# Patient Record
Sex: Female | Born: 1940 | Race: White | Hispanic: No | Marital: Married | Smoking: Former smoker
Health system: Southern US, Community
[De-identification: ages and names within clinical notes are randomized; demographics above are authoritative.]

## PROBLEM LIST (undated history)

## (undated) DIAGNOSIS — J449 Chronic obstructive pulmonary disease, unspecified: Secondary | ICD-10-CM

## (undated) DIAGNOSIS — J189 Pneumonia, unspecified organism: Secondary | ICD-10-CM

## (undated) DIAGNOSIS — E860 Dehydration: Secondary | ICD-10-CM

## (undated) DIAGNOSIS — Z8619 Personal history of other infectious and parasitic diseases: Secondary | ICD-10-CM

## (undated) DIAGNOSIS — I00 Rheumatic fever without heart involvement: Secondary | ICD-10-CM

## (undated) DIAGNOSIS — A4902 Methicillin resistant Staphylococcus aureus infection, unspecified site: Secondary | ICD-10-CM

## (undated) DIAGNOSIS — I82409 Acute embolism and thrombosis of unspecified deep veins of unspecified lower extremity: Secondary | ICD-10-CM

## (undated) DIAGNOSIS — E079 Disorder of thyroid, unspecified: Secondary | ICD-10-CM

## (undated) DIAGNOSIS — Z22322 Carrier or suspected carrier of Methicillin resistant Staphylococcus aureus: Secondary | ICD-10-CM

## (undated) DIAGNOSIS — C189 Malignant neoplasm of colon, unspecified: Secondary | ICD-10-CM

## (undated) DIAGNOSIS — M1990 Unspecified osteoarthritis, unspecified site: Secondary | ICD-10-CM

## (undated) DIAGNOSIS — I2699 Other pulmonary embolism without acute cor pulmonale: Secondary | ICD-10-CM

## (undated) DIAGNOSIS — I10 Essential (primary) hypertension: Secondary | ICD-10-CM

## (undated) HISTORY — DX: Carrier or suspected carrier of methicillin resistant Staphylococcus aureus: Z22.322

## (undated) HISTORY — PX: CHOLECYSTECTOMY: SHX55

## (undated) HISTORY — DX: Rheumatic fever without heart involvement: I00

## (undated) HISTORY — PX: PARTIAL HYSTERECTOMY: SHX80

## (undated) HISTORY — DX: Methicillin resistant Staphylococcus aureus infection, unspecified site: A49.02

## (undated) HISTORY — DX: Acute embolism and thrombosis of unspecified deep veins of unspecified lower extremity: I82.409

## (undated) HISTORY — PX: BACK SURGERY: SHX140

## (undated) HISTORY — PX: OTHER SURGICAL HISTORY: SHX169

## (undated) HISTORY — DX: Personal history of other infectious and parasitic diseases: Z86.19

## (undated) HISTORY — PX: PORT-A-CATH REMOVAL: SHX5289

## (undated) HISTORY — DX: Malignant neoplasm of colon, unspecified: C18.9

## (undated) HISTORY — DX: Other pulmonary embolism without acute cor pulmonale: I26.99

## (undated) HISTORY — PX: BREAST CYST EXCISION: SHX579

## (undated) HISTORY — PX: ARTHROSCOPIC REPAIR ACL: SUR80

## (undated) HISTORY — DX: Chronic obstructive pulmonary disease, unspecified: J44.9

## (undated) HISTORY — DX: Disorder of thyroid, unspecified: E07.9

## (undated) HISTORY — PX: ABCESS DRAINAGE: SHX399

## (undated) HISTORY — DX: Pneumonia, unspecified organism: J18.9

## (undated) HISTORY — PX: GALLBLADDER SURGERY: SHX652

## (undated) HISTORY — DX: Dehydration: E86.0

## (undated) HISTORY — DX: Essential (primary) hypertension: I10

## (undated) HISTORY — DX: Unspecified osteoarthritis, unspecified site: M19.90

## (undated) HISTORY — PX: BILE DUCT EXPLORATION: SHX1225

## (undated) HISTORY — PX: HERNIA REPAIR: SHX51

## (undated) HISTORY — PX: COLOSTOMY TAKEDOWN: SHX5258

---

## 2000-08-26 ENCOUNTER — Encounter: Payer: Self-pay | Admitting: Orthopaedic Surgery

## 2000-08-26 ENCOUNTER — Ambulatory Visit (HOSPITAL_COMMUNITY): Admission: RE | Admit: 2000-08-26 | Discharge: 2000-08-26 | Payer: Self-pay | Admitting: Orthopaedic Surgery

## 2001-03-15 ENCOUNTER — Ambulatory Visit (HOSPITAL_COMMUNITY): Admission: RE | Admit: 2001-03-15 | Discharge: 2001-03-15 | Payer: Self-pay | Admitting: Internal Medicine

## 2001-03-15 ENCOUNTER — Encounter (INDEPENDENT_AMBULATORY_CARE_PROVIDER_SITE_OTHER): Payer: Self-pay | Admitting: Internal Medicine

## 2001-03-25 ENCOUNTER — Ambulatory Visit (HOSPITAL_COMMUNITY): Admission: RE | Admit: 2001-03-25 | Discharge: 2001-03-25 | Payer: Self-pay | Admitting: Internal Medicine

## 2001-03-25 ENCOUNTER — Encounter (INDEPENDENT_AMBULATORY_CARE_PROVIDER_SITE_OTHER): Payer: Self-pay | Admitting: Internal Medicine

## 2004-10-02 ENCOUNTER — Ambulatory Visit: Payer: Self-pay | Admitting: Internal Medicine

## 2004-10-06 ENCOUNTER — Ambulatory Visit (HOSPITAL_COMMUNITY): Admission: RE | Admit: 2004-10-06 | Discharge: 2004-10-06 | Payer: Self-pay | Admitting: Internal Medicine

## 2004-11-05 ENCOUNTER — Ambulatory Visit: Payer: Self-pay | Admitting: Internal Medicine

## 2005-05-05 ENCOUNTER — Ambulatory Visit: Payer: Self-pay | Admitting: Internal Medicine

## 2005-08-12 ENCOUNTER — Ambulatory Visit: Payer: Self-pay | Admitting: Internal Medicine

## 2005-10-19 ENCOUNTER — Encounter (INDEPENDENT_AMBULATORY_CARE_PROVIDER_SITE_OTHER): Payer: Self-pay | Admitting: Specialist

## 2005-10-19 ENCOUNTER — Ambulatory Visit: Payer: Self-pay | Admitting: Cardiology

## 2005-10-19 ENCOUNTER — Ambulatory Visit: Payer: Self-pay | Admitting: Internal Medicine

## 2005-10-19 ENCOUNTER — Inpatient Hospital Stay (HOSPITAL_COMMUNITY): Admission: EM | Admit: 2005-10-19 | Discharge: 2005-10-29 | Payer: Self-pay | Admitting: Emergency Medicine

## 2005-10-26 ENCOUNTER — Ambulatory Visit: Payer: Self-pay | Admitting: Oncology

## 2005-11-20 ENCOUNTER — Ambulatory Visit (HOSPITAL_COMMUNITY): Payer: Self-pay | Admitting: Oncology

## 2005-11-20 ENCOUNTER — Encounter: Admission: RE | Admit: 2005-11-20 | Discharge: 2005-11-20 | Payer: Self-pay | Admitting: Oncology

## 2005-12-03 ENCOUNTER — Ambulatory Visit: Payer: Self-pay | Admitting: Internal Medicine

## 2005-12-17 ENCOUNTER — Inpatient Hospital Stay (HOSPITAL_COMMUNITY): Admission: EM | Admit: 2005-12-17 | Discharge: 2005-12-21 | Payer: Self-pay | Admitting: Emergency Medicine

## 2005-12-17 ENCOUNTER — Ambulatory Visit: Payer: Self-pay | Admitting: Gastroenterology

## 2005-12-30 ENCOUNTER — Ambulatory Visit: Payer: Self-pay | Admitting: Internal Medicine

## 2006-01-01 ENCOUNTER — Encounter (HOSPITAL_COMMUNITY): Admission: RE | Admit: 2006-01-01 | Discharge: 2006-01-18 | Payer: Self-pay | Admitting: Oncology

## 2006-01-20 ENCOUNTER — Ambulatory Visit (HOSPITAL_COMMUNITY): Admission: RE | Admit: 2006-01-20 | Discharge: 2006-01-20 | Payer: Self-pay | Admitting: General Surgery

## 2006-01-28 ENCOUNTER — Ambulatory Visit: Payer: Self-pay | Admitting: Internal Medicine

## 2006-01-29 ENCOUNTER — Ambulatory Visit (HOSPITAL_COMMUNITY): Payer: Self-pay | Admitting: Oncology

## 2006-01-29 ENCOUNTER — Encounter (HOSPITAL_COMMUNITY): Admission: RE | Admit: 2006-01-29 | Discharge: 2006-02-28 | Payer: Self-pay | Admitting: Oncology

## 2006-03-08 ENCOUNTER — Encounter (HOSPITAL_COMMUNITY): Admission: RE | Admit: 2006-03-08 | Discharge: 2006-04-07 | Payer: Self-pay | Admitting: Oncology

## 2006-03-11 ENCOUNTER — Ambulatory Visit: Payer: Self-pay | Admitting: Internal Medicine

## 2006-03-11 ENCOUNTER — Inpatient Hospital Stay (HOSPITAL_COMMUNITY): Admission: EM | Admit: 2006-03-11 | Discharge: 2006-03-18 | Payer: Self-pay | Admitting: Emergency Medicine

## 2006-03-15 ENCOUNTER — Ambulatory Visit: Payer: Self-pay | Admitting: Internal Medicine

## 2006-03-15 ENCOUNTER — Encounter (INDEPENDENT_AMBULATORY_CARE_PROVIDER_SITE_OTHER): Payer: Self-pay | Admitting: *Deleted

## 2006-03-16 ENCOUNTER — Ambulatory Visit: Payer: Self-pay | Admitting: Oncology

## 2006-03-24 ENCOUNTER — Ambulatory Visit: Payer: Self-pay | Admitting: Internal Medicine

## 2006-04-07 ENCOUNTER — Encounter (HOSPITAL_COMMUNITY): Admission: RE | Admit: 2006-04-07 | Discharge: 2006-05-07 | Payer: Self-pay | Admitting: Oncology

## 2006-04-08 ENCOUNTER — Ambulatory Visit (HOSPITAL_COMMUNITY): Payer: Self-pay | Admitting: Oncology

## 2006-05-20 ENCOUNTER — Encounter (HOSPITAL_COMMUNITY): Admission: RE | Admit: 2006-05-20 | Discharge: 2006-06-19 | Payer: Self-pay | Admitting: Oncology

## 2006-05-26 ENCOUNTER — Ambulatory Visit (HOSPITAL_COMMUNITY): Payer: Self-pay | Admitting: Oncology

## 2006-06-03 ENCOUNTER — Ambulatory Visit: Payer: Self-pay | Admitting: Internal Medicine

## 2006-06-24 ENCOUNTER — Encounter (HOSPITAL_COMMUNITY): Admission: RE | Admit: 2006-06-24 | Discharge: 2006-07-24 | Payer: Self-pay | Admitting: Oncology

## 2006-07-16 ENCOUNTER — Ambulatory Visit (HOSPITAL_COMMUNITY): Payer: Self-pay | Admitting: Oncology

## 2006-09-10 ENCOUNTER — Ambulatory Visit (HOSPITAL_COMMUNITY): Payer: Self-pay | Admitting: Oncology

## 2006-10-28 ENCOUNTER — Ambulatory Visit: Payer: Self-pay | Admitting: Cardiovascular Disease

## 2006-11-01 ENCOUNTER — Ambulatory Visit: Payer: Self-pay | Admitting: Internal Medicine

## 2006-11-01 ENCOUNTER — Encounter (HOSPITAL_COMMUNITY): Admission: RE | Admit: 2006-11-01 | Discharge: 2006-12-01 | Payer: Self-pay | Admitting: Obstetrics and Gynecology

## 2006-12-02 ENCOUNTER — Ambulatory Visit (HOSPITAL_COMMUNITY): Payer: Self-pay | Admitting: Oncology

## 2007-01-05 ENCOUNTER — Encounter (HOSPITAL_COMMUNITY): Admission: RE | Admit: 2007-01-05 | Discharge: 2007-01-19 | Payer: Self-pay | Admitting: Oncology

## 2007-01-24 ENCOUNTER — Ambulatory Visit (HOSPITAL_COMMUNITY): Admission: RE | Admit: 2007-01-24 | Discharge: 2007-01-24 | Payer: Self-pay | Admitting: Oncology

## 2007-02-16 ENCOUNTER — Ambulatory Visit (HOSPITAL_COMMUNITY): Payer: Self-pay | Admitting: Oncology

## 2007-03-30 ENCOUNTER — Encounter (HOSPITAL_COMMUNITY): Admission: RE | Admit: 2007-03-30 | Discharge: 2007-04-29 | Payer: Self-pay | Admitting: Oncology

## 2007-05-11 ENCOUNTER — Ambulatory Visit (HOSPITAL_COMMUNITY): Payer: Self-pay | Admitting: Oncology

## 2007-06-24 ENCOUNTER — Encounter (HOSPITAL_COMMUNITY): Admission: RE | Admit: 2007-06-24 | Discharge: 2007-07-24 | Payer: Self-pay | Admitting: Oncology

## 2007-07-20 ENCOUNTER — Ambulatory Visit (HOSPITAL_COMMUNITY): Payer: Self-pay | Admitting: Oncology

## 2007-08-09 ENCOUNTER — Ambulatory Visit (HOSPITAL_COMMUNITY): Admission: RE | Admit: 2007-08-09 | Discharge: 2007-08-09 | Payer: Self-pay | Admitting: General Surgery

## 2007-08-11 ENCOUNTER — Inpatient Hospital Stay (HOSPITAL_COMMUNITY): Admission: RE | Admit: 2007-08-11 | Discharge: 2007-08-30 | Payer: Self-pay | Admitting: General Surgery

## 2007-08-11 ENCOUNTER — Encounter (INDEPENDENT_AMBULATORY_CARE_PROVIDER_SITE_OTHER): Payer: Self-pay | Admitting: General Surgery

## 2007-09-07 ENCOUNTER — Inpatient Hospital Stay (HOSPITAL_COMMUNITY): Admission: EM | Admit: 2007-09-07 | Discharge: 2007-09-10 | Payer: Self-pay | Admitting: Emergency Medicine

## 2007-09-07 ENCOUNTER — Encounter: Payer: Self-pay | Admitting: Emergency Medicine

## 2007-09-18 ENCOUNTER — Ambulatory Visit: Payer: Self-pay | Admitting: Pulmonary Disease

## 2007-09-18 ENCOUNTER — Inpatient Hospital Stay (HOSPITAL_COMMUNITY): Admission: EM | Admit: 2007-09-18 | Discharge: 2007-10-17 | Payer: Self-pay | Admitting: General Surgery

## 2007-09-18 ENCOUNTER — Encounter: Payer: Self-pay | Admitting: Emergency Medicine

## 2007-10-24 ENCOUNTER — Ambulatory Visit: Payer: Self-pay | Admitting: Internal Medicine

## 2007-10-24 ENCOUNTER — Inpatient Hospital Stay (HOSPITAL_COMMUNITY): Admission: EM | Admit: 2007-10-24 | Discharge: 2007-11-11 | Payer: Self-pay | Admitting: Emergency Medicine

## 2007-10-25 ENCOUNTER — Encounter (INDEPENDENT_AMBULATORY_CARE_PROVIDER_SITE_OTHER): Payer: Self-pay | Admitting: General Surgery

## 2007-10-28 ENCOUNTER — Encounter (INDEPENDENT_AMBULATORY_CARE_PROVIDER_SITE_OTHER): Payer: Self-pay | Admitting: Internal Medicine

## 2008-01-23 ENCOUNTER — Ambulatory Visit (HOSPITAL_COMMUNITY): Payer: Self-pay | Admitting: Oncology

## 2008-01-23 ENCOUNTER — Encounter (HOSPITAL_COMMUNITY): Admission: RE | Admit: 2008-01-23 | Discharge: 2008-02-22 | Payer: Self-pay | Admitting: Oncology

## 2008-04-16 ENCOUNTER — Encounter (HOSPITAL_COMMUNITY): Admission: RE | Admit: 2008-04-16 | Discharge: 2008-05-17 | Payer: Self-pay | Admitting: Oncology

## 2008-04-16 ENCOUNTER — Ambulatory Visit (HOSPITAL_COMMUNITY): Payer: Self-pay | Admitting: Oncology

## 2008-06-29 ENCOUNTER — Ambulatory Visit (HOSPITAL_COMMUNITY): Admission: RE | Admit: 2008-06-29 | Discharge: 2008-06-29 | Payer: Self-pay | Admitting: Family Medicine

## 2008-07-09 ENCOUNTER — Ambulatory Visit (HOSPITAL_COMMUNITY): Payer: Self-pay | Admitting: Oncology

## 2008-07-09 ENCOUNTER — Encounter (HOSPITAL_COMMUNITY): Admission: RE | Admit: 2008-07-09 | Discharge: 2008-08-08 | Payer: Self-pay | Admitting: Oncology

## 2008-10-01 ENCOUNTER — Ambulatory Visit (HOSPITAL_COMMUNITY): Payer: Self-pay | Admitting: Oncology

## 2008-10-01 ENCOUNTER — Encounter (HOSPITAL_COMMUNITY): Admission: RE | Admit: 2008-10-01 | Discharge: 2008-10-17 | Payer: Self-pay | Admitting: Oncology

## 2008-12-24 ENCOUNTER — Ambulatory Visit (HOSPITAL_COMMUNITY): Admission: RE | Admit: 2008-12-24 | Discharge: 2008-12-24 | Payer: Self-pay | Admitting: General Surgery

## 2008-12-27 ENCOUNTER — Ambulatory Visit (HOSPITAL_COMMUNITY): Payer: Self-pay | Admitting: Oncology

## 2008-12-27 ENCOUNTER — Encounter (HOSPITAL_COMMUNITY): Admission: RE | Admit: 2008-12-27 | Discharge: 2009-01-15 | Payer: Self-pay | Admitting: Oncology

## 2009-03-21 ENCOUNTER — Ambulatory Visit (HOSPITAL_COMMUNITY): Payer: Self-pay | Admitting: Oncology

## 2009-03-21 ENCOUNTER — Encounter (HOSPITAL_COMMUNITY): Admission: RE | Admit: 2009-03-21 | Discharge: 2009-04-20 | Payer: Self-pay | Admitting: Oncology

## 2009-04-24 ENCOUNTER — Ambulatory Visit (HOSPITAL_COMMUNITY): Admission: RE | Admit: 2009-04-24 | Discharge: 2009-04-24 | Payer: Self-pay | Admitting: Internal Medicine

## 2009-06-13 ENCOUNTER — Encounter (HOSPITAL_COMMUNITY): Admission: RE | Admit: 2009-06-13 | Discharge: 2009-07-13 | Payer: Self-pay | Admitting: Oncology

## 2009-06-13 ENCOUNTER — Ambulatory Visit (HOSPITAL_COMMUNITY): Payer: Self-pay | Admitting: Oncology

## 2009-08-05 ENCOUNTER — Ambulatory Visit (HOSPITAL_COMMUNITY): Payer: Self-pay | Admitting: Oncology

## 2009-08-05 ENCOUNTER — Encounter (HOSPITAL_COMMUNITY)
Admission: RE | Admit: 2009-08-05 | Discharge: 2009-09-04 | Payer: Self-pay | Source: Home / Self Care | Admitting: Oncology

## 2009-08-23 ENCOUNTER — Ambulatory Visit (HOSPITAL_COMMUNITY): Admission: RE | Admit: 2009-08-23 | Discharge: 2009-08-23 | Payer: Self-pay | Admitting: Oncology

## 2009-09-06 ENCOUNTER — Encounter (HOSPITAL_COMMUNITY): Admission: RE | Admit: 2009-09-06 | Discharge: 2009-10-06 | Payer: Self-pay | Admitting: Oncology

## 2009-09-16 ENCOUNTER — Inpatient Hospital Stay (HOSPITAL_COMMUNITY): Admission: AD | Admit: 2009-09-16 | Discharge: 2009-09-19 | Payer: Self-pay

## 2009-10-17 IMAGING — CT CT ABDOMEN W/O CM
1 of 2 series · 15 of 32 positions shown, 19 images · non-contrast
Comparison: 09/18/2007

CT ABDOMEN

CLINICAL DATA: Postoperative abdominal pain.  Nausea and vomiting.
Evaluate for abscess or cutaneous fistula

CT ABDOMEN AND PELVIS WITHOUT CONTRAST
TECHNIQUE: Multidetector CT imaging of the abdomen and pelvis was
performed following the standard
protocol without intravenous contrast.

[Series 2: abd_pel 5.0 b40f st · axial · 0.68mm/px · z∈[-395,-5]mm · 15 of 86 slices shown, 19 images]
[im 4/86  soft-tissue]
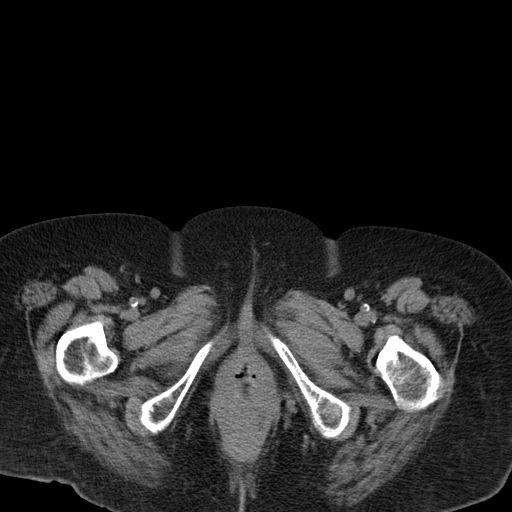
[im 4/86  bone]
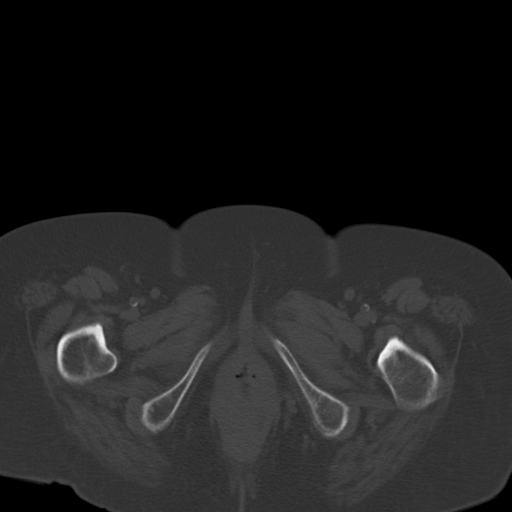
[im 11/86  soft-tissue]
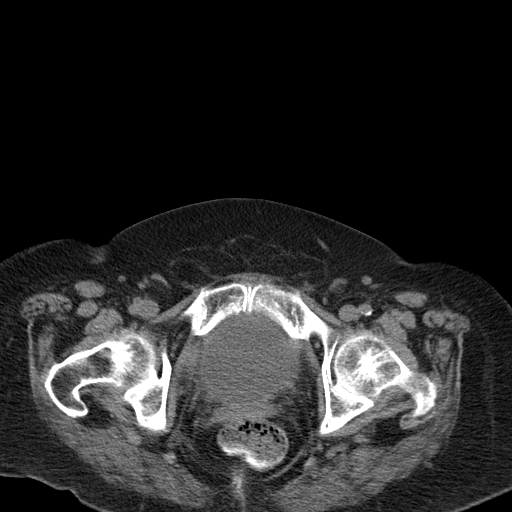
[im 18/86  soft-tissue]
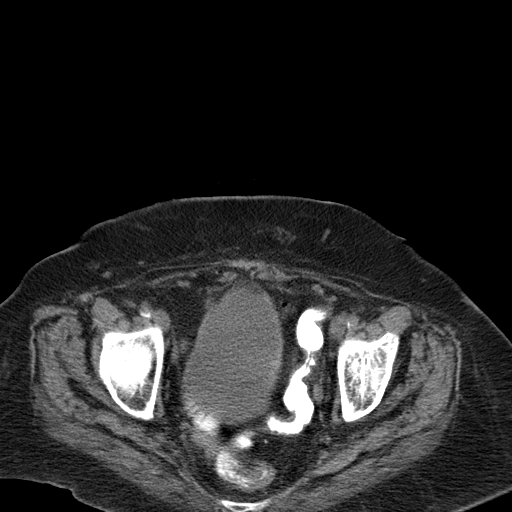
[im 24/86  soft-tissue]
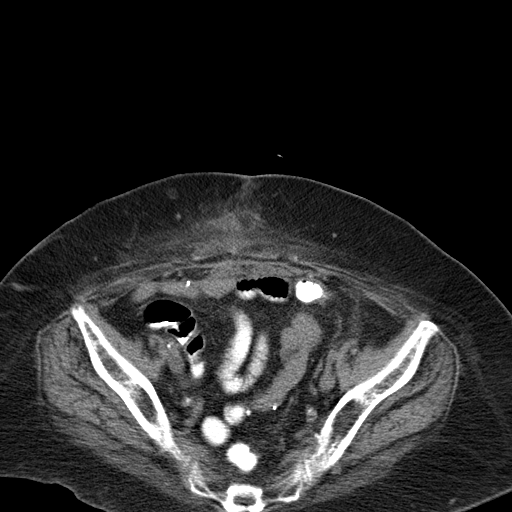
[im 31/86  soft-tissue]
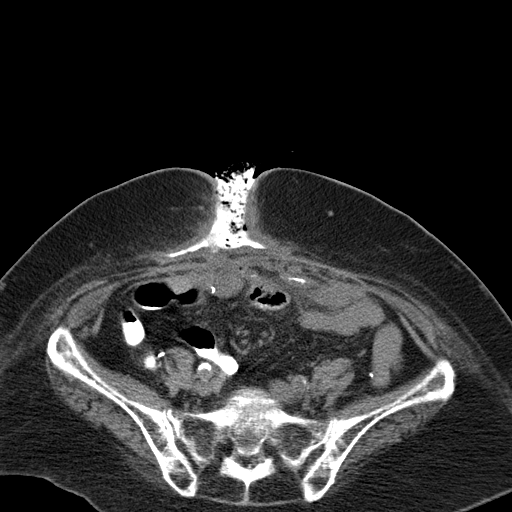
[im 38/86  soft-tissue]
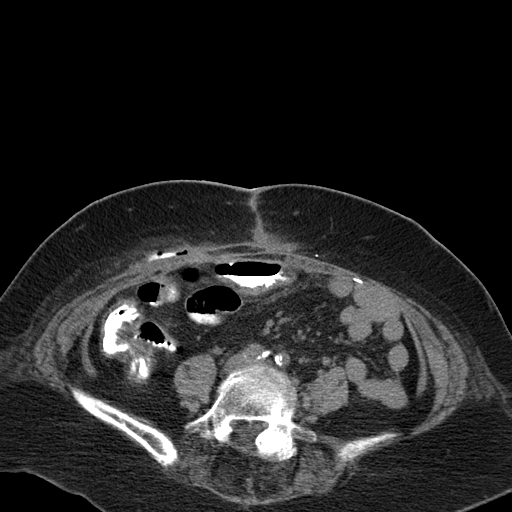
[im 45/86  soft-tissue]
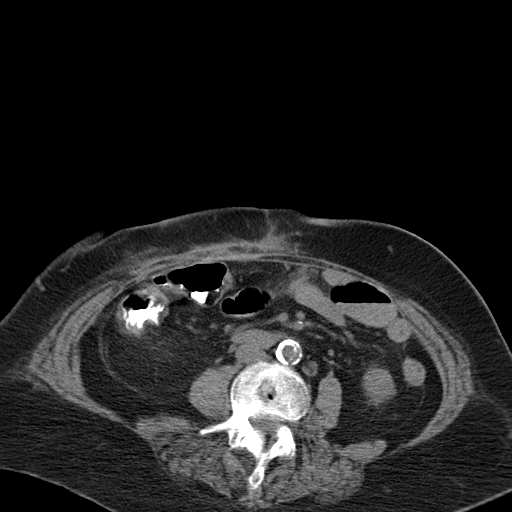
[im 48/86  soft-tissue]
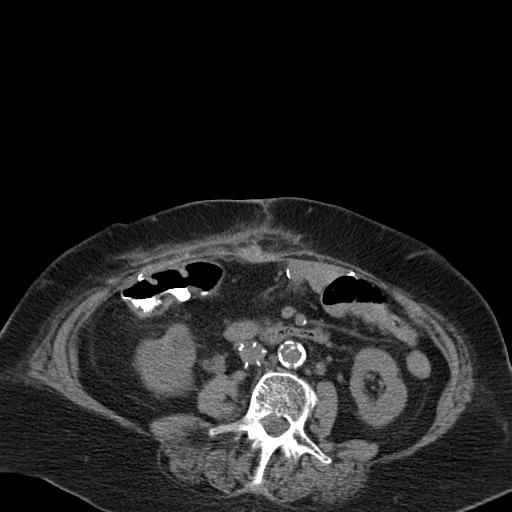
[im 55/86  soft-tissue]
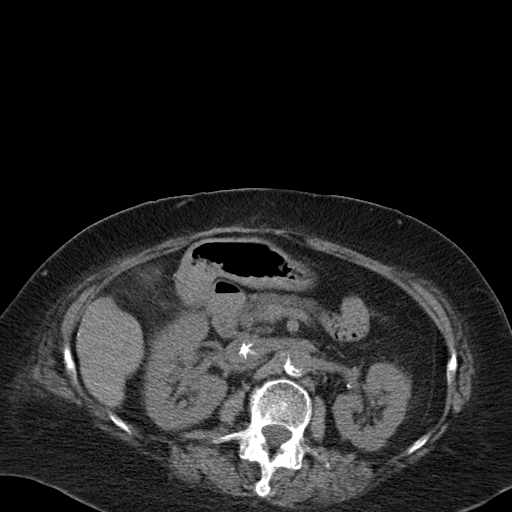
[im 55/86  bone]
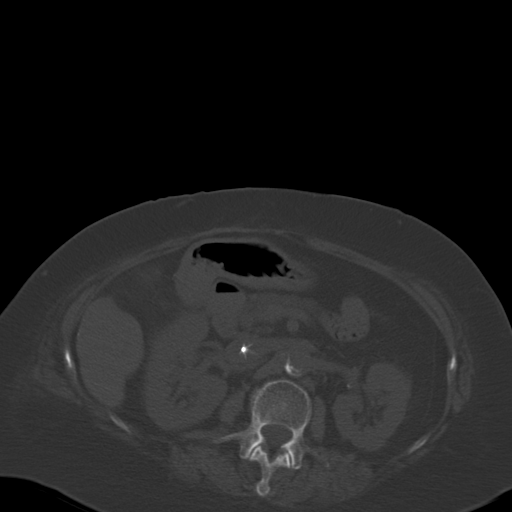
[im 62/86  soft-tissue]
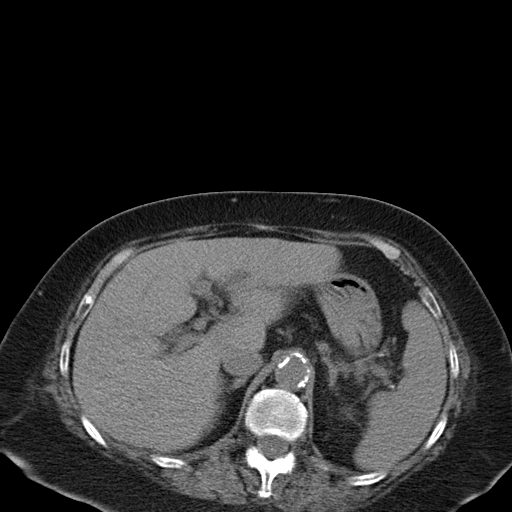
[im 69/86  soft-tissue]
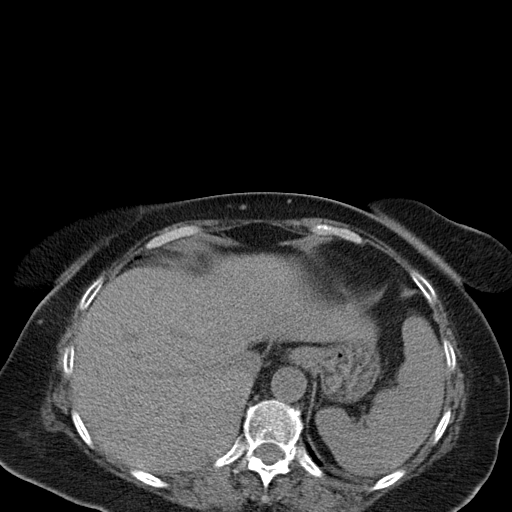
[im 72/86  lung]
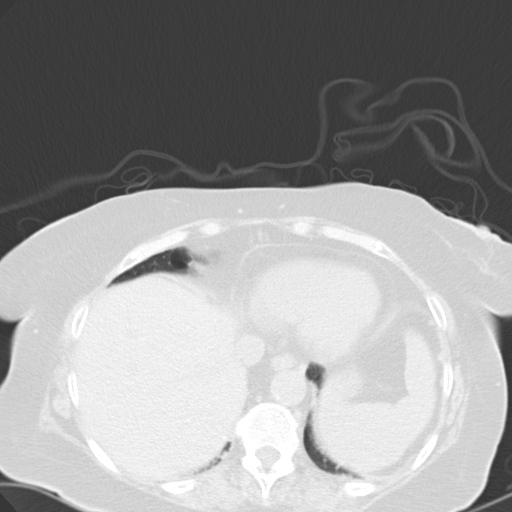
[im 75/86  soft-tissue]
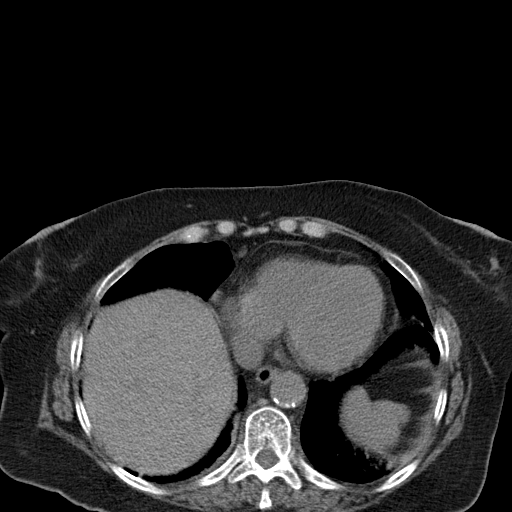
[im 75/86  lung]
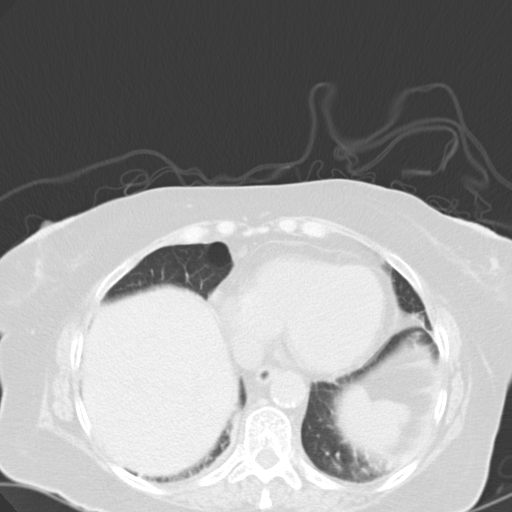
[im 79/86  lung]
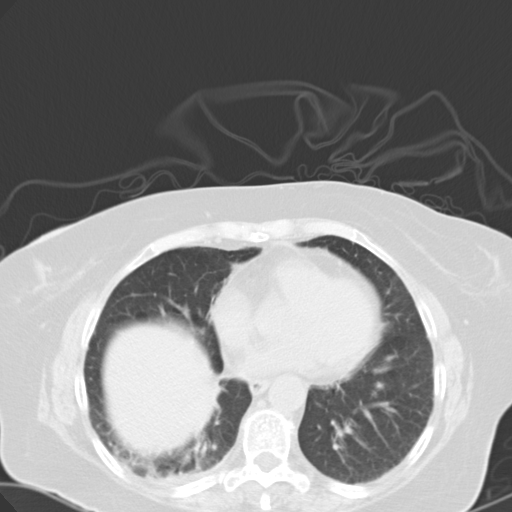
[im 82/86  soft-tissue]
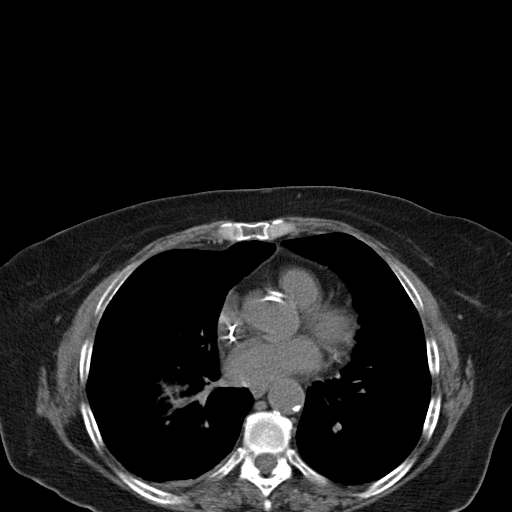
[im 82/86  lung]
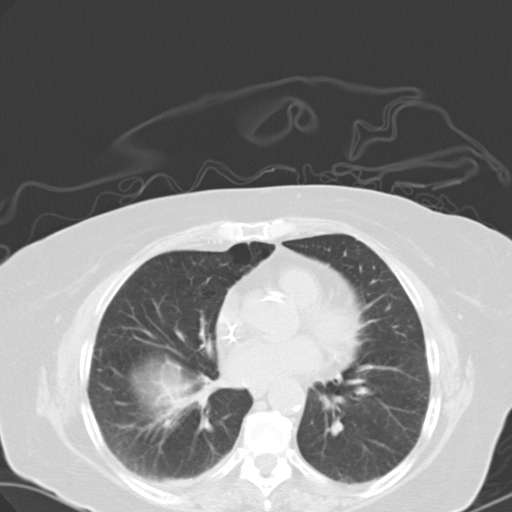

[15 of 32 positions shown; findings below may reference images not displayed]

FINDINGS: The lung bases are clear except for dependent
atelectasis.  The unenhanced appearance of the liver and spleen are
unremarkable and stable.  Stable CT appearance of the pancreas with
atrophy and calcifications.  The adrenal glands and kidneys are
stable with mild hydronephrosis on the right side due to a mild UPJ
obstruction.  An IVC filter is again demonstrated.

The stomach, duodenum, small bowel and colon grossly normal.
IMPRESSION: 1.  Postoperative changes in the abdomen but no acute intra
abdominal findings.

CT PELVIS
FINDINGS: There is a persistent colocutaneous fistula.  This
appears to be coming from the region of the upper sigmoid colon.
It appears to be and chest of the left of midline courses across
the midline and be kept with a large contrast collection at the
level of the umbilicus.  No intra pelvic abscess.  The rectum and
lower sigmoid colon are normal.  Bladder appears normal.
IMPRESSION: 1.  Persistent colocutaneous fistula.
2.  No intrapelvic abscess.

## 2009-10-29 ENCOUNTER — Encounter (HOSPITAL_COMMUNITY)
Admission: RE | Admit: 2009-10-29 | Discharge: 2009-11-28 | Payer: Self-pay | Source: Home / Self Care | Admitting: Oncology

## 2009-10-29 ENCOUNTER — Ambulatory Visit (HOSPITAL_COMMUNITY): Payer: Self-pay | Admitting: Oncology

## 2009-11-05 IMAGING — CR DG CHEST 2V
2 series · 2 of 2 positions shown · non-contrast
Comparison: 09/22/2007

CLINICAL DATA: Nausea, vomiting and fever.  Shortness of breath.

CHEST - 2 VIEW

[w chest lat]
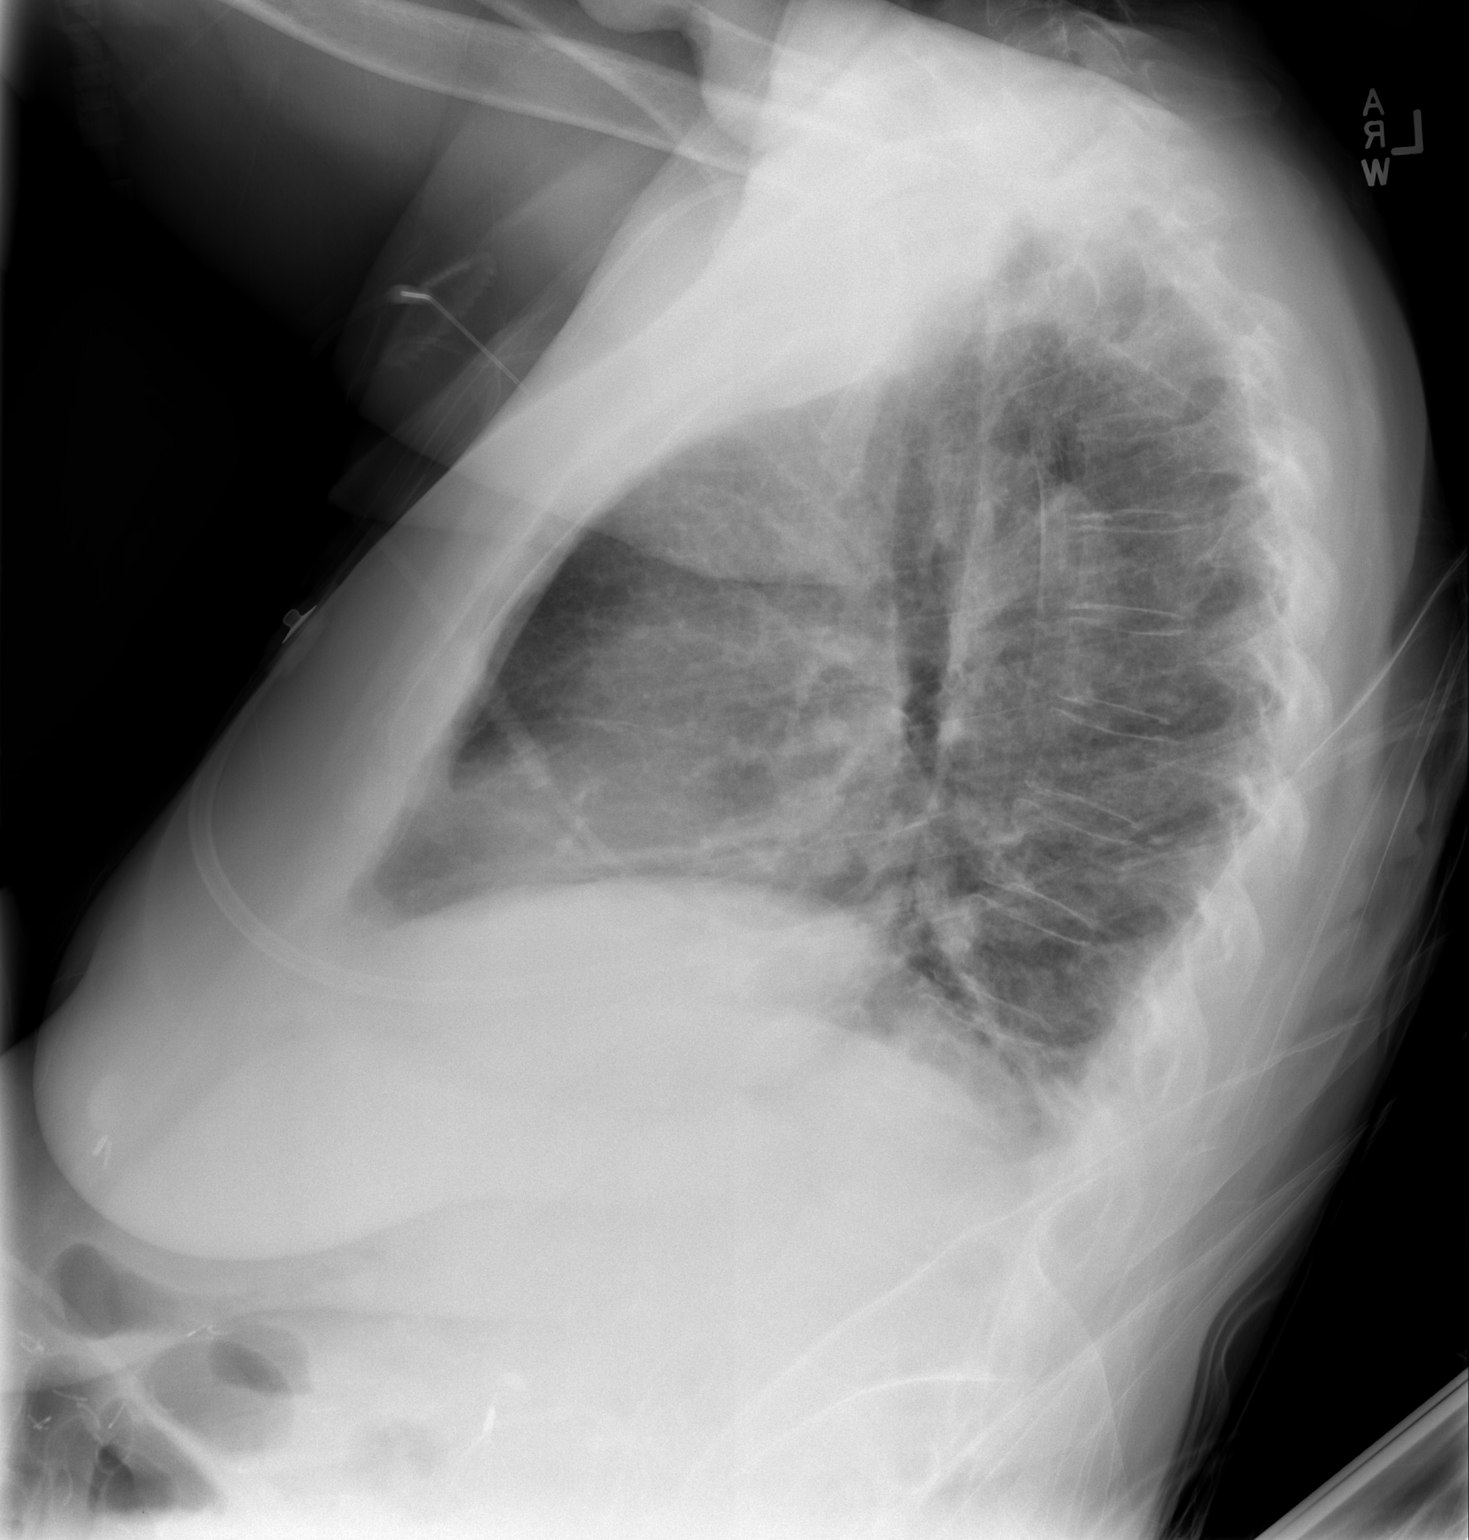

[view not recorded]
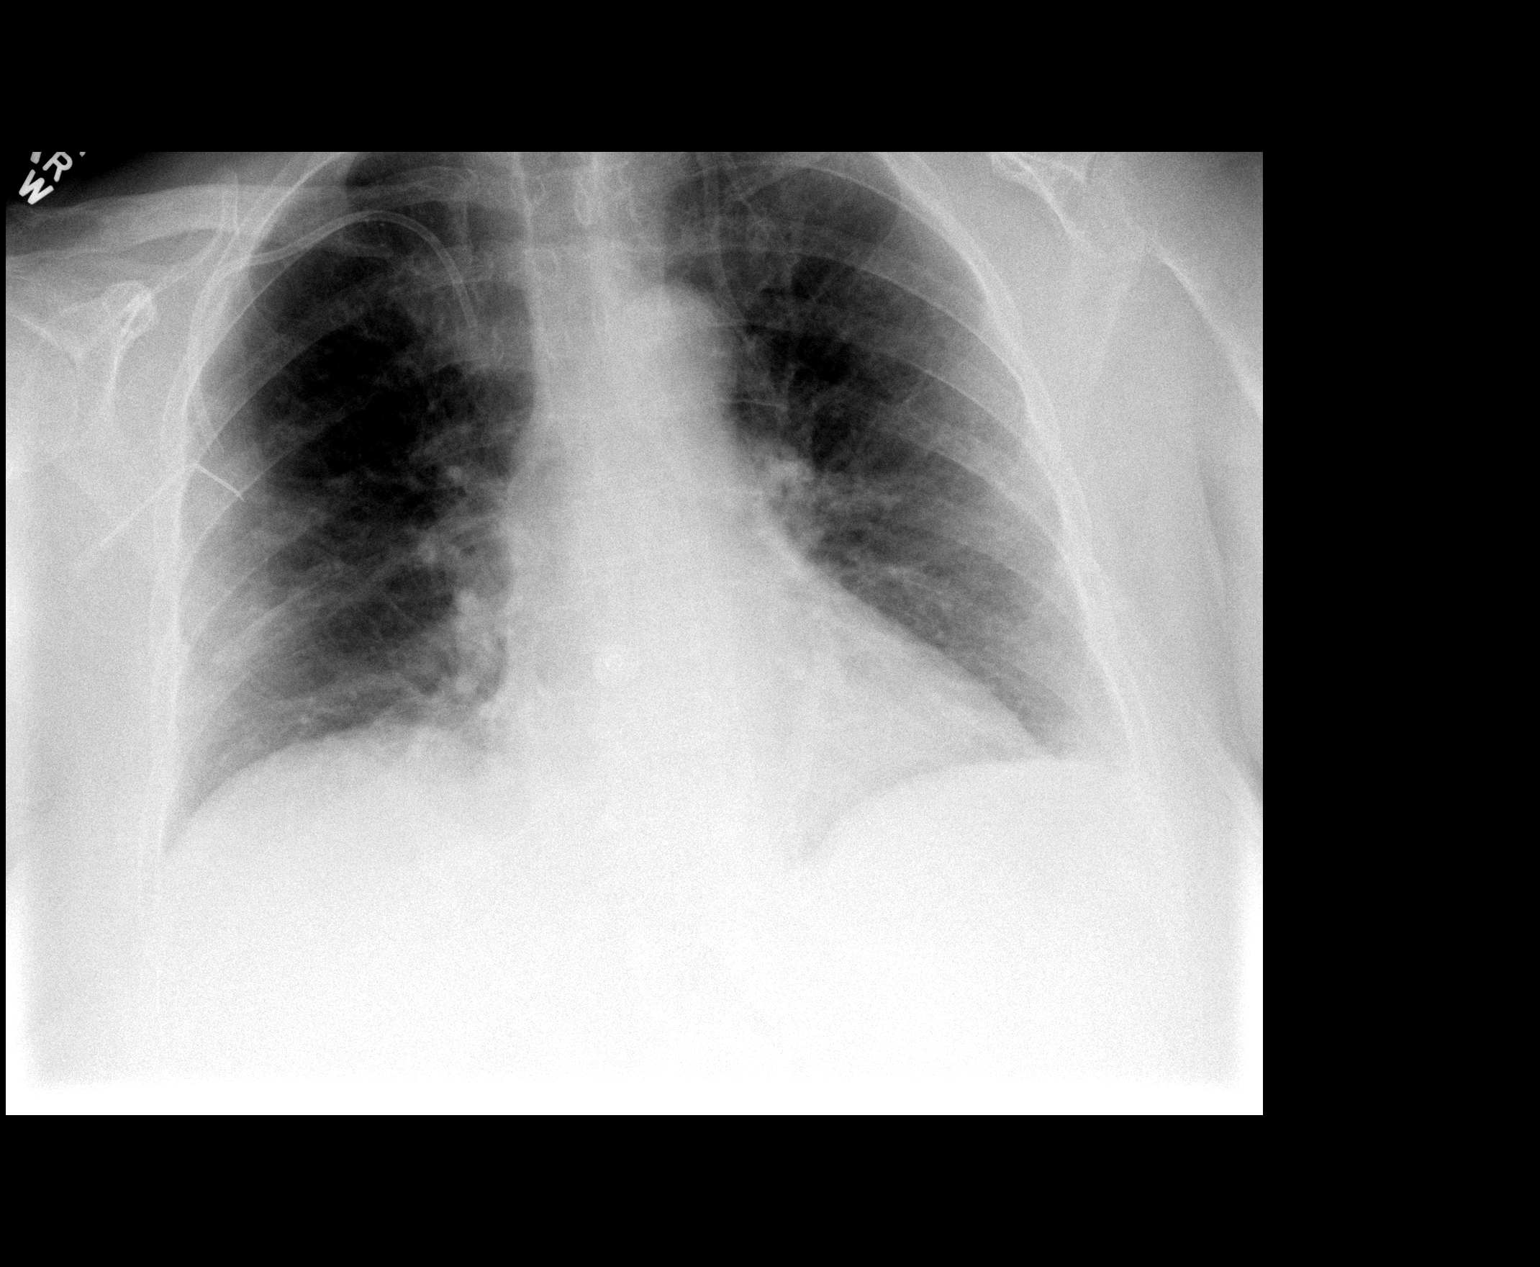

[2 of 2 positions shown; findings below may reference images not displayed]

FINDINGS: Trachea is midline.  Heart size stable.  Lungs are
somewhat low in volume with mild right basilar atelectasis.  No
definite pleural fluid.
IMPRESSION: Mild right bibasilar atelectasis.

## 2009-12-09 ENCOUNTER — Ambulatory Visit: Payer: Self-pay | Admitting: Internal Medicine

## 2010-01-21 ENCOUNTER — Ambulatory Visit: Admit: 2010-01-21 | Payer: Self-pay | Admitting: Internal Medicine

## 2010-02-08 ENCOUNTER — Other Ambulatory Visit (HOSPITAL_COMMUNITY): Payer: Self-pay | Admitting: Oncology

## 2010-02-08 DIAGNOSIS — M8580 Other specified disorders of bone density and structure, unspecified site: Secondary | ICD-10-CM

## 2010-02-08 DIAGNOSIS — C189 Malignant neoplasm of colon, unspecified: Secondary | ICD-10-CM

## 2010-02-09 ENCOUNTER — Encounter: Payer: Self-pay | Admitting: General Surgery

## 2010-02-09 ENCOUNTER — Encounter (HOSPITAL_COMMUNITY): Payer: Self-pay | Admitting: Oncology

## 2010-02-11 ENCOUNTER — Encounter (HOSPITAL_COMMUNITY)
Admission: RE | Admit: 2010-02-11 | Discharge: 2010-02-18 | Payer: Self-pay | Source: Home / Self Care | Attending: Oncology | Admitting: Oncology

## 2010-02-11 ENCOUNTER — Ambulatory Visit (HOSPITAL_COMMUNITY)
Admission: RE | Admit: 2010-02-11 | Discharge: 2010-02-18 | Payer: Self-pay | Source: Home / Self Care | Attending: Oncology | Admitting: Oncology

## 2010-02-12 LAB — DIFFERENTIAL
Eosinophils Relative: 3 % (ref 0–5)
Lymphocytes Relative: 43 % (ref 12–46)
Monocytes Absolute: 0.5 10*3/uL (ref 0.1–1.0)
Monocytes Relative: 5 % (ref 3–12)
Neutro Abs: 4.7 10*3/uL (ref 1.7–7.7)

## 2010-02-12 LAB — CBC
HCT: 38.8 % (ref 36.0–46.0)
Hemoglobin: 12.5 g/dL (ref 12.0–15.0)
MCH: 27.9 pg (ref 26.0–34.0)
MCHC: 32.2 g/dL (ref 30.0–36.0)
MCV: 86.6 fL (ref 78.0–100.0)
RDW: 16.4 % — ABNORMAL HIGH (ref 11.5–15.5)

## 2010-02-12 LAB — CEA: CEA: 3.5 ng/mL (ref 0.0–5.0)

## 2010-02-12 LAB — COMPREHENSIVE METABOLIC PANEL
ALT: 15 U/L (ref 0–35)
BUN: 21 mg/dL (ref 6–23)
CO2: 27 mEq/L (ref 19–32)
Calcium: 9.3 mg/dL (ref 8.4–10.5)
Creatinine, Ser: 1.28 mg/dL — ABNORMAL HIGH (ref 0.4–1.2)
GFR calc non Af Amer: 41 mL/min — ABNORMAL LOW (ref >60)
Glucose, Bld: 214 mg/dL — ABNORMAL HIGH (ref 70–99)
Total Protein: 7.9 g/dL (ref 6.0–8.3)

## 2010-02-19 DIAGNOSIS — Z8619 Personal history of other infectious and parasitic diseases: Secondary | ICD-10-CM

## 2010-02-19 HISTORY — DX: Personal history of other infectious and parasitic diseases: Z86.19

## 2010-04-03 LAB — GLUCOSE, CAPILLARY
Glucose-Capillary: 197 mg/dL — ABNORMAL HIGH (ref 70–99)
Glucose-Capillary: 222 mg/dL — ABNORMAL HIGH (ref 70–99)

## 2010-04-03 LAB — CEA: CEA: 3.4 ng/mL (ref 0.0–5.0)

## 2010-04-04 LAB — MRSA PCR SCREENING: MRSA by PCR: POSITIVE — AB

## 2010-04-04 LAB — CULTURE, ROUTINE-ABSCESS: Gram Stain: NONE SEEN

## 2010-04-04 LAB — ANAEROBIC CULTURE

## 2010-04-04 LAB — BASIC METABOLIC PANEL
BUN: 15 mg/dL (ref 6–23)
Creatinine, Ser: 1.43 mg/dL — ABNORMAL HIGH (ref 0.4–1.2)
GFR calc non Af Amer: 36 mL/min — ABNORMAL LOW (ref >60)
Glucose, Bld: 335 mg/dL — ABNORMAL HIGH (ref 70–99)

## 2010-04-04 LAB — TYPE AND SCREEN
ABO/RH(D): O POS
Antibody Screen: NEGATIVE

## 2010-04-04 LAB — GLUCOSE, CAPILLARY
Glucose-Capillary: 194 mg/dL — ABNORMAL HIGH (ref 70–99)
Glucose-Capillary: 200 mg/dL — ABNORMAL HIGH (ref 70–99)
Glucose-Capillary: 215 mg/dL — ABNORMAL HIGH (ref 70–99)
Glucose-Capillary: 224 mg/dL — ABNORMAL HIGH (ref 70–99)
Glucose-Capillary: 232 mg/dL — ABNORMAL HIGH (ref 70–99)
Glucose-Capillary: 86 mg/dL (ref 70–99)

## 2010-04-04 LAB — CBC
HCT: 36.4 % (ref 36.0–46.0)
MCHC: 32.4 g/dL (ref 30.0–36.0)
MCV: 86.7 fL (ref 78.0–100.0)
Platelets: 323 10*3/uL (ref 150–400)
RDW: 16.5 % — ABNORMAL HIGH (ref 11.5–15.5)
WBC: 8.5 10*3/uL (ref 4.0–10.5)

## 2010-04-04 LAB — PROTIME-INR: INR: 1.7 — ABNORMAL HIGH (ref 0.00–1.49)

## 2010-04-05 LAB — CEA: CEA: 3.4 ng/mL (ref 0.0–5.0)

## 2010-04-05 LAB — DIFFERENTIAL
Eosinophils Relative: 3 % (ref 0–5)
Lymphocytes Relative: 37 % (ref 12–46)
Lymphs Abs: 3.6 10*3/uL (ref 0.7–4.0)
Monocytes Relative: 6 % (ref 3–12)

## 2010-04-05 LAB — COMPREHENSIVE METABOLIC PANEL
AST: 21 U/L (ref 0–37)
CO2: 27 mEq/L (ref 19–32)
Calcium: 9.4 mg/dL (ref 8.4–10.5)
Creatinine, Ser: 1.29 mg/dL — ABNORMAL HIGH (ref 0.4–1.2)
GFR calc Af Amer: 49 mL/min — ABNORMAL LOW (ref >60)
GFR calc non Af Amer: 41 mL/min — ABNORMAL LOW (ref >60)
Sodium: 139 mEq/L (ref 135–145)
Total Protein: 7.8 g/dL (ref 6.0–8.3)

## 2010-04-05 LAB — CBC
HCT: 39 % (ref 36.0–46.0)
MCHC: 32.8 g/dL (ref 30.0–36.0)
Platelets: 299 10*3/uL (ref 150–400)
RDW: 17.2 % — ABNORMAL HIGH (ref 11.5–15.5)
WBC: 9.7 10*3/uL (ref 4.0–10.5)

## 2010-04-07 LAB — CEA: CEA: 3 ng/mL (ref 0.0–5.0)

## 2010-04-13 LAB — COMPREHENSIVE METABOLIC PANEL
ALT: 15 U/L (ref 0–35)
Alkaline Phosphatase: 100 U/L (ref 39–117)
CO2: 28 mEq/L (ref 19–32)
GFR calc non Af Amer: 43 mL/min — ABNORMAL LOW (ref >60)
Glucose, Bld: 169 mg/dL — ABNORMAL HIGH (ref 70–99)
Potassium: 4.6 mEq/L (ref 3.5–5.1)
Sodium: 135 mEq/L (ref 135–145)

## 2010-04-13 LAB — DIFFERENTIAL
Basophils Absolute: 0.1 10*3/uL (ref 0.0–0.1)
Basophils Relative: 1 % (ref 0–1)
Eosinophils Absolute: 0.2 10*3/uL (ref 0.0–0.7)
Neutrophils Relative %: 56 % (ref 43–77)

## 2010-04-13 LAB — CBC
HCT: 38.7 % (ref 36.0–46.0)
Hemoglobin: 12.8 g/dL (ref 12.0–15.0)
MCV: 86.6 fL (ref 78.0–100.0)
RDW: 18.3 % — ABNORMAL HIGH (ref 11.5–15.5)
WBC: 10.5 10*3/uL (ref 4.0–10.5)

## 2010-04-25 LAB — CEA: CEA: 2.5 ng/mL (ref 0.0–5.0)

## 2010-04-28 ENCOUNTER — Ambulatory Visit (INDEPENDENT_AMBULATORY_CARE_PROVIDER_SITE_OTHER): Payer: BC Managed Care – PPO | Admitting: Internal Medicine

## 2010-04-28 DIAGNOSIS — Z85038 Personal history of other malignant neoplasm of large intestine: Secondary | ICD-10-CM

## 2010-04-28 DIAGNOSIS — Z7901 Long term (current) use of anticoagulants: Secondary | ICD-10-CM

## 2010-04-28 DIAGNOSIS — E119 Type 2 diabetes mellitus without complications: Secondary | ICD-10-CM

## 2010-04-28 LAB — CBC
HCT: 39.3 % (ref 36.0–46.0)
Hemoglobin: 13.2 g/dL (ref 12.0–15.0)
RBC: 4.77 MIL/uL (ref 3.87–5.11)
RDW: 17.2 % — ABNORMAL HIGH (ref 11.5–15.5)

## 2010-04-28 LAB — COMPREHENSIVE METABOLIC PANEL
ALT: 21 U/L (ref 0–35)
Alkaline Phosphatase: 109 U/L (ref 39–117)
BUN: 20 mg/dL (ref 6–23)
CO2: 26 mEq/L (ref 19–32)
GFR calc non Af Amer: 41 mL/min — ABNORMAL LOW (ref >60)
Glucose, Bld: 115 mg/dL — ABNORMAL HIGH (ref 70–99)
Potassium: 4.6 mEq/L (ref 3.5–5.1)
Sodium: 137 mEq/L (ref 135–145)
Total Bilirubin: 0.5 mg/dL (ref 0.3–1.2)

## 2010-04-28 LAB — DIFFERENTIAL
Basophils Absolute: 0.1 10*3/uL (ref 0.0–0.1)
Basophils Relative: 1 % (ref 0–1)
Eosinophils Absolute: 0.2 10*3/uL (ref 0.0–0.7)
Neutro Abs: 8.6 10*3/uL — ABNORMAL HIGH (ref 1.7–7.7)
Neutrophils Relative %: 58 % (ref 43–77)

## 2010-04-28 LAB — CEA: CEA: 2.3 ng/mL (ref 0.0–5.0)

## 2010-05-01 LAB — CEA: CEA: 2.2 ng/mL (ref 0.0–5.0)

## 2010-05-05 LAB — CBC
MCV: 78.8 fL (ref 78.0–100.0)
Platelets: 332 10*3/uL (ref 150–400)
RBC: 4.71 MIL/uL (ref 3.87–5.11)
WBC: 9.6 10*3/uL (ref 4.0–10.5)

## 2010-05-05 LAB — COMPREHENSIVE METABOLIC PANEL
AST: 32 U/L (ref 0–37)
Albumin: 3.3 g/dL — ABNORMAL LOW (ref 3.5–5.2)
Alkaline Phosphatase: 104 U/L (ref 39–117)
BUN: 24 mg/dL — ABNORMAL HIGH (ref 6–23)
GFR calc Af Amer: 42 mL/min — ABNORMAL LOW (ref >60)
Potassium: 5.5 mEq/L — ABNORMAL HIGH (ref 3.5–5.1)
Sodium: 135 mEq/L (ref 135–145)
Total Protein: 8.4 g/dL — ABNORMAL HIGH (ref 6.0–8.3)

## 2010-05-05 LAB — DIFFERENTIAL
Basophils Relative: 2 % — ABNORMAL HIGH (ref 0–1)
Eosinophils Relative: 3 % (ref 0–5)
Monocytes Absolute: 0.4 10*3/uL (ref 0.1–1.0)
Monocytes Relative: 4 % (ref 3–12)
Neutro Abs: 5 10*3/uL (ref 1.7–7.7)

## 2010-05-05 LAB — CEA: CEA: 2.2 ng/mL (ref 0.0–5.0)

## 2010-05-06 ENCOUNTER — Encounter (HOSPITAL_COMMUNITY): Payer: BC Managed Care – PPO | Attending: Oncology

## 2010-05-06 DIAGNOSIS — Z85038 Personal history of other malignant neoplasm of large intestine: Secondary | ICD-10-CM | POA: Insufficient documentation

## 2010-05-06 DIAGNOSIS — C189 Malignant neoplasm of colon, unspecified: Secondary | ICD-10-CM

## 2010-05-16 NOTE — Consult Note (Signed)
Heather [name], Heather [name]               ACCOUNT NO.:  [id number]  MEDICAL RECORD NO.:  [id number]          PATIENT TYPE: AMB.  LOCATION: [place].                   [facility]  PHYSICIAN:  Lionel December, M.D.    DATE OF BIRTH:  06/26/1940  DATE : 04/28/2010                                OFFICE VISIT.   PRESENTING COMPLAINT:  Followup for diabetes mellitus as well as hypertension and anticoagulation issues.  SUBJECTIVE:  Heather [name] is a 70 year old Caucasian female who was last seen in November 2011 when she was having lower extremity edema.  She was given Lasix, which she has been using on p.r.n. basis.  She says lately, she has not had any problems.  In February, she was seen by Dr. Renard Matter and treated for herpes zoster.  She is feeling better but she still has some intermittent pain in that region.  It involved the right gluteal region.  She says she saw Dr. Renard Matter and had some surgery on her throat.  She did have Doppler study of her lower extremities, she believes it did not show anything significant.  Heather [name] states that her fasting glucose runs around 100, given and take, and postprandial can run between 150 and 175.  She is not having any symptoms of hypoglycemia.  Her weight has been stable.  She has 1 to 2 bowel movements per day.  She states she still has scant amount of drainage from her mid abdominal wound.  She states she was discharged by Dr. Nickola Major in November.  He suggested if she has any problem, she will come back.  She is using cane to ambulate because of knee arthritis.  She has not experienced any paresthesia or numbness in her lower extremities. She also has history of extensive aortic atherosclerotic changes but she is not interested in going back to see vascular surgeon again.  CURRENT MEDICATIONS:  Vitamin D3 2000 units daily, alendronate 70 mg q. weekly, metoprolol 50 mg p.o. b.i.d., clonidine 0.1 mg p.o. b.i.d., calcium 600 mg daily, Zetia 10 mg  p.o. daily, MVI daily, levothyroxine 112 mcg p.o. daily, enalapril 10 mg p.o. daily, Dulcolax p.r.n., Coumadin 3 mg 5 days each week and 1.5 mg 2 days each week, Lantus 30 units at bedtime, KCl 20 mEq daily on days she takes Lasix, Lasix 40 mg daily p.r.n., Allegra 180 mg daily.  OBJECTIVE:  VITAL SIGNS:  Weight 152 pounds, she is 60 inches tall, pulse 72 per minute, blood pressure 120/74, temperature is 97.8. HEENT:  Conjunctivae are pink.  Sclerae are nonicteric.  Oropharyngeal mucosa is normal. NECK:  No neck masses or thyromegaly noted. LUNGS:  Clear to auscultation. HEART:  Regular rhythm.  Normal S1 and S2.  No murmur, rub, or gallop noted. ABDOMEN:  She has funnel-shaped area in the mid abdomen with tiny opening and mucopurulent discharge, which is scant.  There is no odor. Her abdomen is soft with fullness in left upper quadrant but no definite mass palpated. EXTREMITIES:  No peripheral edema or clubbing noted.  ASSESSMENT: 1. Diabetes mellitus.  She is due for hemoglobin A1c.  It appears     glucose levels are remaining within desirable  range. 2. History of deep venous thrombosis and pulmonary embolism.  She is     long overdue for INR.  Luckily, she does not having any problems. 3. History of colon carcinoma status post left and transverse     colectomy in September 2007 with transverse colostomy.  She is     status post chemo and has remained in remission.  Her last     colonoscopy was in September 2008.  She had takedown of her     ileostomy by Dr. Nickola Major in 2009 and this was complicated by staph     sepsis as well as wound infection and her wound has been slowly     healing but not completely healed yet. 4. History of peripheral vascular disease.  She does not want to go     back to see her vascular surgeon. 5. Hypertension, blood pressure appears to be well controlled. 6. Hyperlipidemia.  PLAN:  She will have CBC, TSH, and Chem-20 today.  We will also check her  hemoglobin A1c and request the report of recent vascular study.  If labs are normal, we will plan to see her back in 6 months, but she should also consider having surveillance colonoscopy this year.     Lionel December, M.D.     NR/MEDQ  D:  04/28/2010  T:  04/29/2010  Job:  [id number]  cc:   Angus G. Renard Matter, MD Fax: [phone]  Electronically Signed by Lionel December M.D. on 05/16/2010 11:33:49 AM

## 2010-06-03 NOTE — H&P (Signed)
[name], [name]               ACCOUNT NO.:  [id number]   MEDICAL RECORD NO.:  [id number]          PATIENT TYPE:  OIB   LOCATION:  1538                         FACILITY:  [facility]   PHYSICIAN:  Sharlet Salina T. Hoxworth, M.D.DATE OF BIRTH:  1940/05/03   DATE OF ADMISSION:  08/11/2007  DATE OF DISCHARGE:                              HISTORY & PHYSICAL   CHIEF COMPLAINT:  Colostomy, ventral hernia.   HISTORY OF PRESENT ILLNESS:  Heather [name] is a very pleasant 70-year-  old female patient of Dr. Dionicia Abler in [place]. She has multiple medical  problems but on the day of October 19, 2005 presented with acute colonic  obstruction secondary to local advanced adenocarcinoma and this was  resected by Dr. Malvin Johns.  The tumor was described in the area of the  splenic flexure just distal this and she underwent resection with end  transverse colostomy and Long Hartmann pouch.  Her postoperative course  was complicated by a pulmonary embolus.  She did however recover  reasonably well following this.  She has been seen Dr. Mariel Sleet and has  undergone adjuvant chemotherapy. Tolerated this well and has no evidence  of disease on follow-up.  She did have no positive disease.  She has had  a flexible sigmoidoscopy and colonoscopy via her transverse colostomy,  which were negative.  Due to her multiple medical problems and difficult  situation, she was referred down to [facility] for colostomy and  takedown and repair of her ventral hernia.  Following a long discussion  in the office regarding essential difficulties with surgery and risks,  she is admitted for this procedure.   PAST MEDICAL HISTORY:  1. Insulin-dependent diabetes mellitus.  2. Hypertension.  3. Peripheral vascular disease with claudication which has been      evaluated by Dr. Gretta Began.  She has seen [facility] and      has had one study showing a questionable remote MI but a perfusion      study that was negative.  She is on  chronic anticoagulation      following her pulmonary embolism 2 years ago.  4. Also followed for hypothyroidism.   PAST SURGICAL HISTORY:  1. Other than above, include remote cholecystectomy through an upper      midline incision.  2. Back surgery.  3. Breast biopsy.  4. Repair of ventral hernias following cholecystectomy.  5. Knee surgery.  6. Tubal ligation.  7. Bladder suspension.   MEDICATIONS:  1. Enalapril 10 mg daily.  2. Multivitamin daily.  3. Metoprolol 50 mg twice daily.  4. Lasix 20 mg every other day.  5. Clonidine 0.1 mg twice a day.  6. Potassium every other day.  7. Zetia 10 mg daily.  8. Levothyroxine 100 mcg daily.  9. Lantus insulin 30 units at night.  10.Coumadin 2.5 mg daily.   ALLERGIES:  SULFA, MOTRIN and QUININE.   SOCIAL HISTORY:  She is married.  She recently quit cigarettes in  preparation for the surgery.  Does not drink alcohol.   FAMILY HISTORY:  History of emphysema, congestive heart failure.   REVIEW OF  SYSTEMS:  GENERAL:  Positive for some fatigue.  CARDIOVASCULAR: Denies chest pain or palpitations.  PULMONARY: Denies  recent shortness of breath, cough, wheezing, although she has some  limited physical activity due to joint disease.  GI: No nausea,  vomiting, history of liver disease or trouble with her colostomy.  GU:  No urinary burning, frequency.  MUSCULOSKELETAL: Shows some chronic back  pain and knee pain for arthritis, difficulty getting around.   PHYSICAL EXAM:  She is 5 feet 9 inches, 159 pounds. Afebrile.  Vital  signs within normal limits.  GENERAL:  Elderly white female in no acute distress.  SKIN:  Warm, dry.  No rash or infection.  HEENT: No masses or thyromegaly.  Sclerae nonicteric.  Oropharynx clear.  LYMPH NODES:  No cervical, subclavicular inguinal nodes palpable.  LUNGS:  Clear without wheezing or increased work of breathing.  CARDIAC:  Regular rate and rhythm.  No murmurs.  No JVD, 1+ ankle edema.  Feet are warm,  but no palpable pulses.  ABDOMEN:  There is a long  midline incision.  There is a large ventral hernia to the right of the  incision extending over and involving her stoma site in the right upper  quadrant.  No palpable masses.  Generally soft and nontender.  No  organomegaly.  EXTREMITIES: 1+ edema.  No effusions or deformity.  NEUROLOGIC:  Alert, oriented.  Motor and sensory exams grossly normal.   ASSESSMENT/PLAN:  70. A 70 year old female status post partial left colectomy and      transverse colostomy for stage III cancer with no evidence of      recurrence.  She desires colostomy takedown.  2. History of pulmonary embolus, on chronic anticoagulation.  3. Questionable history of coronary artery disease.  She has been      evaluated by [facility].  4. Diabetes mellitus.  5. Hypertension.  6. Ventral incisional hernia.  7. History of tobacco abuse.   The patient is admitted for takedown of her colostomy and repair of her  ventral hernia.      [name], M.D.  Electronically Signed     BTH/MEDQ  D:  08/11/2007  T:  08/11/2007  Job:  [id number]   cc:   Ladona Horns. Mariel Sleet, MD  Fax: [phone]   Trilby Drummer, M.D.

## 2010-06-03 NOTE — Discharge Summary (Signed)
Heather [name], Heather [name]               ACCOUNT NO.:  [id number]   MEDICAL RECORD NO.:  [id number]          PATIENT TYPE:  INP   LOCATION:  1538                         FACILITY:  [facility]   PHYSICIAN:  Sharlet Salina T. Hoxworth, M.D.DATE OF BIRTH:  [birth year]   DATE OF ADMISSION:  08/11/2007  DATE OF DISCHARGE:  08/30/2007                               DISCHARGE SUMMARY   DISCHARGE DIAGNOSIS:  Status post colectomy and colostomy for colon  cancer.   SURGICAL PROCEDURES:  Is takedown of colostomy August 11, 2007, Dr.  Johna Sheriff.   HISTORY OF PRESENT ILLNESS:  Zyla Dascenzo is a 70 year old female  followed by Dr. Karilyn Cota in [place].  She has history of multiple  medical problems and in October 2007 presented with an acute colonic  obstruction secondary to a locally advanced adenocarcinoma.  This was  operated on by Dr. Ferman Hamming and she underwent resection of tumor  and splenic flexure with end transverse colostomy and a long Hartmann  pouch.  Her postoperative course was complicated by pulmonary embolus.  She did, however, eventually recover.  Underwent adjuvant chemotherapy  by Dr. Mariel Sleet.  She has had no evidence of cancer on followup.  She  did have node-positive disease.  She had a recent flex sig and  colonoscopy be her transverse colostomy by Dr. Karilyn Cota which were  unremarkable.  Due to multiple medical problems, she was referred to  [facility] for colostomy takedown and repair of a large ventral hernia.  We had a long discussion in the office regarding difficulties and risks  of surgery and she is now admitted for this procedure.   PAST MEDICAL HISTORY:  Significant for:  1. Insulin-dependent diabetes mellitus.  2. Hypertension.  3. Peripheral vascular disease with claudication.  4. Questionable remote MI and hypothyroidism.   Other surgical history includes:  1. Cholecystectomy.  2. Back surgery.  3. Breast biopsy.  4. Repair of ventral hernias following cholecystectomy.  5. Knee surgery.  6. Tubal ligation.  7. Bladder suspension.   MEDICATIONS ON ADMISSION:  1. Enalapril 10 daily.  2. Multivitamin daily.  3. Metoprolol 50 twice daily.  4. Lasix 20 every other day.  5. Clonidine 0.1 twice a day.  6. Potassium every other day.  7. Zetia 10 daily.  8. Levothyroxine 100 mcg daily.  9. Lantus insulin 30 at night.  10.Coumadin 2-1/2 mg daily stopped 5 days prior to admission.   ALLERGIES:  1. SULFA.  2. MOTRIN.  3. QUININE.   SOCIAL HISTORY FAMILY HISTORY REVIEW OF SYSTEMS:  See detailed H and P.   PERTINENT PHYSICAL EXAM:  She is 5 feet 9 inches, 159 pounds, afebrile.  VITAL SIGNS:  Normal.  Pertinent findings were limited to abdomen.  There was a long midline  incision with a large upper abdominal ventral hernia extending to the  right and involving her stoma site of the right upper quadrant.  No  palpable masses, 1+ lower extremity edema.   HOSPITAL COURSE:  The patient was admitted on the morning of her  procedure following a mechanical antibiotic bowel prep at home and  underwent takedown  of her colostomy and ventral hernia repair using a  MTF biologic tissue matrix.  Postoperatively she was stable and  comfortable.  JPs were left in her subcutaneously following the hernia  repair and she had serosanguineous drainage.  She began having flatus on  the 5th postoperative day and clear liquids were started.  Her diet was  gradually advanced.  She had difficulty getting up and moving around and  physical therapy was consulted for mobility.  She was progressing  unremarkably until the 8th postoperative day when she was found to have  some increased left lower quadrant tenderness.  White count elevated to  16,000.  She felt weak but had no serious abdominal pain.  At this point  she was having quite a bit of diarrhea.  C.  Diff was checked and was  negative.  Also noted at this time was elevated BUN and creatinine of 28  and 2.1.  The patient was  treated with some additional fluid bolus.  Urine output improved.  On 08/01 white count was decreased to 13,000 but  she had some emesis and an NG tube was replaced.  CT scan of the abdomen  was obtained at this point which revealed possibly contained anastomotic  leak with a few tiny air bubbles and some inflammatory change around the  anastomosis but no contrast extravasation.  At this point the patient  had been restarted on IV antibiotics.  White count was down to 10,000.  She felt better and abdomen was minimally tender.  I elected to treat  this nonoperatively at this point.  T and A was started due to her  inability take p.o. intake.  She remained on IV Cipro and Flagyl.  Over  the next several days, she definitely improved with decreasing  tenderness, no pain and overall feeling better.  On the 4th her white  count was 12,000, BUN and creatinine were normal.  ABDOMEN:  Was minimally tender.  She began to have less diarrhea and her  nausea resolved and NG tube was discontinued on 08/05.  Followup white  count on 08/06 was 12,000.  JP drains were removed.  Her diet was  gradually advanced and all her oral medications were resumed as well as  insulin.  Physical therapy worked with her and her endurance strength  improved.  On 08/11 her white count was 8000.  Albumin is increased to  1.9.  She was nontender.  Her wound was clean.  Staples out and Steri-  Stripped.  She is discharged home on 08/11.   DISCHARGE MEDICATIONS:  Are the same as admission.  Only medication  additionally is Tylenol for pain.   FOLLOWUP:  Is to be in my office in 2 weeks.      [name], M.D.  Electronically Signed    BTH/MEDQ  D:  10/17/2007  T:  10/17/2007  Job:  [id number]

## 2010-06-03 NOTE — Assessment & Plan Note (Signed)
NAMEMarland Kitchen  Heather, [name]                CHART#:  [id number]   DATE:  06/03/2006                       DOB:  1940-05-18   PRESENTING COMPLAINT:  Follow up regarding diarrhea and PE.   SUBJECTIVE:  Heather [name] is a 70 year old Caucasian female who is here for a  scheduled visit.  She was last seen in March of 2008.  Just prior to  that visit, she was hospitalized for acute colitis.  She had multiple  colonic ulcers felt to be secondary to chemotherapy.  Dose was adjusted  by Dr.  Mariel [name].  She has received chemotherapy since her last visit  and has had no GI side effects.  She has one more cycle to go.  She has  lost six pounds since her last visit.  She feels that it is related to  chemo when she looses her appetite.  She reports no problems with her  colostomy.  She states stool is generally liquid or pasty.  She has not  seen any blood.  Her appetite is normal.  She has gone back to smoking  back up to a pack a day.  She states her fasting glucose runs between 66  and 110.  She has not experienced any symptoms of hypoglycemia. She  denies claudication.   She is on metoprolol 25 mg b.i.d., Synthroid 112 mcg every day, Zetia 10  mg every day.  Lasix 20 mg every day p.r.n. KCL 20 mEq every day p.r.n.  Lantus 20 to 21 units subcu nightly depending on glucose levels,  Coumadin 2 mg every day. __________ elixir p.r.n., enalapril 10 mg every  day, and clonidine 0.1 mg every day.   OBJECTIVE:  VITAL SIGNS:  Weight 143 pounds, she is 5 feet tall.  Pulse  64, blood pressure 140/70, temp is 97.8.  HEENT:  Conjunctivae is pink.  Sclerae is nonicteric.  Oropharyngeal  mucosa is normal.  No neck masses are noted.  LUNGS:  Clear.  ABDOMEN:  Symmetrical with well healed midline scar.  Colostomy is in  the right upper quadrant.  On palpation soft, nontender abdomen.  She does not have peripheral edema.   Her labs from 04/29/2006 done by Dr. Mariel [name].  Her glucose was 194,  creatinine 1.09, her  albumin was 2.7 and alkaline phosphatase was 135.   ASSESSMENT:  1. Diabetes mellitus.  Now her control would be appear to be two      types. I believe weight reduction has helped improve her glycemic      control.  She will have hemoglobin A1c following the next visit.  2. History of pulmonary embolus postop when she had her colostomy with      left hemicolectomy.  She may be able to come off the Coumadin later      this year after she has had her colostomy take down.  3. Hypertension.  Her blood pressure control appears to be better than      before. Would like to see her systolic blood pressure less than      130.  4. Stage III adenocarcinoma of the colon.  She had 1/10 lymph nodes      positive. She has completed 5 cycles of chemotherapy and has 1 more      cycle to go.  After this has been accomplished, she will  follow up      with Dr. Malvin [name] and plan to have her colostomy taken down.  When      she had her colonoscopy back in February this year by Dr. Jena [name],      she had not had her distal segment looked at and this will need to      be examined prior to take down of her colostomy and we may be able      to plan that she can have it preoperatively or intraoperatively.      We will arrange this with Dr. Malvin [name].   PLAN:  The patient will continue present medicines.  We gave her refills  for her enalapril, warfarin, metoprolol, Synthroid and clonidine last  week.   She will have INR by [facility] lab next week.   We will plan to see her back in six months.       Heather [name], M.D.  Electronically Signed     NR/MEDQ  D:  06/03/2006  T:  06/04/2006  Job:  [id number]   cc:   Heather [name]. Heather Sleet, MD  Heather [name], M.D.

## 2010-06-03 NOTE — Op Note (Signed)
NAMEHARTLEY, WYKE               ACCOUNT NO.:  [id number]   MEDICAL RECORD NO.:  [id number]          PATIENT TYPE:  INP   LOCATION:  0098                         FACILITY:  [facility]   PHYSICIAN:  Sharlet Salina T. Hoxworth, M.D.DATE OF BIRTH:  10/15/1940   DATE OF PROCEDURE:  09/18/2007  DATE OF DISCHARGE:                               OPERATIVE REPORT   PREOPERATIVE DIAGNOSIS:  Colonic fistula into abdominal wound.   POSTOPERATIVE DIAGNOSIS:  Colonic fistula into abdominal wound.   SURGICAL PROCEDURES:  Incision, drainage and packing of abdominal wound.   SURGEON:  [name], M.D.   ASSISTANT:  Sandria Bales. Ezzard Standing, M.D.   ANESTHESIA:  General.   BRIEF HISTORY:  Heather [name] is a 70 year old female with diabetes and  other medical problems who is now 5 weeks status post takedown of a  colostomy with anastomosis of the transverse distal left colon.  She had  had a history of obstructing colon cancer previously.  About 10 days 2  weeks postoperatively the patient had what appeared to be a minor  contained leak of the anastomosis by CT scan with a few adjacent air  bubbles that responded fairly promptly to antibiotics and bowel rest.  She subsequently was discharged home doing reasonably well.  She however  was admitted about 2 weeks ago with some malaise and vomiting.  CT scan  again showed a few tiny air bubbles around the anastomosis without any  evidence of contrast leak.  There was a small amount of fluid in her  abdominal wall hernia sac where a ventral hernia been repaired at the  same time using biologic mesh.  This fluid was aspirated was  serosanguineous and cultures were negative.  She again improved on  antibiotics, was discharged home and was seen in the office earlier this  week doing reasonably well.  She now, however presents with a day or two  of increased abdominal pain, malaise.  She is found to have elevated  white count.  There is distention of her abdominal  wound at the area  where previous hernia repair and CT scan has shown a large gas  collection within the previous hernia sac space with the anastomosis  beneath the fascia at this level and a few air bubbles just beneath the  fascia and a tiny amount of contrast in the abdominal wound as well.  I  have recommended return to the operating room for incision and drainage  of her wound with apparent fistulous communication into the old hernia  space.  There is no evidence of free leak contrast extravasation.  The  remainder of her abdomen is soft and nontender.  I have discussed with  the patient and family and the fact that we hopefully can get this  fistula to heal with wound management, but she could require a  laparotomy or diversion.  Risks of bleeding, infection, anesthetic  complications discussed and she is brought to operating room for this  procedure.   DESCRIPTION OF PROCEDURE:  The patient brought to operating room and  placed supine position on the operating table and  general endotracheal  anesthesia was induced.  She was already on broad-spectrum antibiotics.  PAS were placed.  The abdomen sterilely prepped and draped.  The lower  portion of her midline incision was incised and dissection carried down  through subcutaneous tissue.  A large pocket of gas was entered.  There  was a small amount of liquid stool at the base of the wound at the level  of the fascia.  This was suctioned and the wound thoroughly irrigated.  She had had a MTF biologic tissue patch placed over her midline wound is  an onlay and this appeared to be granulating well and well incorporated  into the abdominal wound.  However, at the midline there was a clear  tiny opening with a small amount of feculent drainage apparent with  pressure.  The wound was thoroughly irrigated with saline.  Again the  entire base of the wound was fairly clean granulation tissue and felt  quite sturdy and intact.  The wound was  then packed open with moist  Kerlix gauze with plan to manage this expectantly as controlled fistula.  The sponge, needle, and instrument counts correct.  Dry, sterile  dressing was applied.  The patient taken to recovery in stable  condition.      [name], M.D.  Electronically Signed     BTH/MEDQ  D:  09/19/2007  T:  09/19/2007  Job:  [id number]

## 2010-06-03 NOTE — Consult Note (Signed)
Heather [name], Heather [name]               ACCOUNT NO.:  [id number]   MEDICAL RECORD NO.:  [id number]          PATIENT TYPE:  INP   LOCATION:  1531                         FACILITY:  [facility]   PHYSICIAN:  Ronald L. Earlene Plater, M.D.  DATE OF BIRTH:  10/22/1940   DATE OF CONSULTATION:  10/28/2007  DATE OF DISCHARGE:                                 CONSULTATION   HISTORY OF PRESENT ILLNESS:  Miss Heather [name] is a lovely, 70 year old white  female with a complicated history.  She originally was found to have  locally-advanced adenocarcinoma of the colon and underwent resection by  Dr. Malvin Johns approximately 3 years ago.  She underwent take-down of her  colostomy on August 11, 2007 by Dr. Johna Sheriff.  She was admitted to the  hospital with nausea, vomiting, abdominal pain, and found to have a  colonic fistula of abdominal wall, requiring I&D.  The wound came to  heal, but she was readmitted with more nausea, and it was felt that she  probably had a urinary tract infection.  She underwent, on October 23, 2007 in the Emergency room, a CT scan of the abdomen and pelvis without  contrast, that showed no acute process.  She is subsequently admitted,  is undergoing therapy with TPN.  She was found to have blood cultures  with Staph aureus, which is being treated, and she, in addition, was  found to have elevated liver enzymes and a urine culture most recently  that revealed E. coli.  She is being currently maintained on intravenous  vancomycin and Rocephin, and the E. coli was  sensitive to the Rocephin.  She has been very tired, with nausea, but  she has been afebrile and she is for urologic evaluation.  She notes  that she has really not had problems with urinary tract infections  previously to this episode.   PAST MEDICAL HISTORY:  She has a history of:  1. Colon cancer as noted.  2. Insulin-dependent diabetes mellitus.  3. Hypertension.  4. Peripheral vascular disease.  5. Questionable remote history of an MI  and hypothyroidism.   PAST SURGICAL HISTORY:  1. Resection of the colon cancer, as above.  The take-down on July 22, 2007.  2. I and D of the colonic cutaneous fistula on September 18, 2007.  3. Cholecystectomy in the past.  4. Back surgery.  5. Breast surgery.  6. Ventral hernia repairs.  7. Knee surgery.  8. Tubal ligation.  9. Bladder suspension.   MEDICATIONS:  She was on at admission, multivitamins, Enalapril,  Metoprolol, Clonidine, Zetia, Coumadin, Levothyroxine, Lasix, and Klor-  Con.   ALLERGIES:  1. Sulfa.  2. Motrin.  3. Quinine.   SOCIAL HISTORY:  Recently quit smoking.  Negative alcohol.   FAMILY HISTORY:  Significant for __________  and congestive heart  failure.   REVIEW OF SYSTEMS:  She has lethargy, nausea, and abdominal pain.  All  other review of systems were essentially negative.   PHYSICAL EXAMINATION:  VITAL SIGNS:  She is afebrile, vital signs  stable, though she had fever of 102 on  admission.  Blood pressure is  130/70.  Temperature 97.7.  Pulse 81.  Respirations 20.  GENERAL:  She is in no acute distress.  She is oriented x3.  HEAD, EYES, NOSE, AND THROAT:  Normal.  NECK:  Without masses or thyromegaly.  CHEST:  Clear anteriorly and posteriorly, without rales or rhonchi.  HEART:  Normal sinus rhythm.  No murmurs or gallops.  ABDOMEN:  Soft, nondistended.  Really nontender.  The wound appears to  be healing in the midline by secondary intention.  EXTREMITIES:  Normal.  NEUROLOGIC:  Intact.  She has no costovertebral angle, suprapubic  tenderness, or masses.  SKIN:  Appears to be normal.   PERTINENT LABORATORY EVALUATION:  BUN and creatinine is 23/0.79.   REASON FOR ADMISSION:  CT scan is noted.  CT of abdomen and pelvis  without contrast on October 23, 2007 was negative for urologic disease.   IMPRESSION:  Miss Heather [name] has multiple problems, liver enzymes  abnormality, nausea, abdominal pain from take-down of fistula, and  colonic cutaneous  fistula with healing.  She is diabetic and has Staph  sepsis, but has what appears to be an E. coli urinary tract infection.  She is being treated with Rocephin intravenous, and this will certainly  take care of this.  I would recommend at least 7 days.  She can then be  switched to Macrobid 100 mg p.o. b.i.d at discharge.  It is also  sensitive to Septra, but she is allergic to Septra.  We will follow up  as an outpatient and determine if she needs long-term prophylaxis.  I do  not feel that a cystoscopic evaluation is currently indicated, but might  consider it in the future, if it persists.  There is currently no  evidence of a urinary fistula, and I feel the Foley catheter can be  removed when medically appropriate.  Please call as needed for further  recommendations.      Ronald L. Earlene Plater, M.D.  Electronically Signed     RLD/MEDQ  D:  10/28/2007  T:  10/28/2007  Job:  [id number]   cc:   [name], M.D.  [address], [address]  [place]  [place] [address]   [employer]

## 2010-06-03 NOTE — Procedures (Signed)
NAMETESIA, LYBRAND               ACCOUNT NO.:  [id number]   MEDICAL RECORD NO.:  [id number]          PATIENT TYPE:  REC   LOCATION:  RAD                           FACILITY:  [facility]   PHYSICIAN:  Pricilla Riffle, MD, FACCDATE OF BIRTH:  [birth year]   DATE OF PROCEDURE:  11/01/2006  DATE OF DISCHARGE:                                ECHOCARDIOGRAM   TEST INDICATIONS:  A 70 year old with history of hypertension, history  of MI, test to evaluate LV function.   A 2-D echo with echo Doppler.  The left ventricle is normal in size with  an end-diastolic dimension of 39 mm, interventricular septum and  posterior wall are mildly thickened at 14 and 12 mm each.  Left atrium  is normal at 29 mm.  Right atrium, right ventricle are normal.  Aortic  root is normal at 30 mm.   The aortic valve is normal with no insufficiency.  Mitral valve is  mildly thickened.  No insufficiency.  Pulmonic valve is normal with no  insufficiency.  Tricuspid valve is normal with mild insufficiency (1/4).   Overall LV systolic function is normal with an LVEF of 65-70%.  Note  there is mild diastolic dysfunction.   RVF is normal.   No pericardial effusion is seen.      Pricilla Riffle, MD, [facility]  Electronically Signed     PVR/MEDQ  D:  11/01/2006  T:  11/02/2006  Job:  [id number]

## 2010-06-03 NOTE — H&P (Signed)
NAMEJARELIS, Heather [name]               ACCOUNT NO.:  [id number]   MEDICAL RECORD NO.:  [id number]          PATIENT TYPE:  INP   LOCATION:  6729                         FACILITY:  [facility]   PHYSICIAN:  Ollen Gross. Vernell Morgans, M.D. DATE OF BIRTH:  1940/11/08   DATE OF ADMISSION:  09/07/2007  DATE OF DISCHARGE:                              HISTORY & PHYSICAL   HISTORY OF PRESENT ILLNESS:  Heather [name] is a 70 year old white female  who presents to the emergency department with nausea and vomiting, and  apparently failing to thrive after a colostomy reversal about a month  ago by Dr. Johna Sheriff.  She feels as though she has just not been able to  eat since the surgery.  She has had some nausea and vomiting associated  with it.  She does pass gas and move her bowels.  She has not really  noticed any abdominal distention.  She does have some mild abdominal  tenderness.   PAST MEDICAL HISTORY:  Significant for diabetes, hypertension,  peripheral vascular disease, pulmonary embolism, hypothyroidism, and  chronic anticoagulation.   PAST SURGICAL HISTORY:  Significant for cholecystectomy, back surgery,  breast biopsy, repair of ventral hernias after cholecystectomy, knee  surgery, tubal ligation, bladder suspension, partial colectomy, and  colostomy reversal with further repair of ventral hernias.   MEDICATIONS:  Enalapril, multivitamin, Toprol, Lasix, clonidine,  potassium, Zetia, levothyroxine, insulin, and Coumadin.   ALLERGIES:  SULFA, MOTRIN, and QUININE.   SOCIAL HISTORY:  She quit smoking prior to her last surgery and denies  any alcohol use.   FAMILY HISTORY:  It is significant for emphysema and congestive heart  failure in the parents.   PHYSICAL EXAMINATION:  VITAL SIGNS:  Temperature is 97.9, blood pressure  167/86, and pulse of 97.  GENERAL:  She is an elderly white female in no acute distress.  SKIN:  Warm and dry.  No jaundice.  HEENT:  Eyes are anicteric.  Extraocular muscles are  intact.  Pupils are  equal, round, and reactive to light.  Sclerae nonicteric.  LUNGS:  Clear bilaterally with no use of accessory respiratory muscles.  HEART:  Regular rate and rhythm with impulse in the left chest.  ABDOMEN:  Soft with some mild sort of diffuse tenderness, but no  guarding or peritonitis.  Her incisions have healed up well.  No signs  of infection externally.  EXTREMITIES:  No cyanosis, clubbing, or edema.  Good strength in arms  and legs.  PSYCHOLOGIC:  She is alert and oriented x3 with no evidence of anxiety  or depression.   LABORATORY DATA:  On review of her lab work, she does have what appears  to be urinary tract infection.  White counts around 12,000.  She had a  CT scan done.  CT was reviewed with the radiologist and was significant  for what looks like possible abdominal wall abscess along her midline  incision.  No real intra-abdominal abscess, although she does have a a  dot or two of extraluminal air.  This is significantly decreased from  the amount of extraluminal air that was present on  the skin about 3  weeks ago.   ASSESSMENT AND PLAN:  This is a 70 year old white female with failure to  thrive, possible abdominal abscess, and a possible urinary tract  infection.  We will plan to admit her for IV hydration.  We will start  her on some antibiotics.  We will discuss this with Dr. Johna Sheriff.  She  may require percutaneous drainage of this abdominal wall fluid.      Ollen Gross. Vernell Morgans, M.D.  Electronically Signed     PST/MEDQ  D:  09/07/2007  T:  09/08/2007  Job:  [id number]

## 2010-06-03 NOTE — Op Note (Signed)
Heather [name], Heather [name]               ACCOUNT NO.:  [id number]   MEDICAL RECORD NO.:  [id number]          PATIENT TYPE:  OIB   LOCATION:  1538                         FACILITY:  [facility]   PHYSICIAN:  Heather [name], M.D.DATE OF BIRTH:  August 17, 1940   DATE OF PROCEDURE:  08/11/2007  DATE OF DISCHARGE:                               OPERATIVE REPORT   PREOPERATIVE DIAGNOSES:  1. Status post colectomy and colostomy.  2. Large ventral hernia.   POSTOPERATIVE DIAGNOSES:  1. Status post colectomy and colostomy.  2. Large ventral hernia.   SURGICAL PROCEDURES:  1. Takedown of colostomy with partial colon resection and anastomosis.  2. Ventral hernia repair with FlexHD biologic tissue matrix.   SURGEON:  Heather [name]. [name], M.D.   ASSISTANT:  Heather [name], M.D.   ANESTHESIA:  General.   BRIEF HISTORY:  [name] is a 70 year old female with multiple  medical problems who is status post emergency colectomy by Dr. Malvin [name]  in I believe 2007 for an obstructing carcinoma of the colon.  This was  at the splenic flexure, and she underwent a resection and transverse  colostomy and long Hartmann pouch on the left.  She subsequently has had  adjuvant chemotherapy with Dr. Mariel [name] and has no evidence of disease.  She strongly desires a colostomy takedown.  She is a relatively high  risk patient due to multiple medical problems.  She understands this.  She also has a large ventral hernia which will require repair at the  same time.  The nature of the procedure, indications, anesthetic risks,  risks of bleeding, infection, anastomotic leak, cardiorespiratory  complications up to including death were discussed and understood.  Following mechanical antibiotic bowel prep at home, she is taken to the  operating room for this procedure.   DESCRIPTION OF OPERATION:  The patient was brought to the operating room  and placed in the supine position on the operating table, and general  orotracheal anesthesia was induced.  She received broad-spectrum  preoperative antibiotics.  The colostomy site in the right upper  quadrant was closed with a pursestring silk suture.  It was widely  sterilely prepped and draped.  Foley catheter had been placed.  Correct  patient and procedure were verified.  The previous midline incision was  used and dissection carried down through the subcutaneous tissue using  cautery.  Dissection was carried down to the level of the fascia.  This  was incised with cautery.  There were some hernia defects encountered  along the superior part of the incision, and then the fascia really  along the whole majority of the right upper quadrant extending over to  the colostomy site was essentially absent.  The peritoneal cavity was  entered.  There were extensive intra-abdominal adhesions that were  lysed.  The anterior abdominal wall was cleared in all directions.  The  colostomy site cleared.  The colostomy was isolated up to the abdominal  wall and divided at this point with the GIA stapler.  The left colon was  identified, dissecting small bowel adhesions up off of this.  There was  a good long segment of colon remaining with essentially normal-appearing  sigmoid up to about the mid left colon.  This was mobilized on its  mesentery as much as possible.  The proximal colon was mobilized, taking  down the hepatic flexure and mobilizing the lesser omentum so that the  mid transverse colon could be brought down to the mid to distal  descending colon under no tension.  Following full mobilization, points  for anastomosis of healthy-appearing bowel proximally and distally were  chosen, and these areas were cleaned of mesenteric and pericolic fat and  divided between Heather [name] and Heather [name] clamps and small portions of the colon  either side resected, dividing mesentery between clamps and tying with 2-  0 silk ties.  Following this, an end-to-end anastomosis was created  with  full-thickness interrupted inverting 2-0 silks.  There was no tension  and was excellent blood supply.  There was some discrepancy between the  proximal distal bowel due to disuse, but this was not a major issue.  The anastomosis appeared widely patent and was airtight at completion.  Following this, the abdomen was thoroughly irrigated and hemostasis  assured.  Attention was then turned to the abdominal wall closure.  Again, there was a large hernia sac occupying the majority of the right  upper quadrant extending over to the colostomy site.  Initially, the  remaining colostomy at the skin level was elliptically excised, and the  small area of remaining colon left after to be stapled to the abdominal  wall was completely excised.  This fascial defect was closed with  interrupted #1 Novofil's.  I elected to perform an onlay repair with  FlexHD biologic mesh in order to obtain fascial closure.  Skin and  subcutaneous flaps were raised on both sides back about at least 5 or 6  cm, and in addition, the colostomy site was also exposed anteriorly  raising skin and subcutaneous flaps.  This allowed closure of the fascia  in the midline with interrupted #1 Novofil sutures.  Following this, a  16 x 8 cm piece of FlexHD biologic mesh was chosen and was used as an  onlay.  We were able to stretch this laterally to cover probably 10-12  cm from side-to-side which gave [place] good coverage over the midline  closure and the ostomy site as well.  This was sutured to the anterior  fascia with interrupted Novofil sutures circumferentially with nice  broad coverage.  Following this, the abdominal wound was thoroughly  irrigated with saline.  Two closed suction drains were brought out  through separate stab wounds.  The subcutaneous tissue was closed with  running 2-0 Vicryl, and the skin was closed with staples.  Sponge,  needle and instrument counts were correct.  The patient was taken to  recovery in  good condition.      Heather [name]. [name], M.D.  Electronically Signed     BTH/MEDQ  D:  08/11/2007  T:  08/11/2007  Job:  [id number]

## 2010-06-03 NOTE — H&P (Signed)
[name], Heather [name]               ACCOUNT NO.:  [id number]   MEDICAL RECORD NO.:  [id number]          PATIENT TYPE:  INP   LOCATION:  1531                         FACILITY:  [facility]   PHYSICIAN:  Jennelle Human. Marisue Humble, MD DATE OF BIRTH:  02-11-1940   DATE OF ADMISSION:  10/23/2007  DATE OF DISCHARGE:                              HISTORY & PHYSICAL   CHIEF COMPLAINT:  Nausea, vomiting, and abdominal pain.   HISTORY OF PRESENT ILLNESS:  This is a 70 year old lady who is a patient  of Dr. Dionicia Abler in [place].  She has multiple medical problems to  include a history of acute colonic obstruction secondary to locally  advanced adenocarcinoma that was first resected by Dr. Malvin Johns.  Recently, she has had a take-down of her colostomy on July 23.  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  She still  continues to have a wound that is healing by secondary intention.   She had just come home this last Monday and has been doing well.  She  has had a little bit of nausea but other than that, has been herself.  She started to have nausea and vomiting with abdominal pain this morning  that was associated with fever.  This was similar as to what had  happened with her before, so she was brought to the emergency  department.  She states that she has had some burning during urination  and was somewhat disoriented earlier.  She denies any significant  drainage from the wound.  The ER physicians did a CT of the abdomen,  which showed no acute process.  No obstruction was found nor was there  evidence of an intra-abdominal abscess.  She was found to have a  significant UTI which went along with her history of pain with  urination.  It was felt that she had a urinary tract infection as a  cause of her nausea and vomiting, and she was treated  with Rocephin in  the emergency room.  We had been called to admit her for treatment.   PAST MEDICAL HISTORY:  1. History of adenocarcinoma of the colon, first resected by Dr.      Malvin Johns.  2. Insulin-dependent diabetes mellitus.  3. Hypertension.  4. Peripheral vascular disease.  5. Questionable remote history of MI.  6. Hypothyroidism.   PAST SURGICAL HISTORY:  1. Resection of locally advanced adenocarcinoma with colostomy with      subsequent take-down that occurred on August 11, 2007.  2. 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  3. Cholecystectomy requiring an upper midline incision.  4. Back surgery.  5. Breast biopsy.  6. Repair of ventral hernias following cholecystectomy.  7. Knee surgery.  8. Tubal ligation.  9. Bladder suspension.   MEDICATIONS:  1. Women's multivitamin.  2. Enalapril 10 mg daily.  3. Metoprolol 50 mg b.i.d.  4. Clonidine 0.1 mg b.i.d.  5.  Zetia 10 mg daily.  6. Warfarin 2.5 mg nightly.  7. Levothyroxine 100 mcg daily.  8. Furosemide 20 mg p.o. p.r.n.  9. Klor-Con 10 one tablet p.r.n.   ALLERGIES:  1. SULFA.  2. MOTRIN.  3. QUINIDINE.   SOCIAL HISTORY:  She is married.  Recently quit smoking cigarettes.  Does not use alcohol.   FAMILY HISTORY:  Significant for emphysema and congestive heart failure.   REVIEW OF SYSTEMS:  Otherwise negative except for that stated in the  HPI.   PHYSICAL EXAMINATION:  She was febrile when she first arrived at 102.7.  Respiratory rate was 19.  Pulse was 98.  Blood pressure 108/54.  HEENT:  Dry mucous membranes.  Pupils are equal, round and reactive to  light and accommodation.  NECK:  Supple without lymphadenopathy.  JVP is somewhat sunken.  There  are no carotid bruits, and carotid upstrokes are brisk.  CHEST:  Clear to auscultation bilaterally.  HEART:  Regular rate and rhythm without murmurs, rubs or gallops.  ABDOMEN:  Soft, nondistended.  Mildly tender to palpation.  There is a   wound healing on the mid abdomen, healing by secondary intention that  shows granulation tissue but no evidence of infection.  EXTREMITIES:  No clubbing, cyanosis or edema.  Dorsalis pedis and  posterior tibial pulses are normal.  NEUROLOGIC:  Grossly normal.  DERMATOLOGIC:  No rashes or skin breakdown other than the wound healing  by secondary intention mentioned above that is present.   CT scan showed no evidence of intra-abdominal abscess nor did it show  any evidence of obstruction.  The colocutaneous fistula was not  demonstrated, although the tract of the previously demonstrated fistula  was still visible.   Acute abdominal series was otherwise unremarkable with the chest x-ray  showing no evidence of pulmonary infiltrates.   LABS:  Urinalysis showed too-numerous-to-count white blood cells, many  bacteria, large leukocyte esterase; however, the nitrite was negative.  Sodium was 131, potassium 5, chloride 99, bicarb 22, glucose 192.  BUN  and creatinine was 36 and 1.2, respectively.  White count is 14.2 with a  left shift.  Hemoglobin is 11.3, hematocrit 34.  Platelet count is 380.   ASSESSMENT/PLAN:  1. Urinary tract infection:  This is likely the cause of her recent      nausea and vomiting and abdominal pain.  She had a prodrome of      burning when she urinated, followed by fever, nausea and vomiting.      We will treat her urinary tract infection with Zosyn.  This will      cover both the typical bugs, and it will cover the off-chance that      there is an intra-abdominal process that is not seen on CT.  2. Abdominal wound:  This does not appear to be infected and has      granulation tissue but no evidence of infection.  We will continue      to dress this several times a day.  As mentioned above, there is no      evidence of the abdominal wound infection or any evidence of a new      abdominal process by exam or by CT.  3. Diabetes mellitus:  We will continue her on home  medications, which      currently does not include any oral hypoglycemic agents at this      time, likely secondary to her poor dietary intake.  4. Hypertension:  Currently controlled.  We will continue to treat her      with her ACE inhibitor and beta blocker.  5. History of tobacco use:  We applauded her smoking cessation and      encouraged her to continue.      Jennelle Human Marisue Humble, MD  Electronically Signed     GBS/MEDQ  D:  10/24/2007  T:  10/24/2007  Job:  [id number]

## 2010-06-03 NOTE — H&P (Signed)
NAMEJACULIN, RASMUS               ACCOUNT NO.:  [id number]   MEDICAL RECORD NO.:  [id number]          PATIENT TYPE:  INP   LOCATION:  1539                         FACILITY:  [facility]   PHYSICIAN:  Anselm Pancoast. Weatherly, M.D.DATE OF BIRTH:  [birth year]   DATE OF ADMISSION:  09/18/2007  DATE OF DISCHARGE:                              HISTORY & PHYSICAL   CHIEF COMPLAINT:  Increased abdominal pain.   HISTORY:  Chloee Tena is a 70 year old diabetic who had a colostomy  and the colostomy was taken down by Dr. Johna Sheriff on July 17.  Dr.  Dwain Sarna assisted Dr. Johna Sheriff and the Flex XT biological mesh was used  to repair the fascial defect.  The patient was ultimately discharged.  She is on chronic Coumadin and is a diabetic, and then required  readmission this time to [facility] by Dr. Michaell Cowing on September 07, 2007,  where he aspirated a seroma.  Fluid was obtained and this is anterior to  the mesh but no organisms were every grown, and the patient was  discharged on August 21, approximately 1 week ago on Augmentin orally  and instructed to follow up with Dr. Johna Sheriff in the office on Thursday.  I do not think the patient was actually seen in the office on Thursday,  but she stated that she started having more kind of abdominal tenderness  about that time, and was taken to [facility] Emergency Room last evening  by her family, and the ER physician contacted Dr. Dwain Sarna, who was on-  call at [facility], and he suggested that they obtained a CT and laboratory  studies there, and then to transfer the patient to [facility] where Dr.  Johna Sheriff is on call today and where her original surgery was done.  The  patient was transferred by ambulance and arrived here approximately 4  a.m.   CHRONIC MEDICATIONS:  1. She is on potassium replacement.  2. Enalapril 10 mg daily.  3. Metoprolol 50 mg p.o. b.i.d.  4. Clonidine 0.1 mg b.i.d.  5. Zetia 10 mg for diabetes.  6. She is on Coumadin 2.5 mg a day.  7.  Synthroid 100 mcg day.  8. Lasix 20 mg a day.   The patient states that she has been having bowel function.  She has had  kind of an abdominal tenderness more on the right than the left and she  has just felt poorly.   PHYSICAL EXAMINATION:  VITAL SIGNS:  At this time about 7:00, her  temperature was 100, pulse was 104, respirations 18, blood pressure is  132/72.  LUNGS:  Clear.  ABDOMEN:  She does have kind of a tender abdomen, but she has active  bowel sounds with a large, kind of like a bubble under the skin to the  right of midline where the mesh is used to reinforce the fascial defect.  She has had bowel movements.  I did not actually do a rectal exam on  her.   On the CT that was done earlier this evening.  There is a large gas-  filled collection located anterior to the  fascia, but there is  essentially no fluid in this area and there is no contrast that  extravasates into this.  I do not see an actual intra-abdominal abscess,  but there might be a bubble or 2 kind of around the mesh more to the  right and, of course, her colostomy was taken down somewhere under the  mesh.  I have placed the patient on Zosyn 3.75 mg IV, will give her 5 mg  of vitamin K now.  Dr. Johna Sheriff is reviewing the CTs and at the minimal,  she is going to need to have the skin open, but that could be done at  bedside, but I fear that there is going to be a leak, I would expect  causing this amount of air, even though it is possible that this is not  the issue.  There is a little bit of inflammation in the lower aspect of  the incision, but there is certainly not that of a massive cellulitis of  the subcutaneous skin, and the patient's abdomen, she has got bowel  sounds and besides the tenderness right around the anterior abdominal  wall is not like she has got a generalized peritonitis.  We will give  the vitamin K at this time and then let Dr. Johna Sheriff decide on how he is  going to approach this later  today.  The patient is n.p.o. and her sugar  was 116 this morning.           ______________________________  Anselm Pancoast. Zachery Dakins, M.D.     WJW/MEDQ  D:  09/18/2007  T:  09/18/2007  Job:  [id number]

## 2010-06-03 NOTE — Assessment & Plan Note (Signed)
[facility]                       [place] CARDIOLOGY OFFICE NOTE   NAME:[name], [name]                      MRN:          [id number]  DATE:10/28/2006                            DOB:          February 27, 1940    Heather [name] is a delightful 70 year old patient referred by Dr. Malvin Johns for  pre-op clearance.   The patient has coronary risk factors which include:  1. Diabetes.  2. Hypertension.  3. Hyperlipidemia.  4. There is a question of a previous silent myocardial infarction.   In talking to the patient she has never had chest pain.  Her activity is  primarily limited by right knee pain and fatigue.  She did have an  episode over 10 years ago of chest pain but she says that the paramedics  said it was a muscular pain.  The patient had a 2D echocardiogram done  on October 26, 2005, which was showed normal LV function.  However, there  apparently was a stress test done in the past which questioned a silent  myocardial infarction.  The patient was last seen by Dr. Dorethea Clan.  He  helped adjust some of her risk factors.  The patient needs surgery to re-  hook a colostomy that she had for stage III colon cancer.  She initially  had her surgery last year in October.  Her post-op course was  complicated by pulmonary emboli.  In talking to the patient she does not  recall having shortness of breath or pleuritic pain post-op.  She  appears to have had some pain in the right flank and right side area.  She has been on Coumadin since that time.   In regards to operative risks, the patient is a long term smoker, a pack  a day.  Apparently, Dr. Malvin Johns appropriately told her, he would not  operate unless she quit smoking and she has not smoked in the last 2  weeks.  She has not had any significant chest pain, PND, or orthopnea.  Her sugar has been under good control with a hemoglobin A1c in the 6  range.  Again, her activity is limited by right knee pain.   REVIEW OF  SYSTEMS:  There have been no palpitations or syncope, no  arrhythmia, no significant valvular heart disease, no bleeding  diatheses.  She tells me that she had a course of adjuvant chemotherapy  from January to May and did well and that her cancer is under control.  Her review of systems otherwise negative.   PAST MEDICAL HISTORY:  1. Stage III colon cancer.  2. Hypertension.  3. Hyperlipidemia.  4. Hypothyroidism.  5. Previous right arthroscopic knee surgery.  6. History of pulmonary emboli, post-op October 2007, on chronic      Coumadin.  7. Insulin-dependent diabetes.   PRIMARY CARE PHYSICIAN:  Dr. Karilyn Cota.   She denies any allergies.   MEDICATIONS:  1. Klor-Con at 99 mcg over-the-counter.  2. Enalapril 10 mg daily.  3. Synthroid 112 mcg daily.  4. Zetia 10 a day.  5. Metoprolol 50 b.i.d.  6. Warfarin 2 mg a day.  7. Lantus 20  units a day.  8. Clonidine 0.1 a day.   She checks her sugars at home 1-2 times a day and has indicated her  hemoglobin A1c when last checked was 6.  Her Coumadin level is checked  by Dr. Karilyn Cota, we do not follow it here.  She has not had any problems  with it.   FAMILY HISTORY:  Noncontributory.   The patient is married.  Her husband lives with her at home as well as  one of her sons.  She stopped smoking 2 weeks ago.  She does not drink.  She is retired from [employer] and [employer].  Overall, she  seems to be doing well and does all of her activities of daily living.   PHYSICAL EXAMINATION:  GENERAL:  Remarkable for a middle-aged white  female who looks older than her stated age.  Affect is appropriate.  VITAL SIGNS:  Weight is 140, blood pressure 130/84, pulse 67 and  regular, respiratory rate 14.  She is afebrile.  HEENT:  Normal.  NECK:  Carotids normal without bruit.  There is no lymphadenopathy, no  thyromegaly, no JVP elevation.  LUNGS:  Clear with good diaphragmatic motion.  No wheezing.  HEART:  There is an S1 S2 with  normal heart sounds.  PMI is normal.  ABDOMEN:  Benign.  There is a colostomy.  No tenderness.  No  hepatosplenomegaly, hepatojugular reflux, no AA, no bruit.  Femorals are  plus 3 bilaterally.  EXTREMITIES:  PTs are plus 3.  There is no lower extremity edema.  NEUROLOGIC:  Nonfocal.  SKIN:  Warm and dry.  There is no muscular weakness.   Her EKG shows a sinus rhythm, nonspecific ST-T wave abnormalities.  There is no evidence of a previous MI.   IMPRESSION:  1. Multiple coronary risk factors including insulin-dependent      diabetes, question previous silent inferior wall myocardial      infarction.  The patient needs an adenosine Myoview study and an      echocardiogram prior to clearing her for surgery.  We will try to      expedite this.  2. Hypertension, currently well controlled.  Continue current dosages      of medicines including enalapril and clonidine and a low salt diet.  3. Hypercholesterolemia.  Try to get lab records from Dr. Karilyn Cota.      Continue Zetia.  The patient had been on Lovastatin in the past but      I suspect she had some myalgias with it and it was stopped.  4. Smoking.  The patient will try and continue to abstain prior to her      surgery.  She understands the importance of this insofar as doing      well.  5. History of pulmonary emboli postoperative of her initial colon      surgery.  I do not know if she may have been a little bit      hypercoagulable but this will have to be watched closely.  We will      do some baseline venous duplex to see what the current state of her      veins are.  Prior to repeat surgery she will need Lovenox and      compression hose with early ambulation postoperatively.  6. Anticoagulation with Warfarin.  Since the patient had a      perioperative complication, I would just assume keep her on  Coumadin till probably 2 months after her repeat colon surgery.      The Coumadin will need to be stopped about 5 days prior to.   I do      not think that she needs Lovenox overlap and I suspect she can have      her surgery electively after her INR has drifted down below 2.   Further recommendations will be based on the results of her venous  duplex, stress test, and echo but I suspect that her Myoview will not  show evidence of a previous MI and she will have normal LV function.     Noralyn Pick. Eden Emms, MD, [facility]  Electronically Signed    PCN/MedQ  DD: 10/28/2006  DT: 10/28/2006  Job #: [id number]

## 2010-06-03 NOTE — Discharge Summary (Signed)
NAMEERAN, WINDISH               ACCOUNT NO.:  [id number]   MEDICAL RECORD NO.:  [id number]          PATIENT TYPE:  INP   LOCATION:  6729                         FACILITY:  [facility]   PHYSICIAN:  Ardeth Sportsman, MD     DATE OF BIRTH:  Apr 24, 1940   DATE OF ADMISSION:  09/07/2007  DATE OF DISCHARGE:  09/10/2007                               DISCHARGE SUMMARY   DISCHARGING PHYSICIAN:  Ardeth Sportsman, MD   PROCEDURES:  Aspiration of seroma by Dr. Michaell Cowing On September 07, 2007.   CONSULTANTS:  There were none.   REASON FOR ADMISSION:  Ms. Forker is a 70 year old white female who  presented to the emergency department on September 07, 2007, with  complaints of nausea and vomiting.  She had a colostomy reversal  approximately a month ago by Dr. Johna Sheriff and since then has apparently  been failing to thrive.  She states that she just has not felt right  since the surgery and not been able to eat well either.  She states that  she had some nausea and vomiting.  However, she was passing gas and  having bowel movements.  She also states that she was having some mild  abdominal tenderness.  Because of this, the patient was brought to the  emergency department and evaluated and later admitted.  Please see  admitting history and physical for further details.   ADMITTING DIAGNOSES:  1. Failure to thrive.  2. Possible abdominal abscess.  3. Possible urinary tract infection.  4. Status post colostomy reversal.   DISCHARGE DIAGNOSES:  1. Failure to thrive, which seems to be improving.  2. Abdominal wall seroma.  3. Status post colostomy reversal.   HOSPITAL COURSE:  Once the patient was admitted, she was placed on IV  Zosyn due to a slight increase in her leukocytes of 12.4.  The patient  also received a CT scan, which showed an abdominal wall fluid collection  that possibly represented an abscess.  Therefore, on hospital day #1,  after review of the CT scan, Dr. Michaell Cowing felt that this fluid  collection  was within the abdominal wall and not in the abdominal cavity and  therefore, he went and aspirated this area.  Upon aspiration, he got  syrupy panfluid that was consistent with a seroma/hematoma.  A culture  of this was done, which showed moderate WBCs, however, there was no  organisms or growth seen as preliminary culture.   On hospital day #2, after this procedure, the patient states that she  was feeling much better today and her abdominal pain was not as bad as  that has been.  At this time, her abdomen was soft, less tender with  active bowel sounds and her incision was continuing to heal  appropriately.  At this time, we asked physical therapy come in and  assess the patient and treat as needed due to her failure to thrive.  We  also increased her diet to full liquids.   On hospital day #3, the patient's white count had decreased to 11,800  and the patient was saying that  her abdominal pain continued to get  better.  On exam, she continued to be soft; however, she was just a  little bit sore to palpation, but that has been consistent throughout  the exam.  Therefore, at this time, we encouraged physical therapy to  continue working with her.  At this time, we increased her diet to  carbon-modified regular diet due to her diabetes.  Otherwise, at this  time, our plan is to advance her diet to see if she tolerates that and  if she does and continued to improve with continued decrease in  abdominal pain, we are hoping that she will be able to go home tomorrow  with 7 days worth of continued Augmentin and to follow up with Dr.  Johna Sheriff for her already scheduled appointment, I think next Thursday.   DISCHARGE MEDICATIONS:  She is to continue all home medications, which  include:  1. Potassium 99 mg twice a day.  2. Enalapril 10 mg once a day.  3. Metoprolol 50 mg twice a day.  4. Clonidine 0.1 mg tablets twice a day.  5. Zetia 10 mg once a day.  6. Coumadin 2.5 mg  once a day.  7. Levothyroxine 100 mcg once a day.  8. Furosemide 20 mg once a day.  9. Women's tablet once a day.  10.She is also given a prescription for Augmentin 875 mg one p.o.      b.i.d. for 7 days.   DISCHARGE INSTRUCTIONS:  The patient is informed that she may return to  her regular diabetic diet.  She is also told to increase her activity  and she may walk up steps.  Otherwise, she is also confirmed that she  may shower; however, she is to continue with all prior discharge  instructions per Dr. Johna Sheriff from her prior surgery.  This includes  things like continuing to not lift anything greater than approximately  10-15 pounds for the next several weeks.  She is also informed to call  our office with any new or worsening abdominal pain or fever greater  than 101.5.  Otherwise, she is to follow up with Dr. Johna Sheriff after  discharge for her already scheduled appointment.  However, she is to  call if she has any questions or concerns prior to that.       Letha Cape, PA      Ardeth Sportsman, MD  Electronically Signed    KEO/MEDQ  D:  09/09/2007  T:  09/09/2007  Job:  [id number]   cc:   [name], M.D.

## 2010-06-03 NOTE — Op Note (Signed)
[name], Heather               ACCOUNT NO.:  [id number]   MEDICAL RECORD NO.:  [id number]          PATIENT TYPE:  INP   LOCATION:  1531                         FACILITY:  [facility]   PHYSICIAN:  Juanetta Gosling, MDDATE OF BIRTH:  Nov 02, 1940   DATE OF PROCEDURE:  11/02/2007  DATE OF DISCHARGE:                               OPERATIVE REPORT   PREOPERATIVE DIAGNOSIS:  Staphylococcus aureus sepsis with positive  blood cultures.   POSTOPERATIVE DIAGNOSIS:  Staphylococcus aureus sepsis with positive  blood cultures.   PROCEDURE PERFORMED:  Right subclavian Port-A-Cath removal.   SURGEON:  Juanetta Gosling, MD   ASSISTANT:  None.   ANESTHESIA:  Local with MAC.   SPECIMENS:  Port.   ESTIMATED BLOOD LOSS:  Minimal.   COMPLICATIONS:  None.   Patient to PACU in stable condition.   INDICATIONS:  Heather [name] is a 70 year old female who underwent a  takedown of her colostomy on July 23 by Dr. 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  She is on TNA via a PICC line at  home.  She returned with a bloodstream infection.  Her PICC line was  removed.  She still has a port in place and her blood cultures have  remained persistently positive today.  I placed a PICC line in her  yesterday and have now counseled her for port removal.   PROCEDURE:  After informed consent was obtained, the patient was taken  to the operating room.  She was placed under monitored anesthesia care.  Her right arm was tucked and then the right chest was then prepped and  draped in standard sterile surgical fashion.  This area was infiltrated  with 0.25% Marcaine mixed with 1% lidocaine with epinephrine.  There was  an incision then made overlying the port.  The port was dissected free  from the surrounding surface structures easily.  The port was then  removed, the line was identified, and the whole line and the  port were  removed easily in Trendelenburg position.  Pressure was held for 2  minutes.  There was no bleeding following this.  The hemostasis was observed at the port site.  This was then closed with  a 3-0 Vicryl in the dermal position as well as a 4-0 Monocryl in a  subcuticular fashion.  Dermabond was then placed over this.  She  tolerated this well and was then transferred to the recovery room in  stable condition.      Juanetta Gosling, MD  Electronically Signed     MCW/MEDQ  D:  11/02/2007  T:  11/02/2007  Job:  [id number]

## 2010-06-06 NOTE — Op Note (Signed)
Heather [name], Heather [name]               ACCOUNT NO.:  [id number]   MEDICAL RECORD NO.:  [id number]          PATIENT TYPE:  INP   LOCATION:  A340                          FACILITY:  [facility]   PHYSICIAN:  R. Roetta Sessions, M.D. DATE OF BIRTH:  [birth year]   DATE OF PROCEDURE:  03/15/2006  DATE OF DISCHARGE:                               OPERATIVE REPORT   PROCEDURES:  Esophagogastroduodenoscopy followed by colonoscopy through  ostomy with biopsy.   INDICATIONS FOR PROCEDURE:  The patient is 70 year old lady status post  extended left hemicolectomy for colorectal cancer last fall.  She had  been admitted with nausea, anorexia, vomiting and some recent grossly  bloody stool output through her colostomy.  Diarrhea has been a problem  for her since surgery.  She was coagulopathic on Coumadin when she was  admitted.  Her INR yesterday was down to 1.4.  EGD and colonoscopy are  now being done.  This approach has discussed the patient at length.  Potential risks, benefits and alternatives have been reviewed, questions  answered.  Please see the documentation in the medical record.   PROCEDURE NOTE:  O2 saturation, blood pressure, pulse and respirations  were monitored throughout the entire procedure.  Conscious sedation:  Versed 3 mg IV, Demerol 50 mg IV in divided doses.  Instrument:  Pentax  video chip system.   FINDINGS:  EGD:  Examination of the tubular esophagus revealed a 2 x 2  mm area of localized excoriations just above the EG junction on the  esophageal side.  Esophageal mucosa otherwise appeared normal.  EG  junction was easily traversed.   Stomach:  Gastric cavity was empty and insufflated well with air.  A  through examination of the gastric mucosa and a retroflex view of the  proximal stomach and esophagogastric junction demonstrated no  abnormalities.  Pylorus was patent and easily traversed.  Examination of  the bulb and second portion revealed no abnormalities.   Therapeutic/diagnostic maneuvers performed:  None.   The patient tolerated the procedure well and was prepared for  colonoscopy.  The pediatric colonoscope was utilized for colonoscopy and  digital exam of the ostomy was performed.  There did appear to be some  stenosis at the stoma.   Endoscopic findings:  The prep was relatively poor.  Quite a bit washing  and suctioning had to be done to gain adequate visualization of the  upstream colon from the ostomy.  The scope was fairly easily advanced to  the cecum, ileocecal valve and appendiceal orifice.  These structures  were well-seen and photographed for the record.  In and about the cecum  were multiple 3-4 mm ulcers which had a black, granular eschar overlying  them, which I was able to wash away.  Although this material was  adherent there was no visible vessel, but the mucosa appeared to be  denuded in this area.  Biopsies were taken.  In addition, as the scope  was slowly withdrawn there was a larger area of ulceration with  overlying yellow exudate more distally, approximately 10 cm upstream of  the ostomy, please see  photos.  This area was biopsied multiple times  and finally there appeared to be some granulation-like tissue just below  the ostomy, which was biopsied separate from the other lesions.   There was no obvious polyp or neoplasm.  The patient tolerated both  procedures well and was reacted in endoscopy.   IMPRESSION:  1. Localized excoriated area of distal esophageal mucosa (query      Mallory-Weiss tear healing).  Otherwise normal esophagus, stomach,      D1, d2.  2. Colonoscopy findings:      a.     Through colostomy, poor prep.      b.     The patient a multiple abnormalities:  Multiple 3-4 mm areas       of excoriation, ulceration about the cecum, ileocecal valve,       biopsied.  A larger area of ulceration with exudate 10 cm upstream       of the ostomy, biopsied; and finally, granulation-like-appearing        mucosa just inside the ostomy was also biopsied.      c.     No obvious polyp or neoplasm.   RECOMMENDATIONS:  1. The patient is hungry and wants to eat.  Will advance her diet.  2. Follow up on pathology.  3. Further recommendations to follow.  Further specific      recommendations on resumption of Coumadin to follow in the near      future.      Jonathon Bellows, M.D.  Electronically Signed     RMR/MEDQ  D:  03/15/2006  T:  03/16/2006  Job:  [id number]   cc:   Ladona Horns. Mariel Sleet, MD  Fax: [phone]   Barbaraann Barthel, M.D.  Fax: [phone]

## 2010-06-06 NOTE — Procedures (Signed)
NAMECIDNEY, KIRKWOOD               ACCOUNT NO.:  [id number]   MEDICAL RECORD NO.:  [id number]          PATIENT TYPE:  INP   LOCATION:  A202                          FACILITY:  [facility]   PHYSICIAN:  Gerrit Friends. Dietrich Pates, MD, FACCDATE OF BIRTH:  [birth year]   DATE OF PROCEDURE:  10/26/2005  DATE OF DISCHARGE:                                  ECHOCARDIOGRAM   REFERRED BY:   CLINICAL DATA:  A 70 year old woman with hypertension and bilateral  pulmonary embolism. M-mode aorta 3.2, left atrium 3.8, septum 1.4, posterior  wall 1.1, LV diastole 3.9, LV systole 2.7, RV diastole 3.4.   1. Technically adequate echocardiographic study.  2. Left atrial size at the upper limit of normal; normal right atrium.  3. Normal right ventricular size; mild to moderate hypertrophy; normal      systolic function.  4. Trileaflet and normal aortic valve.  5. Normal mitral valve; mild annular calcification.  6. Normal pulmonic valve and proximal pulmonary artery.  7. Normal left ventricular size; mild hypertrophy; normal regional and      global function.  8. Normal IVC.  9. Contrast study was technically suboptimal; no abnormalities noted.      Gerrit Friends. Dietrich Pates, MD, [facility]  Electronically Signed     RMR/MEDQ  D:  10/26/2005  T:  10/27/2005  Job:  [id number]

## 2010-06-06 NOTE — Group Therapy Note (Signed)
Heather [name], VANSTONE               ACCOUNT NO.:  [id number]   MEDICAL RECORD NO.:  [id number]          PATIENT TYPE:  INP   LOCATION:  A202                          FACILITY:  [facility]   PHYSICIAN:  Margaretmary Dys, M.D.DATE OF BIRTH:  [birth year]   DATE OF PROCEDURE:  10/24/2005  DATE OF DISCHARGE:                                   PROGRESS NOTE   SUBJECTIVE:  The patient is doing okay, would like to get up and move around  a little more.  Apparently the patient gets short of breath.  When I checked  the oxygen level on room air, it was 79%.  The cause of hypoxemia is  unclear, although the patient smokes a fair amount.  She was never told that  she had COPD, and was never admitted prior to her surgery.   OBJECTIVE:  Conscious, alert, comfortable not in acute distress.  VITAL SIGNS:  Blood pressure 144/76, pulse of 78, respiration 24.  T-MAX is  99.5, oxygen saturation was 92% on 3 liters, was 77% on room air.  HEENT:  Normocephalic, atraumatic.  LUNGS:  Clear. .  ABDOMEN:  Soft.  The area of colostomy was draining without trouble.  EXTREMITIES:  No edema.   LABORATORY/DIAGNOSTIC DATA:  A blood gas obtained shows a pH of 7.47, PCO2  of 36, PO2 was 40, oxygen saturation was 77.5%, this was on room air, and  this is consistent with the pulse  oximeter.  Sodium 135, potassium 3.4,  chloride of 102, CO2 27, glucose of 200, BUN of 2, creatinine was 0. 8, ,  calcium was 7.8.   ASSESSMENT AND PLAN:  Hypoxic respiratory failure of unclear etiology.  The  patient had recent surgery, and does not have any significant atelectasis on  her  x-rays.  I will proceed with obtaining a CT scan of the chest to rule  out a pulmonary embolus, and to further look at her lung architecture.  We  will also get a 2-dimensional echocardiogram to rule out possibility of  intracardiac shunt.   The patient is overall surprisingly stable, and not markedly dyspneic.  We  will continue current treatment for her  hypertension and diabetes.      Margaretmary Dys, M.D.  Electronically Signed     AM/MEDQ  D:  10/24/2005  T:  10/25/2005  Job:  [id number]

## 2010-06-06 NOTE — Op Note (Signed)
NAMEPERCY, COMP               ACCOUNT NO.:  [id number]   MEDICAL RECORD NO.:  [id number]          PATIENT TYPE:  INP   LOCATION:  IC03                          FACILITY:  [facility]   PHYSICIAN:  Barbaraann Barthel, M.D. 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   POSTOPERATIVE DIAGNOSIS:  Obstructing neoplasm of the left colon   PROCEDURE:  1. A colectomy with an end-transverse colostomy  2. Incidental appendectomy.   INDICATIONS FOR PROCEDURE:  This is a 70 year old white female who came into  the emergency room late last night with distension, obstipation, nausea and  vomiting.  She was found on CT scan to have what appeared to be an area of  obstruction in the left colon with dilated loops of bowel and pneumatosis  coli encountered.  She clinically had a normal white count.  Her abdomen was  somewhat distended.  She had no guaiac=positive stools and a white count of  9.5, with a minimal left shift.  She was hydrated.  Antibiotics were  initiated and on follow-up films we did a Gastrografin enema and encountered  an obstructing lesion in the left colon today.  We then discussed the need  for surgery on an emergency basis, as this patient had a quite dilated  transverse colon and cecum.  We discussed complications, not limited to but  including bleeding, infection and leaking and the possibility of the need  for the possibility this colostomy might be permanent.  An informed consent  was obtained.   GROSS OPERATIVE FINDINGS:  The patient had an obstructed lesion in the left  colon below the splenic flexure that was adherent to this psoas muscles and  deep.  There was no other spread anywhere to the other bowel.  This patient  had extensive biliary surgery.  I did not  appreciate any liver metastasis;  however, this was a frozen abdomen in the upper portion there and I did not  look heroically in this area to see for any liver metastasis,  None were  seen on the CT scan.  The patient had a grossly dilated transverse colon,  very attenuated and unhealthy-looking bowel in this area.  A long appendix  which I removed incidentally after the main important part of the surgery  was done.  No pneumatosis coli was encountered.   SPECIMENS:  Labeled as follows:  Transverse colon, uninvolved with tumor,  with a long GI #3-0 GI silk suture placed on the distal portion of  that  resection.  Left colon with a suture placed distally, and this was the  portion with the tumor involved.  And also an appendectomy.   DESCRIPTION OF PROCEDURE:  The patient was placed in the supine position  after the adequate administration of general anesthesia via endotracheal  intubation.  Her entire abdomen was prepped with Betadine solution and  draped in the usual manner. An  NG tube and a Foley catheter had been  previously placed.  The abdomen was opened through the midline.  This was a  very tedious procedure as this patient had multiple surgeries in the past.  There was also a repair of an incisional hernia in this area and no obvious  use of mesh here. although her fascial line had  multiple Prolene sutures  where a hernia had been and then flexed.  Then after a tedious dissection,  we were able to identify the area of the obstruction.  We removed this using  a GIA stapling device so that there was absolute no spillage.  I also  resected a portion of the transverse colon  that appeared very attenuated  and unhealthy for me.  I obviously did not want to try any anastomosis in  this setting of unobstructed and unprepared colon.  In each of these cases  the colon was removed using a and a GIA stapler, and there was no spillage.  After the resection had taken place, I explored the  abdomen and there were  no other findings of any spread of the tumor.  As stated, I was unable to  visualize and palpate the entire liver. The gallbladder obviously was absent  from its previous common bile duct surgery in the 1980s.  Also, this patient  has had a previous hysterectomy, TAH/BSO.  I closed the abdomen with through-  and-through sutures of #1-0 Novofil and #1-0 Prolene.  Prior to this I had  placed the transverse colon through an incision to the right of midline in  the upper portion of her abdomen.  This was placed through an opening in the  fascia there, and after we had closed the midline incision and isolated with  an OpSite dressing, I then opened the colostomy and matured this after  suturing this with #3-0 Polysorb to the fascia.  With everything isolated,  we then opened the staple line and had the suction ready  and removed a  copious amount of liquid stool from this area.  We then matured the  colostomy, suturing the dermis with the seromuscular layer of the transverse  colon with #4-0 chromic. a Hollister appliance was applied.  Prior to  closure all sponge, needle and instrument counts were found to be correct.  We did have an incident with the needles. where there was an equal number of  needles and packages; however, the count given preoperatively was one more.  We obtained an x-ray, and there was obviously no needle left behind, after  discussing this with the radiologist and going with the smallest needle that  we used while the abdomen was opened, and there was retained needle  on the  portable film.   Once this portable film was obtained, the patient was then awakened and  taken to the recovery room in satisfactory condition.  The patient lost  approximately 250 mL of blood.  She received 5000 mL of crystalloids  intraoperatively.  No drains were placed.   She will be taken to the intensive care unit.  We will check her blood sugar and her counts in the  recovery room. I will also obtain a consultation with  the [facility], who saw her cardiac-wise previously and whom she  recognizes as her cardiologists.  I have discussed this with Ms. Theodore Demark, PA-C, who is their PA, and she will make sure that her cardiac  status will be monitored.  I have also  asked Dr. Lionel December who sees her  medically. and for the hospitalist to see her Coralie Carpen is in the hospital,  in the ICU.   The procedure went well.  The patient was taken to recovery room in  satisfactory condition.  There were no complications.      Barbaraann Barthel, M.D.  Electronically Signed     WB/MEDQ  D:  10/19/2005  T:  10/20/2005  Job:  [id number]   cc:   Lionel December, M.D.  [address]  [place]  [place] [address]   Hospitalists' Team   [facility]   Rhae Lerner. Margretta Ditty, M.D.  [address]  [place]  [place] [address]

## 2010-06-06 NOTE — Consult Note (Signed)
[name], [name]               ACCOUNT NO.:  [id number]   MEDICAL RECORD NO.:  [id number]          PATIENT TYPE:  INP   LOCATION:  A340                          FACILITY:  [facility]   PHYSICIAN:  R. Roetta Sessions, M.D. DATE OF BIRTH:  1940/11/19   DATE OF CONSULTATION:  03/11/2006  DATE OF DISCHARGE:                                 CONSULTATION   REASON FOR CONSULTATION:  Gastrointestinal bleed.   HISTORY OF PRESENT ILLNESS:  [name] is a pleasant 70 year old  Caucasian female with numerous medical problems admitted to the hospital  today, March 11, 2006, with a history of stage III colon cancer in  the midst of receiving oral and IV chemotherapy for failure to thrive,  generalized weakness.  She has been having non-bloody diarrhea for  several weeks, for which Dr. Mariel Sleet has been giving her Imodium,  reportedly.   This nice lady was admitted briefly in December with nausea and  vomiting, and this was felt to be a viral gastroenteritis.  Those acute  symptoms resolved.  She was seen by Dr. Cira Servant.  She has, as stated  above, had problems over the past 2 months with diarrhea.  Up until  today, she has not seen any blood in her colostomy.  She has not had any  upper GI tract symptoms such as odynophagia, dysphagia, early satiety,  reflux symptoms or recent nausea or vomiting.  Her daughter tells me  that she had significant problems acknowledging/dealing with her  colostomy, and because of the increased stool output, she has recently  backed off on her oral intake in the hopes that this would diminish her  colostomy output.   It is noteworthy she has been on Coumadin for DVT and pulmonary embolus.  Apparently, Dr. Karilyn Cota found her INR to be high the first of the week  and instructed her to hold Coumadin, which she did.  However, on  admission today, her INR was found to be 4.9.  Today, she is noted to  have some serosanguineous bloody dark fluid coming out of her  colostomy  into the bag.  Today, her hemoglobin and hematocrit is 14.4 and 43.6,  white count 7.6, platelet count 372,000.  She really denies any  abdominal pain recently.   This lady has an extensive past medical history.  She underwent a  laparotomy with extended left hemicolectomy back in October when she  presented with an obstructing left-sided colorectal cancer which was  found to be stage III.  As stated above, she has seen Dr. Mariel Sleet.  Of  note, she has not had an optical colonoscopy recently.  Her daughter  reports her last colonoscopy was some 11 years ago, and as I can piece  it together, she has not had her residual colon optically imaged in some  11 years.  She remains hemodynamically stable.   PAST MEDICAL HISTORY:  1. Significant for stage III colon cancer status post extended left      hemicolectomy and colostomy in October of 2007.  2. History of insulin-dependent diabetes mellitus.  3. Hypertension.  4. Hyperlipidemia.  5. Hypothyroidism.  6. History of pulmonary embolus and DVT.   PAST SURGICAL HISTORY:  1. Cholecystectomy back in the 1980s.  There is a report in the      medical record of an ERCP possibly due to common duct stone      disease.  2. History of ventral hernia repair.  3. Back surgery.  4. Benign cyst removal from the breast.  5. Knee surgery.   It is notable I am told this lady has a 5 volume chart in our office,  and all of her medical records are not available for me to review at  this time.   MEDICATIONS ON ADMISSION:  1. Metoprolol.  2. Zetia.  3. Levothyroxine.  4. Potassium.  5. Vasotec.  6. Clonidine.  7. Coumadin 2 mg daily (recently held).  8. Lantus insulin.   ALLERGIES:  1. IODINE.  2. MOTRIN.  3. QUININE.  4. SULFA.   She denies any nonsteroidal use.   FAMILY HISTORY:  No history of chronic GI or liver illness.   SOCIAL HISTORY:  The patient lives with her husband and her son.  No  alcohol.  She ambulates with a  cane and is able to get around and do  quite a bit with the assistance of her family.   REVIEW OF SYSTEMS:  Has not had any abdominal pain.  No chest pain.  No  dyspnea.  She has a minimal inferior laparotomy wound dehiscence, which  is getting packed daily.   PHYSICAL EXAMINATION:  GENERAL:  A somewhat chronically ill 70 year old  lady resting comfortably.  VITAL SIGNS:  Temperature 97.5, pulse 80, respiratory rate 20, blood  pressure 103/63.  SKIN:  Warm and dry.  There is no jaundice or continued stigmata of  chronic liver disease.  HEENT:  Sclerae are non-icteric.  Conjunctivae pink.  Oral cavity with  no lesions.  CHEST:  Lungs are clear to auscultation.  CARDIAC:  Regular rate and rhythm without murmur, gallop, or rub.  ABDOMEN:  Nondistended.  She has a laparotomy scar healing.  She has a  colostomy coming out of her right abdominal wall.  Positive bowel  sounds.  Soft.  She has dark, grossly bloody liquid stool in her  colostomy bag.  EXTREMITIES:  No edema.   ADDITIONAL LABORATORY DATA:  INR of 4.9.  Sodium 132.  Potassium was 3,  now 3.3.  Chloride 104, CO2 of 22, glucose 105, BUN 22, creatinine 1.98  (down from 2.32), total bilirubin 0.6, alkaline phosphatase 121 (down  from 133), SGOT 38, SGPT 21, albumin 2.7, calcium 8.0.   Heather [name] is a pleasant 70 year old lady admitted to the  hospital with history of stage III colon cancer in the midst of  chemotherapy, now with failure to thrive.  She is coagulopathic on  Coumadin, which has been held.  Its interesting to note her daughter  reports [name] has developed an aversion to eating as it seems to  make her diarrhea worse.  Diminished oral intake recently likey explains  her high INR.  Her diarrrhea may well be related, in part, to recent  chemptherapy.   She is now noted to have some bloody output through her colostomy.  This lady remains hemodynamically stable, and her admission hemoglobin  and  hematocrit looks good.   I am somewhat concerned that she has not had her residual colon imaged  for some 11 years.  She certainly could be harboring an occult lesion,  which has bled in the setting of over anticoagulation.  Stool in her  colostomy is somewhat dark, and, therefore, we certainly need to be  concerned about the possibility of an upper GI tract origin.   RECOMMENDATIONS:  1. Agree with instituting a proton pump inhibitor empirically.  2. I would favor a colonoscopy and leave her ostomy to image her      residual colonic mucosa along with an EGD at some point in the near      future while she is hospitalized once her INR is less than or equal      to 1.6.  Given her history of recent DVT, pulmonary embolus would      not aggressively reverse her INR, as long as she is not overtly      bleeding and remains hemodynamically stable.  3. Hemoglobin and hematocrit may well drop when it is checked again.  4. check C diff toxin assay on stool.  5. Further recommendation to follow.   It appears that I do not have all of her medical records on the hospital  database.  Will review her copious office medical records in the very  near future.   I have discussed my findings and recommendations with Dr. Benson Setting, as  well as Ms. Bicknell and her daughter this afternoon.   I would like to thank Dr. Benson Setting for allowing me to see this nice lady  this afternoon.      Jonathon Bellows, M.D.  Electronically Signed     RMR/MEDQ  D:  03/11/2006  T:  03/11/2006  Job:  [id number]   cc:   Ladona Horns. Mariel Sleet, MD  Fax: [phone]   Barbaraann Barthel, M.D.  Fax: [phone]   Lionel December, M.D.  [address]  [place]  [place] [address]

## 2010-06-06 NOTE — Discharge Summary (Signed)
Heather [name], Heather [name]               ACCOUNT NO.:  [id number]   MEDICAL RECORD NO.:  [id number]          PATIENT TYPE:  INP   LOCATION:  A340                          FACILITY:  [facility]   PHYSICIAN:  Skeet Latch, DO    DATE OF BIRTH:  December 19, 1940   DATE OF ADMISSION:  03/10/2006  DATE OF DISCHARGE:  02/28/2008LH                               DISCHARGE SUMMARY   Primary care physician is Lionel December, MD.  Oncologist is Ladona Horns.  Mariel Sleet, MD.  Surgeon is Barbaraann Barthel, MD.   DISCHARGE DIAGNOSES:  1. Acute gastrointestinal bleed.  2. Acute gastritis possibly secondary to chemotherapy.  3. Stage III colon cancer.  4. Hypothyroidism.  5. Insulin-dependent diabetes.  6. Hypertension.   BRIEF HOSPITAL COURSE:  Heather [name] is a 69 year old Caucasian female  who presented with generalized weakness and unable to eat solids times  several days.  The patient presented complaining of some nausea,  vomiting and malaise times a few days.  The patient has a history of  colon cancer and has been receiving chemotherapy that started back in  October 2007.  Back in October 2007 the patient was diagnosed with stage  III colon cancer and underwent a colectomy and colostomy placement at  that time.  At that time she developed acute pulmonary embolism and was  started on Coumadin therapy, which was stopped approximately a week  before presenting to the emergency room.  The patient was seen and  evaluated and due to her history of colon cancer, it was decided the  patient needed to be admitted for evaluation.  The patient was admitted  for evaluation and testing.  The patient was placed on her home  medications.  A GI consult was obtained due to serosanguineous fluid in  the patient's colostomy bag.  Dr. Mariel Sleet and Dr. Malvin Johns were also  consulted secondary to her colon cancer and colon resection.  The  patient's Coumadin was discontinued during the hospital stay.  The  patient underwent an EGD  followed by a colonoscopy through her ostomy  with a biopsy.  The patient was found to have a localized excoriated  area of her distal esophageal mucosa and seen to have a Mallory-Weiss  tear that was healing.  Colonoscopy, even though her prep was poor,  showed areas of excoriation and ulceration around the cecum and  ileocecal valve.  The patient did have a biopsy performed, which showed  a focal colitis and granulation tissue.  The patient has improved, has  no complaints.  The patient is afebrile and ready for discharge.   DISCHARGE MEDICATIONS:  1. Levothyroxine 125 mcg p.o. daily.  2. Protonix 40 mg daily.  3. Metoprolol 50 mg b.i.d.  4. Zetia 10 mg daily.  5. Vasotec 10 mg daily.  6. Clonidine 0.1 mg p.o. b.i.d.  7. Potassium chloride 20 mEq p.o. daily.  8. Imodium 2 mg p.o. b.i.d.  9. Lantus 30 units q.h.s.  10.Coumadin 2 mg p.o. daily.   The patient is instructed to follow up with Dr. Karilyn Cota in 1 week.  The  patient is also instructed  to follow up with Dr. Mariel Sleet also in 1-2  weeks.  The patient is to maintain a 4 g sodium diet.  She is to  increase her activity slowly as tolerated.  The patient is to continue  treating her colostomy as directed.      Skeet Latch, DO  Electronically Signed     SM/MEDQ  D:  03/18/2006  T:  03/18/2006  Job:  [id number]   cc:   Lionel December, M.D.  [address]  [place]  [place] [address]   Ladona Horns. Mariel Sleet, MD  Fax: [phone]   Barbaraann Barthel, M.D.  Fax: [phone]

## 2010-06-06 NOTE — Group Therapy Note (Signed)
[name], [name]               ACCOUNT NO.:  [id number]   MEDICAL RECORD NO.:  [id number]          PATIENT TYPE:  INP   LOCATION:  A340                          FACILITY:  [facility]   PHYSICIAN:  Heather [name], M.D.DATE OF BIRTH:  [birth year]   DATE OF PROCEDURE:  03/14/2006  DATE OF DISCHARGE:                                 PROGRESS NOTE   SUBJECTIVE:  The patient feels much better.  Colostomy bag continues to  drain but it is no longer bloody.  She denies any fevers or chills.  She  feels a little bit stronger.  She is able to ambulate to the bathroom  without trouble.   No nausea or vomiting.  Appetite has improved.   OBJECTIVE:  Conscious, alert, comfortable, not in acute distress.  Vital  signs were stable.  HEENT EXAM:  Normocephalic and atraumatic.  Oral mucosa moist.  Neck  supple.  No JVD, no lymphadenopathy.  LUNGS:  Clear clinically.  ABDOMEN:  Soft.  Colostomy bag draining yellowish liquid consistent with  an ileostomy drain.  EXTREMITIES:  No edema.   LABORATORY DATA:  INR is down to 1.4 today.  H&H is stable from  yesterday.   ASSESSMENT/PLAN:  Probable gastrointestinal bleed secondary to elevated  INR:  INR is now at 1.4.  The patient is no longer bleeding.  We will  have discuss with Dr. Mariel Sleet if we need to restart her back on the  Coumadin.  Her weakness is better and her electrolytes are corrected.  Diabetes is also better controlled.  Overall, the patient is deemed much  better.  We will continue current care.  We will initiate physical  therapy as soon as it is available.   DISPOSITION:  I anticipate she might be able to go home in the next 1-2  days.      Heather [name], M.D.  Electronically Signed     AM/MEDQ  D:  03/14/2006  T:  03/14/2006  Job:  [id number]

## 2010-06-06 NOTE — Consult Note (Signed)
[name], [name]               ACCOUNT NO.:  [id number]   MEDICAL RECORD NO.:  [id number]          PATIENT TYPE:  INP   LOCATION:  IC03                          FACILITY:  [facility]   PHYSICIAN:  Lionel December, M.D.    DATE OF BIRTH:  1940-07-14   DATE OF CONSULTATION:  10/19/2005  DATE OF DISCHARGE:                                   CONSULTATION   REASON FOR CONSULTATION:  Abdominal pain, dilated colon, question of colonic  mass.  Please note the patient was on Dr. Malvin Johns service.   HISTORY OF PRESENT ILLNESS:  Heather [name] is a 70 year old Caucasian female who has  been under my care for her medical problems and was last seen in July 2007  when she was doing well, except for hemoglobin A1c had gone up to 9 as  discussed below.  She was in her usual state of health until last week.  She  was stable and all of a sudden she stopped having bowel movements;  she  developed constipation and also could not pass any.   Heather [name] was in her usual state of health until last week, Tuesday, when she  found herself constipated and did not pass any flatus.  This was very  unusual because she would have bowel movement every day.  She also began to  have pain in the right lower quadrant of her abdomen.  Over the next 2 days,  her pain got worse and she also developed abdominal distension.  Two days  prior to coming to the emergency room, she took a Fleet enema and she had  minimal results.  Yesterday, she developed nausea and vomiting.  She  therefore came to emergency room in the evening and was initially evaluated  by Dr. Lillia Carmel.  She had abdominopelvic CT which showed marked  dilation of colon down to level of the junction of the descending and  sigmoid colon.  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  She had mildly dilated proximal small bowel.  Dr. Malvin Johns was  consulted.  The patient was admitted to his service, NG tube was placed and  she was begun on Cipro and  metronidazole.  Dr. Malvin Johns asked me to assist  in further management.   This morning, she feels better.  Her abdominal distension is decreased and  she is having less pain, which is primarily in the right lower quadrant.  She still not passing flatus or feces.  Review of systems is negative for  anorexia or weight loss.  She also denies melena or rectal bleeding.  Over  the last 2 years, I have asked her to undergo a screening colonoscopy, but  which she has declined.  She did have a colonoscopy in September 1996, which  was negative, and back in 1983, she had a sigmoidoscopy by Dr. Welton Flakes, which  was also negative.   MEDICATIONS AT HOME:  1. ASA 81 mg daily.  2. Metoprolol 50 mg b.i.d.  3. Synthroid 125 mcg daily.  4. Zetia 10 mg daily.  5. Kay Ciel OTC 550 mg daily.  6. Novolin 70/30 18 units in the a.m., 18 in p.m.  7. Vasotec 10 mg daily.  8. Lasix 20 mg daily.  9. Klor-Con 20 mEq daily.   MEDICATIONS IN HOSPITAL:  1. She is on NovoLog via sliding scale.  2. Toradol 50 mg IV q.6 h.  3. Zofran 4 mg IV q.8 h. p.r.n.  4. Cipro 400 mg IV q.12 h.  5. Dilaudid 2 mg.  6. Metronidazole 500 mg IV q.6 h.   PAST MEDICAL HISTORY:  She has been diabetic for a number of years.  Her  control has been up and down.  Her hemoglobin in January 2007 was 6.4, but  in March was 7.1 and more recently on August 04, 2005, was 9.7.   She has hypertension and hypothyroidism.  Last year, TSH was 0.20 and her  Synthroid dose was decreased.  She has hyperlipidemia and has been  intolerant of statins.  On her last visit, it was suggested she try Crestor  2.5 mg daily, which she still may be taking.   She has severe aortoiliac atherosclerosis with some plaques.  I feel that  she has peripheral vascular disease, although she has not had typical  intermittent claudication; she has had diminished walking ability and  weakness.  She has been seen by Dr. Tawanna Cooler Early initially in August 2004.  Earlier this  year, I made an appointment for her to seen, but she decided  not to go for a followup visit.   PRIOR SURGERIES:  Include cholecystectomy in 1982 or 1983 and she had a  common duct stone for which she was sent to [facility] for ERCP with stone  removal.  She has had BSO with hysterectomy and she had repair of incisional  hernia on 2 occasions.   ALLERGIES:  IBUPROFEN, SULFA, IODINE and she has been intolerant of STATIN  DRUGS.   FAMILY HISTORY:  Negative for colorectal carcinoma.   SOCIAL HISTORY:  She is married.  She is a housewife.  She has been smoking  cigarettes for years and now down to less than a pack a day.  She does not  drink alcohol.   PHYSICAL EXAM:  GENERAL:  A pleasant, well-developed, well-nourished  Caucasian female who is in no acute distress.  VITAL SIGNS:  Admission weight 160 pounds.  She is 5-feet 4-inches tall.  Pulse 91 per minute.  Blood pressure 162/78.  Temperature is 97.3.  Respirations 17.  HEENT:  Conjunctivae are pink.  Sclerae are anicteric.  She has xanthelasma  over her upper and lower eyelids.  Oropharyngeal mucosa is normal.  NECK:  No neck masses or thyromegaly noted.  CARDIAC:  Regular rhythm.  Normal S1 and S2.  No murmur or gallop noted.  LUNGS:  Clear to auscultation.  ABDOMEN:  Full.  Bowel sounds are hypoactive.  Palpation reveals soft  abdomen with fullness in right lower quadrant where she has mild tenderness,  no guarding or rebound noted.  RECTAL:  Examination deferred, as she had one by Dr. Malvin Johns.  EXTREMITIES:  No peripheral edema or clubbing noted.   CT FINDINGS:  As above.   LABORATORIES ON ADMISSION:  WBC 9.5, H&H are 13.4 and 41.4, MCV 74.1,  platelet count 460, 000.  Her sodium was 131, potassium 3.9, chloride 96,  CO2 25, glucose 420, BUN 25, creatinine 1.1, bilirubin 1.0, AP 119, AST 17,  ALT 12, total protein 7.7, albumin 3.3, calcium 8.7.  Her amylase is 31, lipase 16.  Urinalysis reveals  3-6 wbc's and she has 15 mg/dL of  ketones.   I have reviewed abdominopelvic CT with Dr. Anselmo Pickler.   ASSESSMENT:  [name] is a 70 year old Caucasian female who presents with acute  onset of obstipation.  Her last colonoscopy was over 11 years ago and she  recently has declined to undergo on for screening purposes.  Review of the  CT  suggests there is a mass at the junction of descending and sigmoid colon.  Her colon proximal to this is dilated, therefore obstructing neoplasm or  apple core is very likely.  I suspect her pneumatosis is either secondary or  possibly a false-positive finding, as she does not appear to be toxic or  acutely ill.   RECOMMENDATIONS:  Gastrografin enema in a.m. or as soon as possible.  If  obstruction is documented, Dr. Malvin Johns could proceed with surgery.  If  Gastrografin enema is negative, would consider a sigmoidoscopy or and an  unprepped colonoscopy.   We would like to thank Dr. Malvin Johns for the opportunity to participate in  the care of this nice lady.      Lionel December, M.D.  Electronically Signed     NR/MEDQ  D:  10/19/2005  T:  10/20/2005  Job:  [id number]

## 2010-06-06 NOTE — Consult Note (Signed)
NAMEJAZMINN, POMALES               ACCOUNT NO.:  [id number]   MEDICAL RECORD NO.:  [id number]          PATIENT TYPE:  INP   LOCATION:  A202                          FACILITY:  [facility]   PHYSICIAN:  Ladona Horns. Mariel Sleet, MD  DATE OF BIRTH:  February 21, 1940   DATE OF CONSULTATION:  10/26/2005  DATE OF DISCHARGE:                                   CONSULTATION   New patient consultation.   Her other doctor is Dr. Margaretmary Dys, and he is on the hospital staff.   DIAGNOSIS:  1. Stage III adenocarcinoma of the colon presenting with bowel obstruction      symptomatology.  She had one positive node at the time of exploration.  2. Pulmonary emboli postoperatively, now on Lovenox.  3. Anemia at the time of presentation.  4. Diabetes mellitus.  5. History of smoking, quitting just prior to her admission, and she      smokes approximately a pack of cigarettes a day and has done so for      many years.  6. Allergy to sulfonamides, ibuprofen and iodine.  7. History of a partial hysterectomy years ago for benign reasons.  8. Right knee arthroscopic surgery in the past.  9. Bladder suspension surgery in the past.  10.Hypertension.  11.Hypothyroidism.  12.Cholecystectomy in the early 1980s, but she did require endoscopic      retrograde cholangiopancreatography at one time for retained common      bile duct stone.  13.History of bilateral salpingo-oophorectomy with repair of an incisional      hernia on two occasions.   MEDICATION LIST:  Which included on admission:  1. Aspirin 81 mg.  2. Metoprolol 50 mg twice a day.  3. Levothyroxine 0.125 mg a day.  4. Ezetimibe 10 mg a day.  5. KCl once a day.  6. Insulin Novolin 70/30 18 units a.m. and p.m. .  7. Enalapril 10 mg a day.  8. Lasix 20 mg a day.   Her primary doctor is Dr. Lionel December.   This is a very pleasant lady who is 70 years old who looks, I think, older  than her stated age who was admitted abruptly with the onset of abdominal  pain.  It started only a day or two before admission.  She was found to have  bowel obstruction and eventually came to be realized that she had an  obstructing lesion.   She was found to have an obstructing lesion in left colon at the time of  surgery.  She had a colectomy with an end transverse colostomy and an  incidental appendectomy.   She is recuperating now also from bilateral pulmonary emboli picked up on  Saturday evening.   Her breathing, she thinks, is still short but is slightly better.  She is on  oxygen around the clock.   She does not have any pain other than the incisional pain at this time.  She  denies chest pain.  There is no family history of colon cancer that she is  aware of.  She lives with her husband here locally.  She  has two children in  good health to the best of her knowledge.  One daughter and one son.   Her review of systems otherwise is noncontributory except for her diabetes  which she keeps under only fair control, it sounds like.  Her sugars are  somewhere around 140, sometimes less she states.  She does not remember what  her most recent hemoglobin A1c is.   Her exam shows a lady who is not febrile at this time.  Respirations were 20  and unlabored at rest.  She, of coarse, did have oxygen on.  Blood pressure  is 120/60.  Her pulse is right around 80, and it was regular.  Lymph nodes were negative throughout.  She had no obvious breast lesions.  LUNGS:  Showed some rhonchi bilaterally.  Her heart showed a regular rhythm and rate without murmur, rub or gallop.  ABDOMEN:  Showed the incision, colostomy; some bowel sounds were present.  She had no obvious masses.  She had puffiness of her legs but no pitting edema.  Pulses in her feet were  definitely diminished.  She had no obvious inguinal nodes.  She does have some stains on her right  side where her colostomy bag is leaking a little bit.  She has multiple missing teeth and what teeth remain are  in poor repair.  Pupils appear equally round and reactive to light.  She is alert and oriented.  She does have an acute problem postoperatively  with pulmonary emboli but is on appropriate therapy   She needs to be considered for adjuvant therapy, but I have told her that I  think her chances of this recurrence was somewhere around 40-50% without  therapy, since she only had 1 positive node that chemotherapy may help  reduce her risk by about 40% with the knowledge that we have today.   She will think about this, and we will discuss this probably with her  children and husband in the future.  She is probably not the best candidate  for chemotherapy since I think she has multiple medical problems and is not  the most vigorous individual, but we will see how she gets along after she  recuperates from his surgery and the pulmonary emboli.      Ladona Horns. Mariel Sleet, MD  Electronically Signed     ESN/MEDQ  D:  10/26/2005  T:  10/27/2005  Job:  [id number]   cc:   Lionel December, M.D.  [address]  [place]  [place] [address]

## 2010-06-06 NOTE — Consult Note (Signed)
Heather [name], Heather [name]               ACCOUNT NO.:  [id number]   MEDICAL RECORD NO.:  [id number]          PATIENT TYPE:  INP   LOCATION:  IC03                          FACILITY:  [facility]   PHYSICIAN:  Barbaraann Barthel, M.D. DATE OF BIRTH:  March 22, 1940   DATE OF CONSULTATION:  10/19/2005  DATE OF DISCHARGE:                                   CONSULTATION   REASON FOR CONSULTATION:  Surgery was asked to see this 70 year old white  female diabetic for abdominal pain. Chief complaint is that of abdominal  pain, nausea and vomiting.   HISTORY OF PRESENT MEDICAL ILLNESS:  The patient gives a rather confusing  story of abdominal pain that began vaguely Tuesday and then developed into  being somewhat worse on Friday and she came to the emergency room early  Monday morning.  Her complaints were that of basically that she had some  crampy pain and she also had some obstipation.  She gave herself an enema on  Friday as well as took some Dulcolax suppositories and she had very little  result from these.  She was not distended at the time that she gave herself  an enema, she simply had not had normal BM and she wanted to self medicate  this.  She has been passing gas today and she does not feel overly  distended.   She has not complained of any diarrhea.  She has not complained of any  change in her bowel habits or bright red rectal bleeding or any unexplained  weight loss.  She sees Dr. Karilyn Cota on a regular basis for medical attention  and he apparently monitors her blood sugar as well as sees her for other  medical problems and according to her performed a colonoscopy 2-3 years ago.   PAST SURGICAL HISTORY:  Open gallbladder surgery performed either by Dr.  Leona Carry or Dr. Marthe Patch in the 1980s. Apparently this also involved a  pancreatitis and a common bile duct exploration at that time.  This was then  complicated by an incisional hernia which was operated on and the patient  had two other  surgeries, hysterectomy, TAH-BSO.   PHYSICAL EXAMINATION:  GENERAL:  Discloses a pleasant 70 year old white  female who does not appear in any acute distress.  HEENT:  Her head is normocephalic. Eyes, extraocular movements are intact.  Pupils are round and react to light and accommodation.  There is no  conjunctive pallor or scleral injection.  Nasal and oral mucosa are somewhat  dry.  Jugular veins are flat.  There are no bruits auscultated.  No cervical  adenopathy appreciated.  No thyromegaly.  CHEST:  Heart rate is regular.  BREASTS:  Breasts and axilla are without masses.  LUNGS: Fairly clear.  ABDOMEN:  Bowel sounds are present.  They are somewhat diminished.  The  patient actually complains of more pain in her right lower quadrant than  anywhere else.  She has no femoral or inguinal hernias and I do not  appreciate any incisional hernias from these previous surgeries.  RECTAL:  There is scanty stool.  This is guaiac-negative.  EXTREMITIES:  Pulses are full and symmetrical.  There is some mild pretibial  edema bilaterally that is symmetrical.  The patient has a palpable dorsalis  pedis pulse.   REVIEW OF SYSTEMS:  GI:  The patient has had a previous gallbladder surgery  and a biliary exploration for what appeared be a common bile duct in the  past.  This was complicated by pancreatitis at that time.  No past history  of hepatitis. As mentioned above nausea and vomiting and some obstipation.  No history of bright red rectal bleeding or black tarry stools.  The patient  has had no real bowel movement since Friday.  The last time she ate a full  meal was Thursday.  She ate very little on Friday. GU:  The patient states  that she thinks she might have had nephrolithiasis in the past.  She has no  dysuria at present.  ENDOCRINE:  The patient is an insulin-dependent  diabetic and she also has a history of hypothyroidism.  CARDIORESPIRATORY:  The patient smokes a pack of cigarettes a  day and she has seen a  cardiologist in the past and states that she may have had an old myocardial  infarction that was not treated according to the cardiologist who saw her at  that time several years ago.  The patient is hypertensive.   MEDICATIONS:  1. Metoprolol tartrate 50 mg b.i.d.  2. Zetia 10 mg daily.  3. Crestor 10 mg a day.  4. Levothyroxine 112 mcg one daily.  5. Vasotec 10 mg daily.   She takes potassium supplement and an aspirin 81 mg on a daily basis.  She  also takes insulin, regular insulin.  She takes 18 units subcu daily.   VITAL SIGNS:  This patient 160+ pounds according to her and she is 5 feet 4.  Her blood pressure was 139/86, her temperature is 97.2, heart rate was 104,  respirations 22 per minute.   LABORATORY DATA:  Her white count is 9.5 with an H&H of 13.4 and 41.4,  platelet counts 460,000, neutrophils 78% noted. Her electrolytes are grossly  within normal limits with a mild hyponatremia of 131, her creatinine is 1.1  with a BUN of 25. Her liver function studies are all grossly within normal  limits with mild to alkaline phosphatase elevated at 119.  Her amylase and  lipase are not elevated.   CT scan showed some dilated loops of small bowel and colon and there  appeared to be an area of transition noted over on the left colon  progressing from the transverse colon. Of note also is pneumatosis coli  noted on the right bowel that we see progressing.   Clinically this patient appears very stable and without any real complaints  of abdominal pain except upon deep palpation.  She has been given some  medication in the emergency room and I will admit her and place an NG tube  and I will repeat this CT scan or acute abdominal series in the morning.  I  would like to have a medical consultation  for her multiple medical problems and I would like Dr. Patty Sermons input as he sees her apparently on a regular basis.  This patient will be admitted and I  will  follow her closely.  I will initiate antibiotics on her and will follow  her closely.      Barbaraann Barthel, M.D.  Electronically Signed     WB/MEDQ  D:  10/19/2005  T:  10/19/2005  Job:  [id number]   cc:   Lionel December, M.D.  [address]  [place]  [place] [address]

## 2010-06-06 NOTE — H&P (Signed)
Heather [name], Heather [name]               ACCOUNT NO.:  [id number]   MEDICAL RECORD NO.:  [id number]          PATIENT TYPE:  INP   LOCATION:  A318                          FACILITY:  [facility]   PHYSICIAN:  Osvaldo Shipper, MD     DATE OF BIRTH:  1940-06-06   DATE OF ADMISSION:  12/17/2005  DATE OF DISCHARGE:  LH                              HISTORY & PHYSICAL   PRIMARY CARE PHYSICIAN:  Lionel December, M.D.   GENERAL SURGEON:  Barbaraann Barthel, M.D.   ONCOLOGIST:  Ladona Horns. Neijstrom, MD   ADMITTING DIAGNOSES:  1. Nausea, vomiting, abdominal pain, possibly gastroenteritis.  2. Stage III colon cancer status post colectomy and colostomy      placement.  3. Incision site wound under surgeon's care.  4. Uncontrolled diabetes on insulin.  5. Uncontrolled hypertension, possibly rebound.  6. Dyslipidemia.  7. Hypothyroidism.  8. Pulmonary embolism diagnosed in October 2007.   CHIEF COMPLAINT:  Nausea, vomiting, and abdominal pain of 1 day  duration.   HISTORY OF PRESENT ILLNESS:  This patient is a 70 year old Caucasian  female whom I know from a previous admission back in October of 2007.  The patient was diagnosed with Stage III colon cancer and underwent a  colectomy and a colostomy placement at that time.  She also developed  acute pulmonary embolism during that admission and was started on  anticoagulation.   The patient mentioned that this morning she woke up around 2 o'clock  with severe upper abdominal pain and this was followed by nausea and  emesis.  She has thrown up more than 10-12 times today. The emesis was  greenish in color with no blood.  The pain was described as a sharp pain  in the upper abdomen radiating to the back.  The pain was 10/10 in  intensity and constant with no elevating or aggravating factors.  She  has never experienced such pain in the past.  The patient reports some  weight loss, but she is not able to quantify it.  She says that the  colostomy is working well  and she has a soft stool every day.  The last  change of her colostomy was earlier today.   She mentioned that she has some issues with her incision site and she  developed infection in that site.  This is being managed by Dr.  Malvin Johns.  She is having dressing changes twice a day for it.   MEDICATIONS AT HOME:  I have a feeling that the patient and her daughter  do not know the exact doses of some of these, but she is apparently on  the following:  1. Metoprolol 15 mg b.i.d.  2. Zetia 10 mg once a day.  3. Levothyroxine 125 mcg once a day.  4. Lasix 20 mg.  5. Potassium 20 mg as needed for swelling.  6. Clonidine patch 0.3 weekly to be changed on Tuesday, but she did      not put this on Tuesday.  7. Lantus 22 units q.h.s.  The patient also informed me that she did  not take her Lantus last night, as she ran out of it, and instead      took Novolin that she had lying at home.  8. Coumadin 2 mg q.p.m. this dose keeps changing, according to the      patient, depending on her INR.   It was noted from her previous discharge summary that she was discharged  on Vasotec as well, but she is not on that at this time.   ALLERGIES:  Include iodine, Motrin, Quinidine and Sulfa.   PAST MEDICAL HISTORY:  1. Diabetes on insulin.  2. Dyslipidemia.  3. Hypertension.  4. Hypothyroidism.  5. Stage III colon cancer status post colectomy and colostomy      placement.  6. Wound infection at the site of incision.   OTHER SURGICAL HISTORY INCLUDES:  1. Cholecystectomy.  2. Previous ventral hernia repairs.  3. Back surgeries for protuberant disk.  4. Bladder surgeries for incontinence.  5. She has had a cyst removed from her breast.  6. She has also had knee surgeries in the past.   SOCIAL HISTORY:  Lives with her husband and son.  Quit smoking in  October ever since her surgery.  No alcohol use, no illicit drug use.  She uses a cane to ambulate.   FAMILY HISTORY:  Noncontributory.  No  history of colon cancer in the  family.   REVIEW OF SYSTEMS:  GENERAL:  Weakness.  RESPIRATORY:  Unremarkable.  CARDIOVASCULAR:  Unremarkable.  GI:  Please see HPI.  ENDOCRINE:  Unremarkable.  MUSCULOSKELETAL:  Unremarkable.  NEUROLOGIC:  Unremarkable.  GENITOURINARY:  Unremarkable.  OTHER SYSTEMS:  Unremarkable.   PHYSICAL EXAMINATION:  VITAL SIGNS:  Temperature 97.3, blood pressure  was recorded 201/123, then subsequently 201/100, and then last reading  was 173/74.  Heart rate was initially in the 120s, currently about 90-  100.  It is regular.  Saturation 96%.  Respiratory rate is about 16.  GENERAL EXAM:  This is an obese, white female, very pleasant to talk to  in no distress.  HEENT:  There is no icterus.  Oral mucous membranes are dry.  No oral  lesions are noted.  NECK:  Soft and supple.  No thyromegaly is appreciated.  CARDIOVASCULAR:  S1-S2 normal, regular.  No murmurs appreciated.  LUNGS:  Clear to auscultation bilaterally.  ABDOMEN:  Soft.  At this point there is no tenderness that I can  illicit.  Bowel sounds are sluggish.  There is a colostomy bag on the  right side.  Incision site is noted in the midline of the abdomen.  There is about a 2-3 cm opening in part of this incision which goes into  the abdomen about 6-7 cm.  The surface of this wound site looks reddish  with granulation tissue.  EXTREMITIES:  Without edema, peripheral pulses are poor.   LABORATORY DATA:  Her white count today is 15,600 with 90% neutrophils.  Hemoglobin is 15.3 with an MCV of 77, platelet count is 530.  INR is  2.2, potassium 3.2, glucose is 452.  No anion gap is present.  Renal  function is normal. Urinalysis also reviewed.   Imaging studies:  Underwent acute abdominal series which was reported as  negative.   ASSESSMENT AND PLAN:  1. This is a 70 year old Caucasian female with medical problems as     mentioned earlier who presents with abdominal pain, nausea, and      vomiting.  2.  Abdominal pain, nausea, and vomiting.  The  sudden onset of pain was      followed by nausea and vomiting.  The etiology, at this time is not      clear; however, the differential includes gastroenteritis.  We      should also consider pancreatitis at this time.  Since the patient      is status post recent abdominal surgery and is having abdominal      incision site wound we should also worry about intra-abdominal      infection at this time.  She does have an elevated white count      though she is afebrile.  An intra-abdominal wound is somewhat less      likely because of the sudden onset of this pain.  Other      differentials to be considered are intra-abdominal vascular      abnormalities which can cause sudden abdominal pain.  Anyway we      will start off with getting a CT scan of the abdomen and pelvis      with contrast.  We will check an amylase and lipase and will start      her on Zosyn empirically and then take it from there.  3. Hypertension, uncontrolled, very high pressures.  This is likely      rebound hypertension from lack of Clonidine.  The patient choose      not to replace her Clonidine patch on Tuesday.  She removed her old      patch but did not put on a new patch because of some skin      irrigation.  We will start all of her medications back.  I am going      to start her on Vasotec which will help with her diabetes and be      renal protective.  4. Hypothyroidism, continue Levothyroxine.  5. Hypokalemia.  We will replace her potassium through the IV route.  6. Diabetes, poorly controlled, probably because of the lack of Lantus      over the past day.  We will give her one dose of Lantus now and      then do CBGs q.a.c. and h.s. and cover her with a sliding scale.  7. Acute pulmonary embolism, diagnosed in October, considering her      colon cancer she will probably need to be on anticoagulation for a      long time.  For now, we will continue her Coumadin.  8.  Liver function tests will be checked.  9. Further management decisions will be based on results of further      testing, and the patient's response to treatment.  10.Regarding her colon cancer, the patient has not yet been started on      chemotherapy, essentially they were      waiting for her wound to get better before initiating any chemo.      Discussions have been ongoing between the patient, Dr. Malvin Johns,      and Dr. Mariel Sleet.  The patient has followup appointment with Dr.      Mariel Sleet in December.      Osvaldo Shipper, MD  Electronically Signed     GK/MEDQ  D:  12/17/2005  T:  12/17/2005  Job:  [id number]   cc:   Barbaraann Barthel, M.D.  Fax: [phone]   Lionel December, M.D.  [address]  [place]  [place] [address]   Ladona Horns. Mariel Sleet, MD  Fax: [phone]

## 2010-06-06 NOTE — H&P (Signed)
Heather [name], Heather [name]               ACCOUNT NO.:  [id number]   MEDICAL RECORD NO.:  [id number]          PATIENT TYPE:  INP   LOCATION:  A340                          FACILITY:  [facility]   PHYSICIAN:  Skeet Latch, DO    DATE OF BIRTH:  07/02/1940   DATE OF ADMISSION:  03/11/2006  DATE OF DISCHARGE:  LH                              HISTORY & PHYSICAL   PRIMARY CARE PHYSICIAN:  Lionel December, M.D.   ONCOLOGIST:  Ladona Horns. Mariel Sleet, M.D.   GENERAL SURGEON:  Barbaraann Barthel, M.D.   CHIEF COMPLAINT:  Generalized weakness with failure to thrive.   HISTORY OF PRESENT ILLNESS:  This is a 70 year old Caucasian female who  presents complaining of generalized weakness and unable to eat solids  times several days.  The patient is also complaining of some nausea,  vomiting and generalized malaise times a few days.  The patient has a  history of colon cancer and has been receiving chemotherapy that started  back in October of 2007.  Back in October of 2007, the patient was  diagnosed with stage III colon cancer and underwent a colectomy and  colostomy placement at that time.  She also developed an acute pulmonary  embolism during that admission and was started on Coumadin therapy,  which she continues to take today.  The patient tonight presents  complaining of generalized malaise and a decreased appetite and states  that she fell two times this evening due to the weakness.  The patient  denies any chest pain, abdominal pain or urinary complaints at this  time.  The patient denies any problems with her colostomy currently.  On  exam, the patient is awake and alert, just states that she is very  tired, and was told to come to the emergency room for evaluation.   PAST MEDICAL HISTORY:  1. Stage III colon cancer.  2. Insulin-dependent diabetes.  3. Hyperlipidemia.  4. Hypertension.  5. Hypothyroidism.   ALLERGIES:  1. MOTRIN.  2. SULFA.  3. IODINE.  4. QUINIDINE.   CURRENT  MEDICATIONS:  1. Metoprolol 50 mg twice a day.  2. Zetia 10 mg once a day.  3. Levothyroxine 112 mcg once a day.  4. Potassium chloride, unknown dose.  5. Vasotec 10 mg once a day.  6. Clonidine 0.1 mg twice a day.  7. Coumadin 2 mg once a day.  8. Unknown Lantus dose.   PAST SURGICAL HISTORY:  1. Hysterectomy.  2. Bilateral breast surgery.  3. Back surgeries.  4. Bladder tack x2.  5. Colon resection with colostomy.  6. Hernia surgery x3.  7. Cholecystectomy.  8. Knee surgeries.   SOCIAL HISTORY:  Lives with her husband and son.  She states that she  quit smoking and was a one-pack-per-day x30 years smoker.  Denies any  alcohol or illicit drug abuse.   FAMILY HISTORY:  Noncontributory.  Does state that there is a history of  emphysema and congestive heart failure.   REVIEW OF SYSTEMS:  GENERAL:  She complains of weakness and fatigue.  RESPIRATORY:  Unremarkable.  GI:  The patient does  have a colostomy.  Denies any abdominal pain.  CARDIOVASCULAR:  Unremarkable.  MUSCULOSKELETAL:  Unremarkable.  NEUROLOGIC:  Unremarkable.  GENITOURINARY:  Unremarkable.  All other systems are unremarkable.   PHYSICAL EXAMINATION:  VITAL SIGNS:  Temperature is 96.5, blood pressure  is 92/65, pulse 80, respirations 20.  The patient is satting 100%.  GENERAL:  The patient is very pleasant and cooperative, appears her  stated age.  Alert and oriented.  HEENT:  Head is atraumatic and normocephalic.  Oral mucosa dry.  NECK:  Supple.  No thyromegaly or JVD noted.  CARDIOVASCULAR:  Regular rate and rhythm.  Positive S1 and S2 are  normal.  No murmurs appreciated.  LUNGS:  Clear to auscultation bilaterally.  No rales, rhonchi or  wheezing.  ABDOMEN:  Soft, nontender, nondistended.  Does have a colostomy bag in  the right lower quadrant with stool present.  EXTREMITIES:  No clubbing, cyanosis, or edema noted.   LABORATORY DATA:  White blood cell count was 7.6, hemoglobin was 14.4,  hematocrit  43.6, platelet count 372.  Sodium 135, potassium 3.0,  chloride 104, CO2 of 24, glucose 172, BUN 24, creatinine 2.32.  Alkaline  phosphatase 133, AST 39, ALT 20.  Total protein 6.7, calcium 9.1.  There  are no imaging studies at this time.   ASSESSMENT AND PLAN:  1. For generalized weakness and fatigue, the patient will be placed on      IV hydration with 0.9 of normal saline, 125 cc an hour.  I want to      add 20 mEq of KCl to the IV bag.  I believe this generalized      weakness is secondary to her chemotherapy.  The patient      subsequently is due for chemotherapy treatment in the a.m.  Also      will consult Dr. Mariel Sleet for recommendations regarding her      chemotherapy.  Also will get dietary consult regarding dietary      needs.  2. For colon cancer, as stated, Dr. Mariel Sleet will be consulted      regarding further recommendations regarding her colon cancer.  3. For her diabetes, the patient will be maintained on her Lantus, and      also will get a hemoglobin A1C and Accu-Cheks q.a.c. and q.h.s.  4. For her hypertension, it seems to be controlled at this time.  Will      likely put patient on home medications and hold if systolic blood      pressure is less than 100.  5. For hypothyroidism, continue her on her levothyroxine.  6. For hypokalemia, will add 20 mEq to the IV back and repeat in the      a.m.   Any further treatment will be based on lab results and the  recommendations from the consultants.  Will consult Dr. Malvin Johns  regarding the patient's colostomy and colon cancer also.      Skeet Latch, DO  Electronically Signed     SM/MEDQ  D:  03/11/2006  T:  03/11/2006  Job:  [id number]   cc:   Lionel December, M.D.  [address]  [place]  [place] [address]   Ladona Horns. Mariel Sleet, MD  Fax: [phone]   Barbaraann Barthel, M.D.  Fax: [phone]

## 2010-06-06 NOTE — Discharge Summary (Signed)
[name], [name]               ACCOUNT NO.:  [id number]   MEDICAL RECORD NO.:  [id number]          PATIENT TYPE:  INP   LOCATION:  1531                         FACILITY:  [facility]   PHYSICIAN:  Sharlet Salina T. Hoxworth, M.D.DATE OF BIRTH:  07/04/1940   DATE OF ADMISSION:  10/24/2007  DATE OF DISCHARGE:  11/11/2007                               DISCHARGE SUMMARY   DISCHARGE DIAGNOSES:  1. 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  2. Staph sepsis probably infected Port-A-Cath.  3. Recurrent urinary tract infection.  4. Deconditioning.   OPERATIONS AND PROCEDURES:  Removal of a Port-A-Cath by Dr. Dwain Sarna  November 02, 2007.   HISTORY OF PRESENT ILLNESS:  This is a readmission for Heather [name] who  has a complex surgical history.  She was discharged approximately one  week prior to this admission on home T&A and wound care for a  postoperative cold cutaneous fistula status post colostomy takedown and  ventral hernia repair.  She was then readmitted, this hospitalization  with 24-48 hours of decreased mental status, nausea, vomiting,  generalized abdominal pain and fever.  She has had just minimal fecal  and drainage from her wound undergoing dressing changes at home.  Her  abdominal pain has not been severe, generalized in the mid abdomen and  lower abdomen.  She has not had any specific urinary symptoms.   PAST MEDICAL HISTORY:  Please see recent detailed H&P from  hospitalization discharge one week ago.   HOSPITAL COURSE:  The patient was admitted.  A CT scan of the abdomen  and pelvis was obtained which showed no acute process.  The patient was  started on IV Zosyn empirically.  Blood cultures were obtained which  came back growing staph aureus.  Vancomycin was added.  A urinalysis  revealed too numerous to count white blood cells as well.  The patient  clinically quickly improved on IV antibiotics.  T&A was restarted via a  PICC-line.  We continued with wound  care.  Echocardiogram was obtained  which showed no vegetations.  Her port was felt likely to be the source.  She remained afebrile and clinically much improved on IV antibiotics.  Her urine culture did return growing E-coli as well.  She was seen by  the hospital service as well.  Her antibiotic coverage was narrowed to  Rocephin and vancomycin.  She was seen in consultation as well by Dr.  Earlene Plater.  Antibiotics were switched to Macrobid p.o. as well as the  vancomycin, and follow-up was arranged in his office as an outpatient  for recurrent UTIs.  Due to a likely source being her Port-A-Cath with  staph sepsis, she underwent removal of her Port-A-Cath by Dr. Dwain Sarna.  She had no real significant drainage from her fistula site during  hospitalization.  This appeared to heal completely.  White count  returned to normal.  She had no further fever.  She was continued on  T&A, but begun on a clear liquid diet and gradually advanced to a  regular diet which she tolerated well.  And she had no further fecal  drainage.  Her strength, mobility improved.  She progressed well with  physical therapy.  She subsequently was discharged on November 11, 2007.  Discharge medications were unchanged from admission.  See previous  dictation with the only change being the addition of Macrobid b.i.d.  Follow-up was arranged in my office in 2 weeks.      Heather [name]. Hoxworth, M.D.  Electronically Signed     BTH/MEDQ  D:  12/21/2007  T:  12/21/2007  Job:  [id number]   cc:   Ladona Horns. Mariel Sleet, MD  Fax: [phone]

## 2010-06-06 NOTE — Discharge Summary (Signed)
Heather [name], Heather [name]               ACCOUNT NO.:  [id number]   MEDICAL RECORD NO.:  [id number]          PATIENT TYPE:  INP   LOCATION:  A202                          FACILITY:  [facility]   PHYSICIAN:  Osvaldo Shipper, MD     DATE OF BIRTH:  31-Jul-1940   DATE OF ADMISSION:  10/18/2005  DATE OF DISCHARGE:  10/11/2007LH                                 DISCHARGE SUMMARY   PRIMARY CARE PHYSICIAN:  Dr. Lionel December.   Please review H&P dictated by Dr. Malvin Johns as well as consultations by Dr.  Mariel Sleet, Dr. Dietrich Pates, Dr. Karilyn Cota, and Dr.  Sherle Poe for details  regarding patient's presenting illness.   DISCHARGE DIAGNOSES:  1. Stage III colon cancer, newly diagnosed.  2. Intestinal obstruction status post colectomy with a colostomy      placement.  3. Acute pulmonary embolism during this hospital stay.  4. A history of coronary artery disease, stable.  5. A history of hypertension, stable.  6. A history of diabetes, stable.  7. A history of hypothyroidism, stable.   BRIEF HOSPITAL COURSE:  Briefly, this is a 70 year old white female who  presented to the ED with a 2-day history of nausea, vomiting, abdominal  pain.  She underwent an acute abdominal series which suggested a few air  fluid levels.  Could not exclude low-grade obstruction.  CT of the abdomen  and pelvis was recommended.  CT showed marked dilatation of the large bowel  loops with pneumatosis; some findings suggesting colitis versus malignancy  were found.  Bowel obstruction was also in the differential based on this  CAT scan.  The patient subsequently underwent a barium enema and this showed  a partially obstructing mass within the proximal descending colon.  Based on  this finding, the patient was taken to the OR by Dr. Malvin Johns and she  underwent a colectomy which showed a colonic mass and she had a colostomy  placed.  The patient spent a few days in the Intensive Care Unit and then  was transferred to 2A.  The patient  continued to do well.  She was mobilized  on 2A and she was able to ambulate with the help of physical therapy.  She  was being provided DVT prophylaxis with the help of sequential compressive  devices.  She was also getting incentive spirometry.  Her other medical  issues including hypertension, diabetes, hypothyroidism also remained stable  during this admission.  She also became anemic postop for which she required  2 units of packed red blood cells because of her history of coronary artery  disease.  Her LFTs were all normal.  Her HbA1c was 8.2.  Her thyroid  function tests were normal.   About day 7 of her inpatient stay, the patient became short of breath and  hypoxic.  Because of her hospital stay, the patient underwent a CAT scan of  the chest which showed acute pulmonary embolism.  The patient was  subsequently started on Coumadin along with Lovenox.  We were awaiting  therapeutic INR which we did achieve yesterday.  However, the patient  started complaining of  severe right-sided pleuritic chest pain.  This  prompted a delay in her discharge.  We obtained an x-ray of her lungs which  showed possible new infiltrate on the right side.  It was also possible that  her PE is causing this pleuritic pain.  Because of her history of coronary  artery disease, we obtained an EKG which did not show any changes from  previous EKG that was of significance.  Cardiac enzymes were also done which  were negative.  The patient was given some pain medication with which she  had improvement in her symptoms.   This morning, the patient was feeling quite well.  She had ambulated with  the help of Physical Therapy who had recommended a cane.  The patient was  keen on going home.  The patient's pain had improved.  Her blood sugars were  all well controlled.  Considering the above, she was considered stable for  discharge.   DISCHARGE MEDICATIONS:  1. Albuterol nebulizers 3 times daily 2.5 mg.  2.  Clonidine 0.3 mg patch q. weekly.  3. Vasotec 10 mg p.o. daily.  4. Lasix 20 mg daily.  5. Lantus 20 units subcu. nightly.  6. Levaquin 750 mg p.o. daily for 6 days.  7. Synthroid 125 mg p.o. daily.  8. Metoprolol 100 mg p.o. b.i.d.  9. Multivitamin 1 tablet daily.  10.Protonix 40 mg daily.  11.Potassium chloride 40 mEq daily.  12.Coumadin 3 mg q.p.m.   FOLLOWUP:  1. A PT/INR to be checked on Monday and results to be forwarded to Dr.      Karilyn Cota for adjustment of her Coumadin dose.  This has been discussed      with Dr. Karilyn Cota.  2. Follow up with Dr. Malvin Johns as per Dr. Malvin Johns.  3. Dr. Mariel Sleet October 26.  4. With her PMD as needed.   PROCEDURES UNDERGONE DURING THIS ADMISSION:  Colectomy with end-transverse  colostomy and incidental appendectomy.  Please see Dr. Daisy Blossom dictated  note for details.   OTHER IMAGING STUDIES DONE DURING THIS ADMISSION:  Ultrasound of the lower  extremities which ruled out DVT.  Apart from this, the other studies have  been discussed earlier.   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   CONSULTATIONS:  1. Obtained from Dr. Mariel Sleet who recommended patient see him in his      office to discuss treatment for her cancer.  2. Obtained also from Dr. Karilyn Cota.  3. From Dr. Malvin Johns who did the surgery.   Hospitalists from our group, that is, was following the patient for her  medical problems.   DIET:  She was asked to eat a modified carbohydrate diet.   PHYSICAL ACTIVITY:  She was asked to use a cane to increase her activity  slowly.  Home health PT was arranged for the patient.   The patient will also need home O2 which was also arranged.   She was also asked to check her blood sugars at home and to call Dr.  Patty Sermons office with numbers.   Total time of discharge about 45 minutes.      Osvaldo Shipper, MD Electronically Signed     GK/MEDQ  D:  10/29/2005   T:  10/29/2005  Job:  [id number]   cc:   Barbaraann Barthel, M.D.  Fax: [phone]   Lionel December, M.D.  [address]  [place]  [place] [address]   Ladona Horns. Mariel Sleet, MD  Fax: [phone]

## 2010-06-06 NOTE — Op Note (Signed)
Heather [name], Heather [name]               ACCOUNT NO.:  [id number]   MEDICAL RECORD NO.:  [id number]          PATIENT TYPE:  AMB   LOCATION:  DAY                           FACILITY:  [facility]   PHYSICIAN:  Barbaraann Barthel, M.D. DATE OF BIRTH:  1940/06/28   DATE OF PROCEDURE:  01/20/2006  DATE OF DISCHARGE:                               OPERATIVE REPORT   SURGEON:  Dr. Barbaraann Barthel.   PREOPERATIVE DIAGNOSIS:  Adenocarcinoma of the colon status post  colectomy.   PROCEDURE:  Placement of right subclavian Port-A-Cath under fluoroscopy.   SPECIMEN:  None.   INDICATIONS:  This is a 70 year old white female who is status post  colon resection for obstructing colon cancer.  She was followed up by  the oncologist and plans were made for electively placing a Port-A-Cath  for adjuvant chemotherapy.  We discussed complications with this patient  in detail discussing pneumothorax, bleeding, infection and catheter  embolization with this patient in detail.  Informed consent was  obtained.   GROSS OPERATIVE FINDINGS:  This was placed under fluoroscopy without any  problem.  The catheter position was confirmed by fluoroscopy to be in  the superior vena cava.  A postoperative portable chest x-ray showed no  pneumothorax and the position of the catheter in good  status in the  superior vena cava.   TECHNIQUE:  The patient was placed in supine position after the adequate  administration of sedation her right hemithorax was prepped with a non  containing iodine solution.  She was draped in the usual manner and then  using 1% Xylocaine without epinephrine the area was anesthetized below  her right clavicle and with the patient in Trendelenburg position the  subclavian vein was cannulated with an 18-gauge needle and then the  guidewire was placed through this under fluoroscopy.  We then used the  venous dilator and placed the Silastic catheter over the guidewire and  into the proper position using the  Seldinger technique.  The guidewire  was then removed.  We had one or two ectopic beats and that was it.  There were no dysrhythmias.  We then anesthetized the subcutaneous  pocket using 1% Xylocaine without epinephrine after flushing the of the  Port-A-Cath device and then connecting it and then aspirating for any  air bubbles trapped within the apparatus, we then flushed this once  connected with the heparinized solution and then buried this in the  subcutaneous pocket.  The wound was irrigated and the subcutaneous layer  was closed with 4-0 Polysorb.  The skin was approximated with a  subcuticular 5-0 Polysorb suture with Steri-Strips, Neosporin ointment,  2 x 2 and an OpSite dressing placed.  Prior to closure all sponge,  needle and instrument counts were found to be correct.  Estimated blood  loss was minimal.  The patient received 700 mL crystalloids  intraoperatively.  No drains were placed.  There were no complications.   This patient has been instructed to contact Dr. Dionicia Abler, otherwise is  alright with me for her to resume her Coumadin this evening with her 2  mg dose  as regular.  She will be followed as well by Dr. Mariel Sleet.      Barbaraann Barthel, M.D.  Electronically Signed     WB/MEDQ  D:  01/20/2006  T:  01/20/2006  Job:  [id number]   cc:   Lionel December, M.D.  [address]  [place]  [place] [address]

## 2010-06-06 NOTE — Consult Note (Signed)
NAMEARIAUNA, FARABEE               ACCOUNT NO.:  [id number]   MEDICAL RECORD NO.:  [id number]          PATIENT TYPE:  INP   LOCATION:  A318                          FACILITY:  [facility]   PHYSICIAN:  Kassie Mends, M.D.      DATE OF BIRTH:  [birth year]   DATE OF CONSULTATION:  12/20/2005  DATE OF DISCHARGE:                                 CONSULTATION   REASON FOR CONSULTATION:  Vomiting and abdominal pain.   HISTORY OF PRESENT ILLNESS:  Ms. Los is a 70 year old female who has  a significant history of nausea and vomiting in September 2007, when she  was diagnosed with an obstructing colon cancer.  After surgery her  symptoms absolutely resolved.  She had no prior history of problems with  nausea and vomiting before 2007.  She woke up Thursday with the onset of  abdominal pain and vomiting. She thought it was related to changing her  insulin medicine.  She reports her sugars were up into the 400s.  She  denied any fevers, sick contact, diarrhea, blood in her stool or blood  in her vomit.  She denied any heartburn or any indigestion.  She thought  it may have been her reflux disease, and took a pill for indigestion;  but, she vomited that back up.  She received no relief with her antacid.  She could not keep anything down, so she came to the emergency  department.   She felt better on Friday, but today she was still having problems with  nausea.  Phenergan caused her to have slurred speech and to be somewhat  confused and forget what she had told her daughter.  She denies taking  any aspirin, BC's or Goody's.  She has been on Coumadin for the PE that  was diagnosed in October.   PAST MEDICAL HISTORY:  1. Diabetes.  2. Hypertension.  3. Hyperlipidemia.  4. Hypothyroidism.  5. Pulmonary embolus.   PAST SURGICAL HISTORY:  1. Cholecystectomy in 1980's due to gallstones.  2. Ventral hernia repair.  3. Back surgery.  4. Bladder surgery.  5. Cyst removal from breast.  6. Knee  surgery.   ALLERGIES:  IODINE, MOTRIN, QUININE, SULFA.   FAMILY HISTORY:  She has no history of colon cancer.   MEDICATIONS:  1. Vasotec.  2. __________  3. Novolog sliding scale.  4. Lantus.  5. Synthroid.  6. Levothyroid.  7. Lopressor.  8. Protonix q. 24 h.  9. Zosyn q. 6 h.  10.Coumadin.  11.Dilaudid 1 mg IV q. 4 h as needed, one dose in the last 24 hr.  12.Phenergan 12.5 mg IV q. 4 h as needed, 2 doses in the last 24 hr.   FAMILY HISTORY:  She has no family history of colon cancer.   SOCIAL HISTORY:  She lives with her husband and her son.  She denies any  alcohol.  She ambulates with a cane.   REVIEW OF SYSTEMS:  As per HPI.  She denies any abdominal pain  currently.  Her wound is still open because of infection, and she has  packing twice  a day.  Otherwise all other systems are negative.   PHYSICAL EXAMINATION:  VITAL SIGNS:  Afebrile, hemodynamically stable.  GENERAL:  She is in no apparent distress;  alert and oriented x4.  HEENT:  Normocephalic and atraumatic.  Pupils equal and react to light.  Mouth no oral lesions.  Throat examination without erythema or exudate.  NECK:  Has full range of motion.  No lymphadenopathy.  LUNGS:  Clear to auscultation bilaterally.  CARDIOVASCULAR:  Regular rhythm.  No murmur.  Normal S1 and S2.  ABDOMEN:  Bowel sounds are present, soft, mildly distended; no rebound  or guarding.  Midline incision with dressing over it, which is dry and  clean.  She has an ostomy in the right side.  EXTREMITIES:  Without cyanosis, clubbing or edema.  NEUROLOGIC:  She has no focal neurologic deficits.   LABORATORY STUDIES:  INR 4.9, potassium 4.3, creatinine 0.9, calcium  9.0.  Hemoglobin 14.6, white count 17.4.   ASSESSMENT:  Ms. Clippard is a 70 year old female with acute onset of  abdominal pain, followed by nausea and vomiting; which is likely viral  in etiology.  The differential diagnosis includes reflux esophagitis and  H. pylori gastritis,  irritable abdominal pain has resolved.  Her nausea  and her vomiting persist.  She has recently been anticoagulated. Thank  you for allowing me to see Ms. Kienast in consultation.  My  recommendations follow.   RECOMMENDATIONS:  1. Continue clear liquid diet.  2. Begin around-the-clock Zofran.  Phenergan causes sedation and      mental status changes.  3. She has no acute indication for an EGD.  4. Continue b.i.d. PPI.  5. I will reassess her in the morning.      Kassie Mends, M.D.  Electronically Signed     SM/MEDQ  D:  12/20/2005  T:  12/20/2005  Job:  [id number]

## 2010-06-06 NOTE — Discharge Summary (Signed)
Heather [name], Heather [name]               ACCOUNT NO.:  [id number]   MEDICAL RECORD NO.:  [id number]          PATIENT TYPE:  INP   LOCATION:  A318                          FACILITY:  [facility]   PHYSICIAN:  Osvaldo Shipper, MD     DATE OF BIRTH:  [birth year]   DATE OF ADMISSION:  12/17/2005  DATE OF DISCHARGE:  12/03/2007LH                               DISCHARGE SUMMARY   DISCHARGE DIAGNOSES:  1. Likely acute gastroenteritis, resolved.  2. History of recent pulmonary embolism, on anticoagulation.  3. History of stage III colon cancer status post colectomy and a      colostomy placement.  4. Hypothyroidism.  5. Diabetes, on insulin.  6. Hypertension.   BRIEF HOSPITAL COURSE:  Briefly, this is a 70 year old Caucasian female  who is well known to our service for her prolonged hospital stay the  last time she was here.  Patient was diagnosed with stage III colon  cancer after she was admitted for intestinal obstruction and underwent a  colectomy and colostomy placement by Dr. Malvin Johns.  The patient also  developed acute PE during this admission and had to be put on  anticoagulation.   Patient presented to the hospital at this time complaining of nausea,  vomiting and some upper abdominal discomfort.  The abdominal discomfort  resolved but her nausea and vomiting persisted.  Hence, patient was  admitted to the hospital.  She underwent a CAT scan of her abdomen and  pelvis which revealed findings of the surgery and some hypodensity of  the adrenal glands of unclear significance.  Patient had an elevated  white count; hence, she was empirically put on Zosyn before the CAT scan  was done.  Patient, however, showed improvement over the course of the  next few days.  GI also saw her and made some recommendations regarding  symptomatic treatment.  She was slowly advanced on her diet and has  tolerated solid foods the last couple of meals.  Hence, she is  considered stable for discharge from this  respect.   Acute PE was diagnosed during the last admission as mentioned above.  Coumadin was continued; however, INR went up possibly because of  interaction with Zosyn.  Coumadin was held and she was given  instructions to hold it for two more days and have a PT, INR check on  Wednesday.   She was also hypokalemic, which was also corrected.  She did have a low  TSH level; however, unfortunately, a free T4 was not done.  Would  request the PMD to follow up on this.   She was feeling quite well on the day of discharge, had ambulated with  no difficulties and considering her improvement she was considered  stable for discharge.  The patient has good social support in the form  of her children.  Hence, she will be safe at home.   Her diabetic regimen was adjusted to achieve better control.   DISCHARGE MEDICATIONS:  The only thing, really, no changes were made to  any of her medications.  See H&P for a complete list. She was asked  to  continue all of them as before except for the Coumadin which she was  asked to hold for today and tomorrow and depending on the PT, INR on  Wednesday Dr. Karilyn Cota will call in instructions.  Also, her Lantus was  increased to 30 units subcu q.h.s.   FOLLOW UP:  1. With Dr. Karilyn Cota in the next 1 week.  2. A PT, INR to be checked on Wednesday.   DIET:  Modified carbohydrate diet medium.   PHYSICAL ACTIVITY:  No restrictions.  She may return to her previous  level of activity.  She uses a cane and occasionally a walker at home.   TOTAL TIME SPENT AT DISCHARGE:  About 45 minutes.   Please note patient also had a wound in her incision site, which has  been followed by Dr. Malvin Johns, for which she gets dressing changes twice  a day at home but should also be continued.  Patient was asked to follow  up with Dr. Malvin Johns for the same.  She was also asked to follow up with  Dr. Mariel Sleet for consideration of chemotherapy for her colon cancer.      Osvaldo Shipper, MD  Electronically Signed     GK/MEDQ  D:  12/21/2005  T:  12/21/2005  Job:  [id number]   cc:   Lionel December, M.D.  [address]  [place]  [place] [id number]

## 2010-06-06 NOTE — Consult Note (Signed)
[name], [name]               ACCOUNT NO.:  [id number]   MEDICAL RECORD NO.:  [id number]          PATIENT TYPE:  INP   LOCATION:  A340                          FACILITY:  [facility]   PHYSICIAN:  Barbaraann Barthel, M.D. DATE OF BIRTH:  [birth year]   DATE OF CONSULTATION:  03/11/2006  DATE OF DISCHARGE:                                 CONSULTATION   NOTE:  Surgery was asked to see this 70 year old white female who is  status post subtotal colectomy for an obstructing colon cancer.  The  surgery was done in October 2007.  She has been treated in the interim  with chemotherapy which she has not tolerated well and now is admitted  for basically failure to thrive and watery diarrhea of which I think are  pretty much related to her reaction to the chemotherapy.  I was asked to  evaluate her wounds and stoma.  In essence, her wound which we had a  great deal of trouble with, is now completely healed. She does have an  ulcer around her stoma, a peristomal ulcer, which is aggravated because  of the watery diarrhea that she has had.   I have discussed stomal appliances and the generous use of stomal  powder, etc., with the stoma specialist and she is constructing this as  needed.  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 This still works very well and admits a finger and this will  require finger dilatation so that this does not get any smaller.  Ideally, this could be revised, however, given her diagnosis, we may  want to leave well enough alone since it is actually working quite well  for her fecal stream and this is more of a right sided fecal stream that  is more loose in general.  At any rate, I will follow with you.      Barbaraann Barthel, M.D.  Electronically Signed     WB/MEDQ  D:  03/11/2006  T:  03/11/2006  Job:  [id number]

## 2010-06-06 NOTE — Consult Note (Signed)
NAMEGENEVER, HENTGES               ACCOUNT NO.:  [id number]   MEDICAL RECORD NO.:  [id number]          PATIENT TYPE:  INP   LOCATION:  IC03                          FACILITY:  [facility]   PHYSICIAN:  Gerrit Friends. Dietrich Pates, MD, FACCDATE OF BIRTH:  05-Apr-1940   DATE OF CONSULTATION:  10/21/2005  DATE OF DISCHARGE:                                   CONSULTATION   PRIMARY CARE PHYSICIAN:  __________   PRIMARY CARDIOLOGIST:  Formerly Valera Castle, MD   HISTORY OF PRESENT ILLNESS:  70 year old woman with multiple cardiovascular  risk factors and prior silent myocardial infarction referred for assistance  with management following resection of carcinoma of the colon.  Ms.  Sison cardiac history dates to 2002 when she underwent a stress nuclear  study that verified inferolateral infarction with mild to moderate  impairment of overall LV systolic function.  She was asymptomatic at that  time.  Treatment with a beta blocker and ACE inhibitor was initiated and  attention was turned to optimal management of vascular risk factors.  A  statin was added for management of dyslipidemia.  Adequately controlled  diabetes was verified.  She has subsequently done well with no  cardiopulmonary symptoms.  She was admitted with bowel obstruction and  underwent emergency partial colectomy including resection of an obstructing  carcinoma.  There were no apparent perioperative complications.   ALLERGIES:  The patient reports allergies or adverse reactions to SULFA,  IODINE AND IBUPROFEN.   MEDICATIONS PRIOR TO ADMISSION:  Included  1. Aspirin 81 mg q.d.  2. Metoprolol 50 mg b.i.d.  3. Levothyroxine 0.125 mg q.d.  4. Ezetimibe 10 mg q.d.  5. KCl 550 mg q.d.  6. Insulin 70/30 18 units q.a.m. and p.m.  7. Enalapril 10 mg q.d.  8. Furosemide 20 mg q.d.   Past medical history is otherwise notable for hypertension and  hypothyroidism.  Diabetic control had been good with hemoglobin A1c levels  consistently  below 7 but a more recent value in July was 9.7.  The patient  has undergone bladder suspension and right knee arthroscopic surgery.  She  has had a partial hysterectomy.  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  She is  followed by Dr. Tawanna Cooler Early for this problem.  She underwent cholecystectomy  and the early 1980s but subsequently required ERCP for retained common bile  duct stone.  She has had bilateral salpingo-oophorectomy and required repair  of incisional hernia on two occasions.   SOCIAL HISTORY:  Married and lives locally; tobacco consumption of one pack  per day; no excessive alcohol; retired.   FAMILY HISTORY:  No prominent coronary disease.   REVIEW OF SYSTEMS:  Notable for recent recurrent emesis, the need for  corrective lenses for near vision, postmenopausal state and chronic  arthritic pain of the knees.  All other systems reviewed and are negative.   PHYSICAL EXAMINATION:  GENERAL:  Pleasant woman who is alert postoperatively  and in no acute distress.  VITAL SIGNS:  The temperature is 98.1, blood pressure 150/60, heart  rate 90,  respirations 22, O2 saturation 92% on 2 liters.  HEENT:  Anicteric sclerae; normal lids and conjunctivae.  NECK:  No jugular venous distension; normal carotid upstrokes without  bruits.  LUNGS:  Inspiratory, expiratory rhonchi and wheezes without respiratory  distress.  CARDIAC:  Normal first and second heart sounds; modest systolic murmur.  ABDOMEN:  Soft; no bowel sounds; no organomegaly.  EXTREMITIES:  Adequate distal pulses; no edema.   EKG:  Normal sinus rhythm; prominent voltage; probable prior inferior  myocardial infarction; ST - T-wave abnormalities consistent with apical  ischemia or LVH.  CHEST X-RAY:  Mild left basilar atelectasis.   Other laboratory notable for a normal CBC, minimal hyponatremia, diabetes  out of control with glucose 420, normal renal function  and normal amylase.   IMPRESSION:  Ms. Watson is doing well following urgent surgery.  She has  been asymptomatic with respect to heart disease despite moderately impaired  left ventricular systolic function.  Continuing beta blockers intravenously  perioperatively is appropriate.  Additional antihypertensive medications can  be added as necessary.  Aspirin should be restarted as soon as feasible.  We  will be happy to assist with her postoperative management and appreciate the  request for consultation.      Gerrit Friends. Dietrich Pates, MD, [facility]  Electronically Signed     RMR/MEDQ  D:  10/21/2005  T:  10/22/2005  Job:  [id number]

## 2010-06-06 NOTE — Group Therapy Note (Signed)
Heather [name], Heather [name]               ACCOUNT NO.:  [id number]   MEDICAL RECORD NO.:  [id number]          PATIENT TYPE:  INP   LOCATION:  A202                          FACILITY:  [facility]   PHYSICIAN:  Margaretmary Dys, M.D.DATE OF BIRTH:  1940-03-16   DATE OF PROCEDURE:  10/25/2005  DATE OF DISCHARGE:                                   PROGRESS NOTE   SUBJECTIVE:  The patient was found to have bilateral pulmonary embolus from  a CT scan.  She did have some trouble with shortness of breath and taking a  deep breath.  She denies any chest pain or dizziness.  NO light-headedness.  She has very minimal pain from a prior abdominal surgery.   OBJECTIVE:  Conscious, alert, comfortable.  Not in acute distress.  VITAL SIGNS:  Blood pressure was 142/76, pulse of 81, respirations 19,  temperature 99 degrees Fahrenheit.  Blood sugars have ranged between 162-  195.  Oxygen saturation is 96% on 3L.  The patient, however, desaturates to  79% on room air.  HEENT:  Normocephalic, atraumatic.  Oral mucosa was moist with no exudates.  NECK:  Supple.  No JVD.  No lymphadenopathy.  LUNGS:  Reduced air entry bilaterally with occasional crackles at the bases.  HEART:  S1, S2 regular.  No S3, S4, gallops or rubs.  ABDOMEN:  Soft, nontender.  Bowel sounds are positive.  No masses palpable.  Colostomy bag doing very well.  Staples appear intact with good wound  healing.  EXTREMITIES:  No edema.  No calf induration.  No clinical evidence to  suggest deep vein thrombosis.   LABORATORY/DIAGNOSTIC DATA:  CT scan of the chest revealed bilateral  pulmonary embolus.  Results were only called in this morning at 5 a.m.   White blood cell count was 10.7, hemoglobin of 9.0, hematocrit 27, platelet  count was 317,000 with no left shift.  Sodium 133, potassium 3.3, chloride  of 99, CO2 28.  Glucose 161, BUN 1.1,   ASSESSMENT AND PLAN:  1. Bilateral pulmonary embolus with hypoxic respiratory failure.  The      patient  has had recent surgery likely putting her at the risk for      developing it.  Plan is to initiate Lovenox 1 mg subq q.12.  Will start      her on Coumadin tomorrow.  I have discussed this with the surgeon, Dr.      Malvin Johns, and he is agreeable to starting anticoagulation.  I also      mentioned this to the patient that she will need to be anticoagulated      for at least  9 months.  2. Hypokalemia.  Will replace her potassium.  3. Acute hypertension.  Blood pressure is better controlled.  Continue      current anti-hypertensives.  4. Diabetes Mellitus fair control.  Continue on sliding scale and Lantus      insulin.   DISPOSITION:  Will await full anticoagulation with therapeutic INR prior to  discharge.  The patient is at significant risk for bleeding due to her  recent surgery, but  benefit of anticoagulation outweighs the risks at this  time.  I discussed this both with the patient and the daughter, and they  verbalized full understanding.      Margaretmary Dys, M.D.  Electronically Signed     AM/MEDQ  D:  10/25/2005  T:  10/25/2005  Job:  [id number]

## 2010-06-06 NOTE — Consult Note (Signed)
Heather [name], Heather [name]               ACCOUNT NO.:  [id number]   MEDICAL RECORD NO.:  [id number]          PATIENT TYPE:  INP   LOCATION:  IC03                          FACILITY:  [facility]   PHYSICIAN:  Margaretmary Dys, M.D.DATE OF BIRTH:  04-14-1940   DATE OF CONSULTATION:  10/19/2005  DATE OF DISCHARGE:                                   CONSULTATION   PRIMARY CARE PHYSICIAN:  Barbaraann Barthel MD   REASON FOR CONSULTATION:  Inpatient management of multiple medical problems  including hypertension, diabetes, dehydration, and hypothyroidism.   HISTORY OF PRESENT ILLNESS:  Heather [name] is a 70 year old, Caucasian female  with multiple medical problems.  The patient presented to the emergency room  yesterday with a 2-day history of nausea, vomiting, and abdominal pain.  The  patient said his symptoms may have started at about Tuesday which would be  about a week ago but progressively worsened.   The patient has had some crampiness with constipation.  Please kindly see  the surgeon's note for details.  Apparently, the patient had a CT scan today  which showed large bowel obstruction with a questionable mass in the colon.  The patient was taken to the OR today by Dr. Malvin Johns.  I have not seen the  operation notes but it appears that she had a tumor in the ascending colon  and there was no evidence of hepatic or metastasis.  The patient had a  colectomy and currently has a colostomy bag.   The surgeon has requested the hospital service to provide medical management  for her care while here in the Hospital.  The patient is currently  comfortable and is fairly drowsy from her analgesic narcotic anesthesia,  otherwise has no complaints.  She has no headaches.  No shortness of breath.  No chest pain.  No fevers or chills.   REVIEW OF SYSTEMS:  As mentioned in the history of present illness above.   PAST MEDICAL HISTORY:  1. Hypothyroidism.  2. Dyslipidemia.  3. Type 2 diabetes.  The  daughter states it is fairly well controlled.  4. Status post gallbladder surgery in 1980.  5. Hypertension.   MEDICATIONS:  She is on:  1. Metoprolol titrate 50 mg p.o. b.i.d.  2. Zetia 10 mg p.o. once a day.  3. Crestor 10 mg p.o. once a day.  4. Levothyroxine 112 mg p.o. once a day.  5. Vasotec 10 mg p.o. once a day.  6. Aspirin 81 mg once a day.  7. Insulin Novolin 70/30, 18 units subcutaneously b.i.d.  8. Potassium supplement once a day.   ALLERGIES:  The patient has allergy to SULFA and IODINE.  The patient also  has allergy to IBUPROFEN.   FAMILY HISTORY:  There is no family history of colon cancer and is  noncontributory.   SOCIAL HISTORY:  The patient is married, lives with her husband.  She is  independent with activities of daily living.  She smokes about a pack of  cigarettes a day.  Does not drink alcohol.  Is retired.  She is fairly  independent.   PHYSICAL EXAMINATION:  The patient was sedated but arousable.  Denies any  acute distress.  Currently, her blood pressure is 158/66.  Pulse of 95.  Respiration of 18.  T-max was 98.8 degrees Fahrenheit.  Oxygen saturation  was 98% on room air.  HEENT:  Normocephalic, atraumatic.  Oral mucosa was dry.  No exudates were  noted.  NECK:  Supple. No JVD or lymphadenopathy.  LUNGS:  Clear air entry bilaterally.  HEART:  S1, S2, regular rate, no gallops or rubs.  ABDOMEN:  Soft.  It was mildly tender.  The patient has a colostomy bag.  She had no bowel sounds.  EXTREMITIES:  Mild pretibial edema with no calf induration or tenderness.  Pulses were palpable.  NEUROLOGIC:  Sedated. No focal neurological deficits.   LABORATORY/DIAGNOSTIC DATA:  White blood cell count 7.7.  Hemoglobin of  11.9.  Hematocrit is 36.6.  MCV 73.7.  Platelet count was 421.  There was no  left shift.  Sodium 133; potassium 3.8; chloride of 104; CO2 23; glucose  317.  That has gone as high as 420 last night.  BUN 20, creatinine was 0.8.  Calcium is  7.9.  Urinalysis was unremarkable.   The patient had multiple imaging studies including a barium enema with  Gastrografin which showed ascending colonic partial obstruction with  possible pneumatosis and stools within the right colon.  She also had some  mild bibasilar atelectasis.   A CT scan of the abdomen and pelvis really did not show any evidence of free  air.   ASSESSMENT AND PLAN:  Heather [name] is a 70 year old, Caucasian female with  nausea, vomiting, and abdominal pain diagnosed with large bowel obstruction,  likely from a tumor.  The patient had a colectomy today with colostomy from  an exploratory laparotomy by Dr. Malvin Johns.  She has multiple medical  problems as outlined below and hospital service will be taking care of her  medical issues while here in the hospital.  1. Type 2 diabetes.  Blood sugars have been very poorly controlled.  It      could be because she is acutely ill.  In light of her recent surgery,      we will start her on insulin infusion and try to keep her blood sugars      between 80 to 110.  We will use the Glucommander to achieve this.  We will hold her Novolin 70/30 at this time .  1. Hypothyroidism.  We will resume intravenous Synthroid replacement.  2. Hypertension.  p.o  is not available.  The patient's blood pressure is      slightly elevated and think she would benefit from a beta blocker.  She      is currently on metoprolol 5 mg intravenous every 6 hours.  If her      blood pressure continues to remain an issue, we will start her on a      clonidine patch.  3. Dyslipidemia.  We will hold starting therapy at his time, as I do not      see any clear indication to continue this.  4. Status post exploratory laparotomy with colectomy and colostomy      placement by Dr. Malvin Johns.We will be managing the inpatient medical      problems as mentioned above.  We would like to thank Dr. Malvin Johns for this consult and we will follow with  him.       Margaretmary Dys, M.D.  Electronically Signed  AM/MEDQ  D:  10/19/2005  T:  10/19/2005  Job:  [id number]

## 2010-06-06 NOTE — Discharge Summary (Signed)
[name], [name]               ACCOUNT NO.:  [id number]   MEDICAL RECORD NO.:  [id number]          PATIENT TYPE:  INP   LOCATION:  1526                         FACILITY:  [facility]   PHYSICIAN:  Sharlet Salina T. Hoxworth, M.D.DATE OF BIRTH:  1940/10/27   DATE OF ADMISSION:  09/18/2007  DATE OF DISCHARGE:  10/17/2007                               DISCHARGE SUMMARY   DISCHARGE DIAGNOSIS:  Cutaneous fistula status post colostomy takedown.   OPERATIONS AND PROCEDURES:  Incision and drainage and packing of  abdominal wound on September 18, 2007 by Dr. Johna Sheriff.   HISTORY OF PRESENT ILLNESS:  Heather [name] is a 70 year old diabetic  patient with history of colectomy on the left side with transverse  colostomy and Hartmann pouch done in [place] 2-3 years ago.  She  completed chemotherapy and strongly desired colostomy takedown.  She  came to [facility] to have this done.  Her colostomy was taken down July  17 with anastomosis.  She did have some apparent walled off leak at the  time of that surgery with a few air bubbles around the anastomosis and  some abdominal tenderness, elevated white count.  This was managed  nonoperatively with antibiotics and seemed to all resolve prior to her  subsequent discharge.  The patient also had a large ventral hernia that  was repaired with Flex HD biological mesh at the time of her original  surgery.  The patient subsequently represented August 19 with abdominal  wound seroma, was admitted by Dr. Michaell Cowing, the seroma was aspirated, did  not grow organisms.  The patient improved, and she was discharged August  21 on p.o. Augmentin.  However, over the last couple days prior to this  admission.  The patient developed some increasing abdominal discomfort,  was seen in the [facility] emergency room last evening.  CT was obtained  showing a large collection of gas and fluid in her subcutaneous space at  the hernia sac area.  She was therefore transferred here for  further  care.   PAST MEDICAL HISTORY:  She is treated for hypertension, elevated  cholesterol, diabetes.   MEDICATIONS:  Insulin, enalapril 10 daily, metoprolol 50 b.i.d.,  clonidine 0.1 b.i.d., Zetia 10, Coumadin daily, Synthroid 100 mcg daily  and Lasix.   ALLERGIES:  IODINE, MOTRIN, QUININE SULFA.   PHYSICAL EXAMINATION:  VITAL SIGNS:  At time of admission, temperature  was 100, pulse 104, respirations 18, blood pressure 132/72.  ABDOMEN:  Revealed some soft swelling under the incision into the right  of the midline with moderate tenderness.  Again CT scan showed a large  gas and fluid collection anterior to the fascia in the wound.   HOSPITAL COURSE:  The patient was admitted, Dr. Zachery Dakins started on  broad-spectrum IV antibiotics.  It appeared clear that she had a leak or  fistula into her wound.  Her Coumadin was reversed with fresh frozen  plasma.  The patient was taken to the operating room on September 18, 2007,  where she underwent opening and packing of her abdominal wound.  The  wound itself actually was quite clean, although  there was obvious fecal  material in it.  There was clean granulation tissue once this was  irrigated suction.  There was a small pinhole fistula through the Flex  HD mesh which otherwise appeared well granulated, healthy with no  necrosis.  Postoperatively, the patient was extubated in the recovery  room, but had poor respiratory effort, was reintubated, transferred to  the ICU.  Critical care medicine assisted in her ventilator management,  and by the following morning, after the anesthetic had worn off, the  patient was strong with good respiratory effort and was extubated  without difficulty.  White count at this time was decreased to 13,000.  She was started on TNA and b.i.d. wound dressings.  She had essentially  small amount of fecal and drainage into her wound which was easily  controlled with gauze dressings.  She was very stable with a  normal  white count of 8.7 by postoperative day #3, and nontender abdomen.  This  appeared to be well-controlled fistula, and I elected to treat this with  TNA, bowel rest and wound care.  This regimen was essentially the  remainder of her hospitalization during which she gradually improved.  Her antibiotics were discontinued after a 1-week course when she had  been afebrile with a normal white count for several days.  Her wound  began to close and granulate nicely with continued very minimal feculent  drainage with just a few spots on the bandage with dressing changes.  The patient, otherwise, felt well.  She did develop some urinary  burning, frequency and was found to have a UTI which was treated with  appropriate antibiotics.  These symptoms resolved.  Her wound drainage  continued to decrease with some days having no drainage and other days  of minimal amount.  The patient, otherwise, felt well, and we elected to  let her go home on home TNA and wound care to allow this to continue the  heel.  She is discharged home on October 17, 2007.   DISCHARGE MEDICATIONS:  The same as on admission.   DISCHARGE INSTRUCTIONS:  Home health is to see her for daily gauze wound  packing.  At this time, her white count is 9000, hemoglobin 9.  She is  afebrile and abdomen is benign.  She is on TNA and n.p.o. except sips of  clear liquids.  Follow-up is to be in my office in 2 weeks.      [name], M.D.  Electronically Signed     BTH/MEDQ  D:  11/21/2007  T:  11/21/2007  Job:  [id number]

## 2010-07-01 ENCOUNTER — Encounter (HOSPITAL_COMMUNITY): Payer: Self-pay | Admitting: *Deleted

## 2010-07-03 ENCOUNTER — Other Ambulatory Visit (HOSPITAL_COMMUNITY): Payer: Self-pay | Admitting: Oncology

## 2010-07-03 DIAGNOSIS — C189 Malignant neoplasm of colon, unspecified: Secondary | ICD-10-CM | POA: Insufficient documentation

## 2010-07-28 ENCOUNTER — Encounter (HOSPITAL_COMMUNITY): Payer: Self-pay

## 2010-07-29 ENCOUNTER — Encounter (HOSPITAL_COMMUNITY): Payer: BC Managed Care – PPO | Attending: Oncology

## 2010-07-29 ENCOUNTER — Other Ambulatory Visit (HOSPITAL_COMMUNITY): Payer: Self-pay | Admitting: Oncology

## 2010-07-29 DIAGNOSIS — C189 Malignant neoplasm of colon, unspecified: Secondary | ICD-10-CM | POA: Insufficient documentation

## 2010-07-29 LAB — COMPREHENSIVE METABOLIC PANEL
CO2: 29 mEq/L (ref 19–32)
Calcium: 9.8 mg/dL (ref 8.4–10.5)
Creatinine, Ser: 1.5 mg/dL — ABNORMAL HIGH (ref 0.50–1.10)
GFR calc Af Amer: 41 mL/min — ABNORMAL LOW (ref >60)
GFR calc non Af Amer: 34 mL/min — ABNORMAL LOW (ref >60)
Glucose, Bld: 127 mg/dL — ABNORMAL HIGH (ref 70–99)

## 2010-07-29 LAB — CBC
HCT: 38.4 % (ref 36.0–46.0)
Hemoglobin: 12 g/dL (ref 12.0–15.0)
MCH: 27.5 pg (ref 26.0–34.0)
MCHC: 31.3 g/dL (ref 30.0–36.0)

## 2010-07-29 LAB — DIFFERENTIAL
Basophils Absolute: 0.1 10*3/uL (ref 0.0–0.1)
Lymphocytes Relative: 40 % (ref 12–46)
Neutro Abs: 4.6 10*3/uL (ref 1.7–7.7)
Neutrophils Relative %: 49 % (ref 43–77)

## 2010-07-30 ENCOUNTER — Encounter (HOSPITAL_COMMUNITY): Payer: Self-pay | Admitting: Oncology

## 2010-07-30 ENCOUNTER — Other Ambulatory Visit (HOSPITAL_COMMUNITY): Payer: BC Managed Care – PPO

## 2010-07-30 ENCOUNTER — Encounter (HOSPITAL_BASED_OUTPATIENT_CLINIC_OR_DEPARTMENT_OTHER): Payer: BC Managed Care – PPO | Admitting: Oncology

## 2010-07-30 VITALS — BP 126/75 | HR 56 | Temp 98.4°F | Wt 151.2 lb

## 2010-07-30 DIAGNOSIS — R5383 Other fatigue: Secondary | ICD-10-CM

## 2010-07-30 DIAGNOSIS — R5381 Other malaise: Secondary | ICD-10-CM

## 2010-07-30 DIAGNOSIS — C189 Malignant neoplasm of colon, unspecified: Secondary | ICD-10-CM

## 2010-07-30 DIAGNOSIS — S31109A Unspecified open wound of abdominal wall, unspecified quadrant without penetration into peritoneal cavity, initial encounter: Secondary | ICD-10-CM

## 2010-07-30 NOTE — Patient Instructions (Signed)
[facility]  Discharge Instructions  RECOMMENDATIONS MADE BY THE CONSULTANT AND ANY TEST RESULTS WILL BE SENT TO YOUR REFERRING DOCTOR.   EXAM FINDINGS BY MD TODAY AND SIGNS AND SYMPTOMS TO REPORT TO CLINIC OR PRIMARY MD: Doing well    SPECIAL INSTRUCTIONS/FOLLOW-UP: Lab work Needed in 3 months and 6 months and will see PA after.  Bone Density needs to be done in 2013.   I acknowledge that I have been informed and understand all the instructions given to me and received a copy. I do not have any more questions at this time, but understand that I may call the Specialty Clinic at [facility] at [phone] during business hours should I have any further questions or need assistance in obtaining follow-up care.    __________________________________________  _____________  __________ Signature of Patient or Authorized Representative            Date                   Time    __________________________________________ Nurse's Signature

## 2010-07-30 NOTE — Progress Notes (Signed)
Heather Hippo, MD, MD [address], [address] [place] [address]  1. Colon cancer  CEA, CEA, CBC, Comprehensive metabolic panel, Differential    CURRENT THERAPY:S/P 6 cycles of oxaliplatin and capecitabine postoperatively following surgery on 10/19/2005.  INTERVAL HISTORY: Heather [name] 70 y.o. female returns for a regular visit for followup of Stage III colon cancer with 1/10 positive lymph nodes, intermediate grade, 4 cm in size s/p chemotherapy and resection.  Today the patient complains about her low energy level.  This is likely related to her age.  She is 70 years old.  She utilizes a cane for balance purposes and to prevent falls.  Today she explains that she has fallen twice in about one year but denies any injuries.  Her most recent fall was a few months ago when she was moving with her husband into a new house.  She denies any complaints this morning.  Past Medical History  Diagnosis Date  . Diabetes mellitus   . Hypertension   . Thyroid disease   . Colon cancer   . COPD (chronic obstructive pulmonary disease)   . MRSA infection   . Pulmonary embolus   . Deep vein thrombosis   . Pneumonia   . Rheumatic fever   . Dehydration   . MRSA (methicillin resistant staph aureus) culture positive     has Colon cancer on her problem list.     is allergic to motrin; sulfa drugs cross reactors; and iohexol.  Heather [name] does not currently have medications on file.  Past Surgical History  Procedure Date  . Back surgery   . Partial hysterectomy   . Cholecystectomy   . Colostomy takedown   . Arthroscopic repair acl   . Hernia repair   . Breast cyst excision   . Gallbladder surgery   . Back surgery   . Bile duct exploration   . Bladder tact     '70s  . Cystitis   . Port-a-cath removal   . Abcess drainage     abdominal wall    She denies any headaches, dizziness, double vision, fevers, chills, night sweats, nausea, vomiting, diarrhea, constipation, chest pain,  heart palpitations, shortness of breath, blood in stool, black tarry stool, urinary pain, urinary burning, urinary frequency, hematuria.   PHYSICAL EXAMINATION  Filed Vitals:   07/30/10 0954  BP: 126/75  Pulse: 56  Temp: 98.4 F (36.9 C)    GENERAL:alert, no distress, comfortable and cooperative SKIN: skin color, texture, turgor are normal, no rashes or significant lesions HEAD: Normocephalic, No masses, lesions, tenderness or abnormalities EYES: normal EARS: External ears normal OROPHARYNX:Not examined  NECK: supple LYMPH:  not examined BREAST:not examined LUNGS: clear to auscultation and percussion HEART: regular rate & rhythm, no murmurs, no gallops, S1 normal and S2 normal ABDOMEN:abdomen soft, non-tender, obese and normal bowel sounds BACK: Back symmetric, no curvature. EXTREMITIES:less then 2 second capillary refill, no joint deformities, effusion, or inflammation, no skin discoloration, positive findings:  edema in LE 1+ pitting  NEURO: alert & oriented x 3 with fluent speech, no focal motor/sensory deficits   LABORATORY DATA:  Lab Results  Component Value Date   CEA 3.3 07/29/2010       ASSESSMENT:  1. Stage III Colon cancer S/P chemotherapy and resection on 10/19/05 2. Abdominal wall abscess drained by Dr. Jimmye Norman on 09/16/09 still with an open area. 3. Fatigue 4. History of falling, without injuries.  Using a cane for balance, declines a walker today.  PLAN:  1. CEA in 3 months 2. CBC with Diff, CMET, CEA in 6 months 3. Return in 6 months 4. Dexa Scan in 2013   All questions were answered. The patient knows to call the clinic with any problems, questions or concerns. We can certainly see the patient much sooner if necessary.  The patient and plan discussed with Glenford Peers, MD and he is in agreement with the aforementioned.  I spent 25 minutes counseling the patient face to face. The total time spent in the appointment was 40  minutes.  Deklin Bieler

## 2010-08-25 ENCOUNTER — Other Ambulatory Visit (HOSPITAL_COMMUNITY): Payer: Self-pay

## 2010-09-10 ENCOUNTER — Telehealth (HOSPITAL_COMMUNITY): Payer: Self-pay

## 2010-09-10 NOTE — Telephone Encounter (Signed)
error 

## 2010-09-25 ENCOUNTER — Telehealth (HOSPITAL_COMMUNITY): Payer: Self-pay

## 2010-09-25 NOTE — Telephone Encounter (Signed)
Reminder message to schedule Bone Density 08/2011.

## 2010-09-30 ENCOUNTER — Encounter (HOSPITAL_COMMUNITY): Payer: BC Managed Care – PPO | Attending: Oncology

## 2010-09-30 DIAGNOSIS — C189 Malignant neoplasm of colon, unspecified: Secondary | ICD-10-CM | POA: Insufficient documentation

## 2010-09-30 NOTE — Progress Notes (Signed)
Labs drawn today for cea 

## 2010-10-16 LAB — DIFFERENTIAL
Basophils Relative: 1
Eosinophils Absolute: 0.3
Lymphs Abs: 3.1
Monocytes Relative: 7
Neutro Abs: 4.2
Neutrophils Relative %: 51

## 2010-10-16 LAB — CBC
HCT: 41.2
Hemoglobin: 13.5
MCHC: 32.7
RBC: 4.9
RDW: 16.5 — ABNORMAL HIGH

## 2010-10-16 LAB — COMPREHENSIVE METABOLIC PANEL
ALT: 24
Alkaline Phosphatase: 120 — ABNORMAL HIGH
BUN: 22
CO2: 25
Calcium: 10
GFR calc non Af Amer: 47 — ABNORMAL LOW
Glucose, Bld: 128 — ABNORMAL HIGH
Total Protein: 8.4 — ABNORMAL HIGH

## 2010-10-16 LAB — APTT: aPTT: 40 — ABNORMAL HIGH

## 2010-10-16 LAB — PROTIME-INR
INR: 2.1 — ABNORMAL HIGH
Prothrombin Time: 25.3 — ABNORMAL HIGH

## 2010-10-16 LAB — CEA: CEA: 2.5

## 2010-10-17 ENCOUNTER — Encounter (INDEPENDENT_AMBULATORY_CARE_PROVIDER_SITE_OTHER): Payer: Self-pay | Admitting: *Deleted

## 2010-10-17 LAB — GLUCOSE, CAPILLARY
Glucose-Capillary: 104 — ABNORMAL HIGH
Glucose-Capillary: 106 — ABNORMAL HIGH
Glucose-Capillary: 110 — ABNORMAL HIGH
Glucose-Capillary: 113 — ABNORMAL HIGH
Glucose-Capillary: 114 — ABNORMAL HIGH
Glucose-Capillary: 121 — ABNORMAL HIGH
Glucose-Capillary: 133 — ABNORMAL HIGH
Glucose-Capillary: 138 — ABNORMAL HIGH
Glucose-Capillary: 139 — ABNORMAL HIGH
Glucose-Capillary: 139 — ABNORMAL HIGH
Glucose-Capillary: 163 — ABNORMAL HIGH
Glucose-Capillary: 173 — ABNORMAL HIGH
Glucose-Capillary: 193 — ABNORMAL HIGH
Glucose-Capillary: 196 — ABNORMAL HIGH
Glucose-Capillary: 204 — ABNORMAL HIGH
Glucose-Capillary: 212 — ABNORMAL HIGH
Glucose-Capillary: 41 — ABNORMAL LOW
Glucose-Capillary: 81
Glucose-Capillary: 92
Glucose-Capillary: 94
Glucose-Capillary: 95
Glucose-Capillary: 96
Glucose-Capillary: 96
Glucose-Capillary: 103 — ABNORMAL HIGH
Glucose-Capillary: 105 — ABNORMAL HIGH
Glucose-Capillary: 118 — ABNORMAL HIGH
Glucose-Capillary: 122 — ABNORMAL HIGH
Glucose-Capillary: 123 — ABNORMAL HIGH
Glucose-Capillary: 129 — ABNORMAL HIGH
Glucose-Capillary: 145 — ABNORMAL HIGH
Glucose-Capillary: 155 — ABNORMAL HIGH
Glucose-Capillary: 164 — ABNORMAL HIGH
Glucose-Capillary: 168 — ABNORMAL HIGH
Glucose-Capillary: 172 — ABNORMAL HIGH
Glucose-Capillary: 181 — ABNORMAL HIGH
Glucose-Capillary: 187 — ABNORMAL HIGH
Glucose-Capillary: 198 — ABNORMAL HIGH
Glucose-Capillary: 201 — ABNORMAL HIGH
Glucose-Capillary: 202 — ABNORMAL HIGH
Glucose-Capillary: 203 — ABNORMAL HIGH
Glucose-Capillary: 216 — ABNORMAL HIGH
Glucose-Capillary: 226 — ABNORMAL HIGH
Glucose-Capillary: 228 — ABNORMAL HIGH
Glucose-Capillary: 230 — ABNORMAL HIGH
Glucose-Capillary: 236 — ABNORMAL HIGH
Glucose-Capillary: 244 — ABNORMAL HIGH
Glucose-Capillary: 258 — ABNORMAL HIGH
Glucose-Capillary: 262 — ABNORMAL HIGH
Glucose-Capillary: 280 — ABNORMAL HIGH
Glucose-Capillary: 284 — ABNORMAL HIGH
Glucose-Capillary: 286 — ABNORMAL HIGH
Glucose-Capillary: 290 — ABNORMAL HIGH
Glucose-Capillary: 68 — ABNORMAL LOW
Glucose-Capillary: 74
Glucose-Capillary: 85
Glucose-Capillary: 94
Glucose-Capillary: 98

## 2010-10-17 LAB — CBC
HCT: 25.2 — ABNORMAL LOW
HCT: 29.1 — ABNORMAL LOW
HCT: 38
HCT: 32.6 — ABNORMAL LOW
HCT: 35.3 — ABNORMAL LOW
Hemoglobin: 10.8 — ABNORMAL LOW
Hemoglobin: 12.7
Hemoglobin: 9.7 — ABNORMAL LOW
Hemoglobin: 12.7
Hemoglobin: 11.2 — ABNORMAL LOW
Hemoglobin: 11.3 — ABNORMAL LOW
Hemoglobin: 11.9 — ABNORMAL LOW
MCHC: 32.9
MCHC: 32.9
MCHC: 33
MCHC: 33.5
MCHC: 33.8
MCHC: 33.9
MCHC: 34.5
MCV: 83
MCV: 83.4
MCV: 84.2
Platelets: 279
Platelets: 303
Platelets: 361
Platelets: 450 — ABNORMAL HIGH
RBC: 3.9
RBC: 3.87
RBC: 4.03
RBC: 4.14
RDW: 15.4
RDW: 16.7 — ABNORMAL HIGH
RDW: 16.5 — ABNORMAL HIGH
RDW: 16.9 — ABNORMAL HIGH
RDW: 16.9 — ABNORMAL HIGH
RDW: 17.2 — ABNORMAL HIGH
RDW: 17.3 — ABNORMAL HIGH
RDW: 18.2 — ABNORMAL HIGH
WBC: 16.7 — ABNORMAL HIGH
WBC: 9.7
WBC: 13.5 — ABNORMAL HIGH
WBC: 8.6

## 2010-10-17 LAB — DIFFERENTIAL
Basophils Relative: 1
Basophils Relative: 0
Eosinophils Absolute: 0.3
Eosinophils Absolute: 0.1
Eosinophils Absolute: 0.3
Eosinophils Relative: 3
Lymphs Abs: 3.3
Lymphs Abs: 1.5
Lymphs Abs: 1.9
Monocytes Absolute: 0.5
Monocytes Absolute: 1
Monocytes Relative: 5
Monocytes Relative: 11
Monocytes Relative: 8
Neutro Abs: 6.3
Neutro Abs: 9.7 — ABNORMAL HIGH
Neutrophils Relative %: 52
Neutrophils Relative %: 65

## 2010-10-17 LAB — BASIC METABOLIC PANEL
BUN: 12
BUN: 16
BUN: 28 — ABNORMAL HIGH
BUN: 10
BUN: 17
BUN: 20
BUN: 26 — ABNORMAL HIGH
BUN: 28 — ABNORMAL HIGH
CO2: 19
CO2: 23
CO2: 27
CO2: 26
CO2: 23
Calcium: 8.2 — ABNORMAL LOW
Calcium: 8.4
Calcium: 7.9 — ABNORMAL LOW
Calcium: 8.1 — ABNORMAL LOW
Calcium: 8.4
Chloride: 105
Chloride: 108
Chloride: 109
Chloride: 109
Chloride: 102
Chloride: 102
Chloride: 105
Chloride: 107
Creatinine, Ser: 0.86
Creatinine, Ser: 1.19
Creatinine, Ser: 0.82
Creatinine, Ser: 0.84
Creatinine, Ser: 1.31 — ABNORMAL HIGH
GFR calc Af Amer: 28 — ABNORMAL LOW
GFR calc Af Amer: 44 — ABNORMAL LOW
GFR calc Af Amer: >60
GFR calc Af Amer: 49 — ABNORMAL LOW
GFR calc non Af Amer: 23 — ABNORMAL LOW
GFR calc non Af Amer: >60
GFR calc non Af Amer: 57 — ABNORMAL LOW
GFR calc non Af Amer: >60
Glucose, Bld: 124 — ABNORMAL HIGH
Glucose, Bld: 93
Glucose, Bld: 159 — ABNORMAL HIGH
Glucose, Bld: 171 — ABNORMAL HIGH
Glucose, Bld: 195 — ABNORMAL HIGH
Glucose, Bld: 70
Potassium: 4
Potassium: 4.5
Potassium: 4.3
Potassium: 3.8
Potassium: 4.1
Potassium: 4.3
Potassium: 4.7
Sodium: 132 — ABNORMAL LOW
Sodium: 133 — ABNORMAL LOW
Sodium: 136
Sodium: 137
Sodium: 135
Sodium: 133 — ABNORMAL LOW
Sodium: 133 — ABNORMAL LOW

## 2010-10-17 LAB — COMPREHENSIVE METABOLIC PANEL
ALT: 16
ALT: 11
ALT: 12
ALT: 14
ALT: 14
AST: 19
Albumin: 3.3 — ABNORMAL LOW
Albumin: 1.6 — ABNORMAL LOW
Alkaline Phosphatase: 110
Alkaline Phosphatase: 117
Alkaline Phosphatase: 99
BUN: 14
BUN: 26 — ABNORMAL HIGH
CO2: 25
CO2: 27
Calcium: 10
Calcium: 7.8 — ABNORMAL LOW
Calcium: 7.8 — ABNORMAL LOW
Calcium: 8.4
Creatinine, Ser: 0.89
GFR calc Af Amer: >60
GFR calc Af Amer: >60
GFR calc non Af Amer: >60
GFR calc non Af Amer: >60
Glucose, Bld: 119 — ABNORMAL HIGH
Glucose, Bld: 172 — ABNORMAL HIGH
Glucose, Bld: 83
Potassium: 4.8
Potassium: 2.7 — CL
Sodium: 142
Sodium: 136
Sodium: 136
Sodium: 137
Total Protein: 6.9
Total Protein: 4.8 — ABNORMAL LOW
Total Protein: 5 — ABNORMAL LOW
Total Protein: 5.5 — ABNORMAL LOW
Total Protein: 6.2

## 2010-10-17 LAB — MAGNESIUM
Magnesium: 1.4 — ABNORMAL LOW
Magnesium: 1.9
Magnesium: 2.1

## 2010-10-17 LAB — CLOSTRIDIUM DIFFICILE EIA: C difficile Toxins A+B, EIA: NEGATIVE

## 2010-10-17 LAB — CHOLESTEROL, TOTAL: Cholesterol: 79

## 2010-10-17 LAB — PHOSPHORUS
Phosphorus: 2 — ABNORMAL LOW
Phosphorus: 2.7
Phosphorus: 3.9

## 2010-10-17 LAB — PROTIME-INR
INR: 1
INR: 1.1
INR: 1.1
INR: 1.2
INR: 1.2
Prothrombin Time: 14.7
Prothrombin Time: 15
Prothrombin Time: 15.5 — ABNORMAL HIGH
Prothrombin Time: 16 — ABNORMAL HIGH
Prothrombin Time: 16.6 — ABNORMAL HIGH

## 2010-10-17 LAB — URINALYSIS, ROUTINE W REFLEX MICROSCOPIC
Bilirubin Urine: NEGATIVE
Glucose, UA: NEGATIVE
Hgb urine dipstick: NEGATIVE
Protein, ur: NEGATIVE

## 2010-10-17 LAB — TYPE AND SCREEN
ABO/RH(D): O POS
Antibody Screen: NEGATIVE

## 2010-10-17 LAB — CROSSMATCH

## 2010-10-17 LAB — URINE MICROSCOPIC-ADD ON

## 2010-10-17 LAB — PREALBUMIN: Prealbumin: 12.8 — ABNORMAL LOW

## 2010-10-17 LAB — TRIGLYCERIDES: Triglycerides: 146

## 2010-10-20 LAB — GLUCOSE, CAPILLARY
Glucose-Capillary: 110 — ABNORMAL HIGH
Glucose-Capillary: 113 — ABNORMAL HIGH
Glucose-Capillary: 114 — ABNORMAL HIGH
Glucose-Capillary: 115 — ABNORMAL HIGH
Glucose-Capillary: 120 — ABNORMAL HIGH
Glucose-Capillary: 122 — ABNORMAL HIGH
Glucose-Capillary: 123 — ABNORMAL HIGH
Glucose-Capillary: 126 — ABNORMAL HIGH
Glucose-Capillary: 133 — ABNORMAL HIGH
Glucose-Capillary: 134 — ABNORMAL HIGH
Glucose-Capillary: 135 — ABNORMAL HIGH
Glucose-Capillary: 137 — ABNORMAL HIGH
Glucose-Capillary: 144 — ABNORMAL HIGH
Glucose-Capillary: 144 — ABNORMAL HIGH
Glucose-Capillary: 149 — ABNORMAL HIGH
Glucose-Capillary: 153 — ABNORMAL HIGH
Glucose-Capillary: 156 — ABNORMAL HIGH
Glucose-Capillary: 164 — ABNORMAL HIGH
Glucose-Capillary: 164 — ABNORMAL HIGH
Glucose-Capillary: 67 — ABNORMAL LOW
Glucose-Capillary: 75
Glucose-Capillary: 75
Glucose-Capillary: 79
Glucose-Capillary: 88
Glucose-Capillary: 89
Glucose-Capillary: 97

## 2010-10-20 LAB — COMPREHENSIVE METABOLIC PANEL
ALT: 16
ALT: 17
AST: 28
Alkaline Phosphatase: 105
Alkaline Phosphatase: 105
BUN: 31 — ABNORMAL HIGH
BUN: 31 — ABNORMAL HIGH
CO2: 25
CO2: 26
CO2: 28
Calcium: 8.9
Chloride: 106
Chloride: 107
Creatinine, Ser: 0.95
Creatinine, Ser: 1.04
Creatinine, Ser: 1.05
GFR calc Af Amer: >60
GFR calc non Af Amer: 53 — ABNORMAL LOW
GFR calc non Af Amer: 58 — ABNORMAL LOW
GFR calc non Af Amer: >60
Glucose, Bld: 106 — ABNORMAL HIGH
Glucose, Bld: 107 — ABNORMAL HIGH
Glucose, Bld: 115 — ABNORMAL HIGH
Glucose, Bld: 178 — ABNORMAL HIGH
Potassium: 4.3
Potassium: 4.6
Sodium: 136
Sodium: 136
Total Bilirubin: 0.4
Total Bilirubin: 0.5
Total Protein: 6
Total Protein: 6.5
Total Protein: 6.7

## 2010-10-20 LAB — PHOSPHORUS
Phosphorus: 3.5
Phosphorus: 3.6
Phosphorus: 4

## 2010-10-20 LAB — URINE MICROSCOPIC-ADD ON

## 2010-10-20 LAB — URINALYSIS, ROUTINE W REFLEX MICROSCOPIC
Glucose, UA: NEGATIVE
Glucose, UA: NEGATIVE
Hgb urine dipstick: NEGATIVE
Specific Gravity, Urine: 1.015
Specific Gravity, Urine: 1.017
Urobilinogen, UA: 0.2
Urobilinogen, UA: 0.2
pH: 7

## 2010-10-20 LAB — CBC
HCT: 28.1 — ABNORMAL LOW
Hemoglobin: 8.3 — ABNORMAL LOW
Hemoglobin: 9.2 — ABNORMAL LOW
Hemoglobin: 9.3 — ABNORMAL LOW
MCHC: 32.4
MCV: 85.5
Platelets: 355
RBC: 3.31 — ABNORMAL LOW
RBC: 3.36 — ABNORMAL LOW
RDW: 18.2 — ABNORMAL HIGH
RDW: 18.7 — ABNORMAL HIGH
WBC: 7.4
WBC: 9

## 2010-10-20 LAB — BASIC METABOLIC PANEL
BUN: 27 — ABNORMAL HIGH
BUN: 30 — ABNORMAL HIGH
CO2: 26
CO2: 27
Chloride: 107
Chloride: 107
Creatinine, Ser: 0.97
GFR calc Af Amer: >60
Glucose, Bld: 72
Potassium: 4
Potassium: 4.4

## 2010-10-20 LAB — URINE CULTURE
Colony Count: >=100000
Colony Count: >=100000
Special Requests: NEGATIVE

## 2010-10-20 LAB — MAGNESIUM
Magnesium: 1.8
Magnesium: 1.9
Magnesium: 1.9

## 2010-10-20 LAB — DIFFERENTIAL
Basophils Absolute: 0.1
Basophils Absolute: 0.1
Basophils Relative: 1
Eosinophils Absolute: 0.2
Eosinophils Absolute: 0.2
Eosinophils Relative: 2
Neutro Abs: 3.9
Neutrophils Relative %: 43
Neutrophils Relative %: 45

## 2010-10-20 LAB — PREALBUMIN
Prealbumin: 12.5 — ABNORMAL LOW
Prealbumin: 13.1 — ABNORMAL LOW

## 2010-10-21 LAB — COMPREHENSIVE METABOLIC PANEL
ALT: 42 — ABNORMAL HIGH
ALT: 47 — ABNORMAL HIGH
ALT: 54 — ABNORMAL HIGH
AST: 45 — ABNORMAL HIGH
AST: 46 — ABNORMAL HIGH
AST: 84 — ABNORMAL HIGH
Albumin: 1.7 — ABNORMAL LOW
Albumin: 1.8 — ABNORMAL LOW
Albumin: 2.4 — ABNORMAL LOW
Alkaline Phosphatase: 142 — ABNORMAL HIGH
Alkaline Phosphatase: 218 — ABNORMAL HIGH
Alkaline Phosphatase: 228 — ABNORMAL HIGH
Alkaline Phosphatase: 294 — ABNORMAL HIGH
BUN: 20
BUN: 23
BUN: 24 — ABNORMAL HIGH
BUN: 27 — ABNORMAL HIGH
BUN: 28 — ABNORMAL HIGH
BUN: 36 — ABNORMAL HIGH
CO2: 26
CO2: 27
CO2: 28
CO2: 29
CO2: 29
CO2: 31
Calcium: 7.9 — ABNORMAL LOW
Calcium: 8.3 — ABNORMAL LOW
Calcium: 8.4
Calcium: 8.6
Calcium: 8.8
Calcium: 8.8
Chloride: 100
Chloride: 103
Chloride: 105
Creatinine, Ser: 0.67
Creatinine, Ser: 0.74
Creatinine, Ser: 0.74
Creatinine, Ser: 0.77
Creatinine, Ser: 0.95
Creatinine, Ser: 1.39 — ABNORMAL HIGH
GFR calc Af Amer: 45 — ABNORMAL LOW
GFR calc Af Amer: 54 — ABNORMAL LOW
GFR calc Af Amer: >60
GFR calc Af Amer: >60
GFR calc Af Amer: >60
GFR calc Af Amer: >60
GFR calc non Af Amer: 58 — ABNORMAL LOW
GFR calc non Af Amer: >60
GFR calc non Af Amer: >60
GFR calc non Af Amer: >60
GFR calc non Af Amer: >60
GFR calc non Af Amer: >60
Glucose, Bld: 105 — ABNORMAL HIGH
Glucose, Bld: 135 — ABNORMAL HIGH
Glucose, Bld: 153 — ABNORMAL HIGH
Glucose, Bld: 154 — ABNORMAL HIGH
Glucose, Bld: 164 — ABNORMAL HIGH
Glucose, Bld: 179 — ABNORMAL HIGH
Glucose, Bld: 184 — ABNORMAL HIGH
Glucose, Bld: 279 — ABNORMAL HIGH
Potassium: 3.7
Potassium: 4
Potassium: 4.4
Potassium: 5
Sodium: 131 — ABNORMAL LOW
Sodium: 131 — ABNORMAL LOW
Sodium: 136
Sodium: 137
Total Bilirubin: 0.4
Total Bilirubin: 0.4
Total Bilirubin: 0.5
Total Bilirubin: 0.6
Total Protein: 5.7 — ABNORMAL LOW
Total Protein: 6
Total Protein: 6.3
Total Protein: 6.6
Total Protein: 7.1
Total Protein: 7.9

## 2010-10-21 LAB — GLUCOSE, CAPILLARY
Glucose-Capillary: 100 — ABNORMAL HIGH
Glucose-Capillary: 102 — ABNORMAL HIGH
Glucose-Capillary: 106 — ABNORMAL HIGH
Glucose-Capillary: 107 — ABNORMAL HIGH
Glucose-Capillary: 108 — ABNORMAL HIGH
Glucose-Capillary: 113 — ABNORMAL HIGH
Glucose-Capillary: 117 — ABNORMAL HIGH
Glucose-Capillary: 118 — ABNORMAL HIGH
Glucose-Capillary: 119 — ABNORMAL HIGH
Glucose-Capillary: 125 — ABNORMAL HIGH
Glucose-Capillary: 126 — ABNORMAL HIGH
Glucose-Capillary: 126 — ABNORMAL HIGH
Glucose-Capillary: 130 — ABNORMAL HIGH
Glucose-Capillary: 132 — ABNORMAL HIGH
Glucose-Capillary: 133 — ABNORMAL HIGH
Glucose-Capillary: 133 — ABNORMAL HIGH
Glucose-Capillary: 134 — ABNORMAL HIGH
Glucose-Capillary: 137 — ABNORMAL HIGH
Glucose-Capillary: 143 — ABNORMAL HIGH
Glucose-Capillary: 146 — ABNORMAL HIGH
Glucose-Capillary: 147 — ABNORMAL HIGH
Glucose-Capillary: 147 — ABNORMAL HIGH
Glucose-Capillary: 148 — ABNORMAL HIGH
Glucose-Capillary: 148 — ABNORMAL HIGH
Glucose-Capillary: 154 — ABNORMAL HIGH
Glucose-Capillary: 156 — ABNORMAL HIGH
Glucose-Capillary: 161 — ABNORMAL HIGH
Glucose-Capillary: 177 — ABNORMAL HIGH
Glucose-Capillary: 180 — ABNORMAL HIGH
Glucose-Capillary: 180 — ABNORMAL HIGH
Glucose-Capillary: 185 — ABNORMAL HIGH
Glucose-Capillary: 193 — ABNORMAL HIGH
Glucose-Capillary: 198 — ABNORMAL HIGH
Glucose-Capillary: 221 — ABNORMAL HIGH
Glucose-Capillary: 83
Glucose-Capillary: 85
Glucose-Capillary: 88
Glucose-Capillary: 95
Glucose-Capillary: 95
Glucose-Capillary: 95
Glucose-Capillary: 96
Glucose-Capillary: 96

## 2010-10-21 LAB — BASIC METABOLIC PANEL
BUN: 22
CO2: 28
CO2: 30
Calcium: 8.2 — ABNORMAL LOW
Calcium: 8.5
Calcium: 8.6
Chloride: 100
Chloride: 101
Creatinine, Ser: 0.83
Creatinine, Ser: 0.86
GFR calc Af Amer: >60
GFR calc Af Amer: >60
GFR calc Af Amer: >60
GFR calc Af Amer: >60
GFR calc non Af Amer: >60
Glucose, Bld: 134 — ABNORMAL HIGH
Potassium: 4.8
Sodium: 135
Sodium: 135

## 2010-10-21 LAB — URINALYSIS, ROUTINE W REFLEX MICROSCOPIC
Bilirubin Urine: NEGATIVE
Glucose, UA: NEGATIVE
Hgb urine dipstick: NEGATIVE
Ketones, ur: NEGATIVE
Nitrite: NEGATIVE
Protein, ur: NEGATIVE
Specific Gravity, Urine: 1.017
Urobilinogen, UA: 0.2
pH: 7

## 2010-10-21 LAB — DIFFERENTIAL
Basophils Absolute: 0
Basophils Absolute: 0
Basophils Relative: 0
Basophils Relative: 0
Eosinophils Absolute: 0
Eosinophils Absolute: 0
Eosinophils Absolute: 0.2
Eosinophils Relative: 0
Lymphocytes Relative: 11 — ABNORMAL LOW
Lymphocytes Relative: 30
Lymphocytes Relative: 41
Lymphs Abs: 1.6
Lymphs Abs: 2
Lymphs Abs: 3.6
Lymphs Abs: 3.9
Monocytes Absolute: 0.3
Monocytes Absolute: 0.6
Monocytes Relative: 2 — ABNORMAL LOW
Monocytes Relative: 5
Monocytes Relative: 7
Monocytes Relative: 7
Neutro Abs: 12.2 — ABNORMAL HIGH
Neutro Abs: 3.6
Neutrophils Relative %: 48
Neutrophils Relative %: 50
Neutrophils Relative %: 58
Neutrophils Relative %: 64
Neutrophils Relative %: 86 — ABNORMAL HIGH

## 2010-10-21 LAB — COMPREHENSIVE METABOLIC PANEL WITH GFR
ALT: 71 — ABNORMAL HIGH
AST: 87 — ABNORMAL HIGH
CO2: 22
Calcium: 9.1
Chloride: 99
Creatinine, Ser: 1.2
GFR calc non Af Amer: 45 — ABNORMAL LOW
Glucose, Bld: 192 — ABNORMAL HIGH
Total Bilirubin: 0.9

## 2010-10-21 LAB — PROTIME-INR
INR: 1.2
INR: 1.3
INR: 1.4
INR: 1.4
INR: 1.4
INR: 1.5
INR: 2.1 — ABNORMAL HIGH
INR: 2.7 — ABNORMAL HIGH
INR: 2.8 — ABNORMAL HIGH
INR: 2.8 — ABNORMAL HIGH
INR: 2.8 — ABNORMAL HIGH
Prothrombin Time: 15.1
Prothrombin Time: 16 — ABNORMAL HIGH
Prothrombin Time: 16.1 — ABNORMAL HIGH
Prothrombin Time: 17.9 — ABNORMAL HIGH
Prothrombin Time: 18.8 — ABNORMAL HIGH
Prothrombin Time: 19.4 — ABNORMAL HIGH
Prothrombin Time: 19.7 — ABNORMAL HIGH
Prothrombin Time: 30.6 — ABNORMAL HIGH
Prothrombin Time: 31.3 — ABNORMAL HIGH

## 2010-10-21 LAB — URINE CULTURE
Colony Count: >=100000
Special Requests: NEGATIVE

## 2010-10-21 LAB — CULTURE, BLOOD (ROUTINE X 2)

## 2010-10-21 LAB — CBC
HCT: 25.4 — ABNORMAL LOW
HCT: 26.5 — ABNORMAL LOW
HCT: 34 — ABNORMAL LOW
Hemoglobin: 11.3 — ABNORMAL LOW
Hemoglobin: 8.4 — ABNORMAL LOW
Hemoglobin: 8.6 — ABNORMAL LOW
Hemoglobin: 8.7 — ABNORMAL LOW
Hemoglobin: 8.7 — ABNORMAL LOW
Hemoglobin: 8.7 — ABNORMAL LOW
MCHC: 32.5
MCHC: 32.6
MCHC: 32.7
MCHC: 33
MCHC: 33
MCHC: 33.3
MCHC: 33.4
MCV: 83.5
MCV: 83.9
MCV: 84.1
MCV: 84.2
MCV: 84.5
MCV: 84.6
Platelets: 380
Platelets: 394
Platelets: 429 — ABNORMAL HIGH
Platelets: 451 — ABNORMAL HIGH
Platelets: 480 — ABNORMAL HIGH
RBC: 2.91 — ABNORMAL LOW
RBC: 3.07 — ABNORMAL LOW
RBC: 3.09 — ABNORMAL LOW
RBC: 3.13 — ABNORMAL LOW
RBC: 4.07
RDW: 16.5 — ABNORMAL HIGH
RDW: 16.6 — ABNORMAL HIGH
RDW: 16.7 — ABNORMAL HIGH
RDW: 16.8 — ABNORMAL HIGH
RDW: 16.9 — ABNORMAL HIGH
RDW: 17.1 — ABNORMAL HIGH
RDW: 17.2 — ABNORMAL HIGH
WBC: 14.2 — ABNORMAL HIGH
WBC: 6.2
WBC: 8.5

## 2010-10-21 LAB — PREALBUMIN
Prealbumin: 11.1 — ABNORMAL LOW
Prealbumin: 4.5 — ABNORMAL LOW
Prealbumin: 8.2 — ABNORMAL LOW

## 2010-10-21 LAB — MAGNESIUM
Magnesium: 1.7
Magnesium: 1.7
Magnesium: 1.8
Magnesium: 1.8

## 2010-10-21 LAB — VANCOMYCIN, TROUGH
Vancomycin Tr: 19.2
Vancomycin Tr: 6.9 — ABNORMAL LOW

## 2010-10-21 LAB — CULTURE, BLOOD (SINGLE)

## 2010-10-21 LAB — PHOSPHORUS
Phosphorus: 1.7 — ABNORMAL LOW
Phosphorus: 2.7
Phosphorus: 3
Phosphorus: 3
Phosphorus: 4.2
Phosphorus: 4.5

## 2010-10-21 LAB — TRIGLYCERIDES
Triglycerides: 174 — ABNORMAL HIGH
Triglycerides: 214 — ABNORMAL HIGH

## 2010-10-21 LAB — URINE MICROSCOPIC-ADD ON

## 2010-10-21 LAB — BLOOD GAS, ARTERIAL
Acid-base deficit: 2.7 — ABNORMAL HIGH
Patient temperature: 98.6
TCO2: 19.6

## 2010-10-21 LAB — CARDIOLIPIN ANTIBODIES, IGG, IGM, IGA
Anticardiolipin IgA: 12 — ABNORMAL LOW (ref <13)
Anticardiolipin IgG: <7 — ABNORMAL LOW (ref <11)
Anticardiolipin IgM: <7 — ABNORMAL LOW (ref <10)

## 2010-10-21 LAB — PROTHROMBIN GENE MUTATION

## 2010-10-21 LAB — LIPASE, BLOOD: Lipase: <10 — ABNORMAL LOW

## 2010-10-21 LAB — PROTEIN S ACTIVITY: Protein S Activity: 52 % — ABNORMAL LOW (ref 69–129)

## 2010-10-21 LAB — FECAL LACTOFERRIN, QUANT

## 2010-10-21 LAB — LUPUS ANTICOAGULANT PANEL
PTT Lupus Anticoagulant: 59.1 — ABNORMAL HIGH (ref 36.3–48.8)
PTTLA Confirmation: 10.3 — ABNORMAL HIGH (ref <8.0)

## 2010-10-21 LAB — TYPE AND SCREEN: Antibody Screen: NEGATIVE

## 2010-10-21 LAB — CLOSTRIDIUM DIFFICILE EIA: C difficile Toxins A+B, EIA: NEGATIVE

## 2010-10-21 LAB — ANTITHROMBIN III: AntiThromb III Func: 73 — ABNORMAL LOW (ref 76–126)

## 2010-10-21 LAB — TSH: TSH: 7.534 — ABNORMAL HIGH

## 2010-10-21 LAB — BETA-2-GLYCOPROTEIN I ABS, IGG/M/A: Beta-2 Glyco I IgG: 4 U/mL (ref <20)

## 2010-10-21 LAB — FACTOR 5 LEIDEN

## 2010-10-21 LAB — CHOLESTEROL, TOTAL: Cholesterol: 140

## 2010-10-21 LAB — PROTEIN C ACTIVITY: Protein C Activity: 67 % — ABNORMAL LOW (ref 75–133)

## 2010-10-22 LAB — GLUCOSE, CAPILLARY
Glucose-Capillary: 100 — ABNORMAL HIGH
Glucose-Capillary: 103 — ABNORMAL HIGH
Glucose-Capillary: 103 — ABNORMAL HIGH
Glucose-Capillary: 113 — ABNORMAL HIGH
Glucose-Capillary: 122 — ABNORMAL HIGH
Glucose-Capillary: 125 — ABNORMAL HIGH
Glucose-Capillary: 129 — ABNORMAL HIGH
Glucose-Capillary: 137 — ABNORMAL HIGH
Glucose-Capillary: 140 — ABNORMAL HIGH
Glucose-Capillary: 142 — ABNORMAL HIGH
Glucose-Capillary: 143 — ABNORMAL HIGH
Glucose-Capillary: 154 — ABNORMAL HIGH
Glucose-Capillary: 156 — ABNORMAL HIGH
Glucose-Capillary: 157 — ABNORMAL HIGH
Glucose-Capillary: 159 — ABNORMAL HIGH
Glucose-Capillary: 166 — ABNORMAL HIGH
Glucose-Capillary: 166 — ABNORMAL HIGH
Glucose-Capillary: 169 — ABNORMAL HIGH
Glucose-Capillary: 172 — ABNORMAL HIGH
Glucose-Capillary: 172 — ABNORMAL HIGH
Glucose-Capillary: 178 — ABNORMAL HIGH
Glucose-Capillary: 184 — ABNORMAL HIGH
Glucose-Capillary: 185 — ABNORMAL HIGH
Glucose-Capillary: 187 — ABNORMAL HIGH
Glucose-Capillary: 189 — ABNORMAL HIGH
Glucose-Capillary: 192 — ABNORMAL HIGH
Glucose-Capillary: 192 — ABNORMAL HIGH
Glucose-Capillary: 196 — ABNORMAL HIGH
Glucose-Capillary: 197 — ABNORMAL HIGH
Glucose-Capillary: 202 — ABNORMAL HIGH
Glucose-Capillary: 206 — ABNORMAL HIGH
Glucose-Capillary: 236 — ABNORMAL HIGH
Glucose-Capillary: 87
Glucose-Capillary: 88
Glucose-Capillary: 91

## 2010-10-22 LAB — COMPREHENSIVE METABOLIC PANEL
ALT: 13
ALT: 23
AST: 24
AST: 28
AST: 49 — ABNORMAL HIGH
Albumin: 1.9 — ABNORMAL LOW
Alkaline Phosphatase: 104
Alkaline Phosphatase: 115
Alkaline Phosphatase: 145 — ABNORMAL HIGH
Alkaline Phosphatase: 172 — ABNORMAL HIGH
BUN: 11
BUN: 20
BUN: 21
CO2: 26
CO2: 27
CO2: 28
Calcium: 7.7 — ABNORMAL LOW
Calcium: 8.9
Calcium: 9
Chloride: 103
Chloride: 104
Chloride: 106
Creatinine, Ser: 0.81
Creatinine, Ser: 0.88
GFR calc Af Amer: >60
GFR calc Af Amer: >60
GFR calc non Af Amer: >60
GFR calc non Af Amer: >60
Glucose, Bld: 145 — ABNORMAL HIGH
Glucose, Bld: 166 — ABNORMAL HIGH
Glucose, Bld: 174 — ABNORMAL HIGH
Glucose, Bld: 81
Glucose, Bld: 98
Potassium: 3.7
Potassium: 4.1
Potassium: 4.4
Potassium: 4.4
Sodium: 133 — ABNORMAL LOW
Sodium: 139
Total Bilirubin: 0.3
Total Bilirubin: 0.4
Total Bilirubin: 0.4
Total Protein: 5.6 — ABNORMAL LOW
Total Protein: 6.5
Total Protein: 7.2
Total Protein: 7.5

## 2010-10-22 LAB — BASIC METABOLIC PANEL
BUN: 14
BUN: 18
BUN: 28 — ABNORMAL HIGH
BUN: 29 — ABNORMAL HIGH
BUN: 30 — ABNORMAL HIGH
BUN: 9
CO2: 24
CO2: 24
CO2: 25
CO2: 26
CO2: 26
CO2: 27
CO2: 28
Calcium: 7.6 — ABNORMAL LOW
Calcium: 7.7 — ABNORMAL LOW
Calcium: 8.3 — ABNORMAL LOW
Calcium: 8.7
Calcium: 8.7
Calcium: 8.9
Calcium: 9
Chloride: 101
Chloride: 105
Chloride: 106
Chloride: 108
Chloride: 110
Creatinine, Ser: 0.75
Creatinine, Ser: 0.75
Creatinine, Ser: 0.84
Creatinine, Ser: 0.85
Creatinine, Ser: 0.85
Creatinine, Ser: 0.9
Creatinine, Ser: 0.93
GFR calc Af Amer: >60
GFR calc Af Amer: >60
GFR calc Af Amer: >60
GFR calc Af Amer: >60
GFR calc non Af Amer: 60 — ABNORMAL LOW
GFR calc non Af Amer: >60
GFR calc non Af Amer: >60
GFR calc non Af Amer: >60
GFR calc non Af Amer: >60
Glucose, Bld: 131 — ABNORMAL HIGH
Glucose, Bld: 142 — ABNORMAL HIGH
Glucose, Bld: 155 — ABNORMAL HIGH
Glucose, Bld: 185 — ABNORMAL HIGH
Glucose, Bld: 189 — ABNORMAL HIGH
Glucose, Bld: 197 — ABNORMAL HIGH
Glucose, Bld: 88
Potassium: 4.2
Potassium: 4.3
Potassium: 4.4
Potassium: 4.5
Potassium: 4.5
Potassium: 4.6
Sodium: 134 — ABNORMAL LOW
Sodium: 134 — ABNORMAL LOW
Sodium: 135
Sodium: 137
Sodium: 137

## 2010-10-22 LAB — CBC
HCT: 24.5 — ABNORMAL LOW
HCT: 24.8 — ABNORMAL LOW
HCT: 29.6 — ABNORMAL LOW
Hemoglobin: 8 — ABNORMAL LOW
Hemoglobin: 8.1 — ABNORMAL LOW
Hemoglobin: 8.8 — ABNORMAL LOW
Hemoglobin: 9.8 — ABNORMAL LOW
MCHC: 32.2
MCHC: 32.9
MCHC: 33.2
MCV: 82.7
MCV: 84.9
RBC: 2.92 — ABNORMAL LOW
RBC: 3.19 — ABNORMAL LOW
RBC: 3.58 — ABNORMAL LOW
RDW: 16.9 — ABNORMAL HIGH
RDW: 17.4 — ABNORMAL HIGH
RDW: 19.2 — ABNORMAL HIGH
WBC: 9.4

## 2010-10-22 LAB — TRIGLYCERIDES
Triglycerides: 127
Triglycerides: 176 — ABNORMAL HIGH
Triglycerides: 242 — ABNORMAL HIGH
Triglycerides: 289 — ABNORMAL HIGH

## 2010-10-22 LAB — DIFFERENTIAL
Basophils Absolute: 0
Basophils Absolute: 0.2 — ABNORMAL HIGH
Basophils Relative: 1
Basophils Relative: 2 — ABNORMAL HIGH
Eosinophils Absolute: 0.1
Eosinophils Absolute: 0.1
Eosinophils Absolute: 0.3
Eosinophils Relative: 2
Lymphocytes Relative: 18
Lymphs Abs: 1.6
Lymphs Abs: 3.4
Lymphs Abs: 4.5 — ABNORMAL HIGH
Monocytes Absolute: 0.8
Monocytes Relative: 10
Monocytes Relative: 8
Neutrophils Relative %: 32 — ABNORMAL LOW
Neutrophils Relative %: 51
Neutrophils Relative %: 75

## 2010-10-22 LAB — MAGNESIUM
Magnesium: 1.8
Magnesium: 2
Magnesium: 2

## 2010-10-22 LAB — CHOLESTEROL, TOTAL: Cholesterol: 158

## 2010-10-22 LAB — PHOSPHORUS
Phosphorus: 2.7
Phosphorus: 3.5
Phosphorus: 4.4

## 2010-10-22 LAB — PREALBUMIN
Prealbumin: 11.4 — ABNORMAL LOW
Prealbumin: 12.5 — ABNORMAL LOW
Prealbumin: 2.8 — ABNORMAL LOW

## 2010-10-24 LAB — DIFFERENTIAL
Basophils Absolute: 0.1
Eosinophils Relative: 4
Lymphocytes Relative: 34
Lymphs Abs: 3.1
Monocytes Absolute: 0.6
Neutro Abs: 5

## 2010-10-24 LAB — CBC
MCHC: 32.7
MCV: 81.4
Platelets: 288
RBC: 4.71
RDW: 16.6 — ABNORMAL HIGH

## 2010-10-24 LAB — COMPREHENSIVE METABOLIC PANEL
AST: 25
Albumin: 3.5
BUN: 23
Calcium: 9.4
Chloride: 105
Creatinine, Ser: 1.09
GFR calc Af Amer: >60
GFR calc non Af Amer: 50 — ABNORMAL LOW
Total Bilirubin: 0.7

## 2010-10-24 LAB — CEA: CEA: 2.3

## 2010-11-05 ENCOUNTER — Other Ambulatory Visit (HOSPITAL_COMMUNITY): Payer: Self-pay | Admitting: Oncology

## 2010-11-05 ENCOUNTER — Telehealth (HOSPITAL_COMMUNITY): Payer: Self-pay

## 2010-11-05 DIAGNOSIS — Z139 Encounter for screening, unspecified: Secondary | ICD-10-CM

## 2010-11-05 LAB — CBC
HCT: 34.5 — ABNORMAL LOW
Hemoglobin: 11.7 — ABNORMAL LOW
WBC: 5.9

## 2010-11-05 LAB — DIFFERENTIAL
Eosinophils Relative: 8 — ABNORMAL HIGH
Lymphocytes Relative: 36
Lymphs Abs: 2.1
Monocytes Absolute: 0.4
Monocytes Relative: 6

## 2010-11-05 NOTE — Telephone Encounter (Signed)
Patient notified Bone Density scheduled for 09/07/11 @ 11:00am.

## 2010-11-05 NOTE — Telephone Encounter (Signed)
Message copied by Sterling Big on Wed Nov 05, 2010  2:28 PM ------      Message from: Tobie Lords S      Created: Wed Jul 30, 2010  2:56 PM       Reschedule bone density for 08/2011.

## 2010-12-02 ENCOUNTER — Ambulatory Visit (INDEPENDENT_AMBULATORY_CARE_PROVIDER_SITE_OTHER): Payer: BC Managed Care – PPO | Admitting: Internal Medicine

## 2011-01-05 ENCOUNTER — Ambulatory Visit (INDEPENDENT_AMBULATORY_CARE_PROVIDER_SITE_OTHER): Payer: BC Managed Care – PPO | Admitting: Internal Medicine

## 2011-01-05 ENCOUNTER — Encounter (INDEPENDENT_AMBULATORY_CARE_PROVIDER_SITE_OTHER): Payer: Self-pay | Admitting: Internal Medicine

## 2011-01-05 DIAGNOSIS — S31109A Unspecified open wound of abdominal wall, unspecified quadrant without penetration into peritoneal cavity, initial encounter: Secondary | ICD-10-CM

## 2011-01-05 DIAGNOSIS — Z86711 Personal history of pulmonary embolism: Secondary | ICD-10-CM

## 2011-01-05 DIAGNOSIS — E785 Hyperlipidemia, unspecified: Secondary | ICD-10-CM

## 2011-01-05 DIAGNOSIS — E039 Hypothyroidism, unspecified: Secondary | ICD-10-CM

## 2011-01-05 DIAGNOSIS — I10 Essential (primary) hypertension: Secondary | ICD-10-CM

## 2011-01-05 DIAGNOSIS — Z85038 Personal history of other malignant neoplasm of large intestine: Secondary | ICD-10-CM

## 2011-01-05 DIAGNOSIS — I739 Peripheral vascular disease, unspecified: Secondary | ICD-10-CM

## 2011-01-05 DIAGNOSIS — F17200 Nicotine dependence, unspecified, uncomplicated: Secondary | ICD-10-CM | POA: Insufficient documentation

## 2011-01-05 DIAGNOSIS — M810 Age-related osteoporosis without current pathological fracture: Secondary | ICD-10-CM | POA: Insufficient documentation

## 2011-01-05 DIAGNOSIS — Z87891 Personal history of nicotine dependence: Secondary | ICD-10-CM

## 2011-01-05 DIAGNOSIS — R252 Cramp and spasm: Secondary | ICD-10-CM

## 2011-01-05 DIAGNOSIS — E119 Type 2 diabetes mellitus without complications: Secondary | ICD-10-CM | POA: Insufficient documentation

## 2011-01-05 NOTE — Progress Notes (Signed)
Presenting complaint; Followup for multiple problems. Subjective: Heather [name] is 70 year old Caucasian female who is here for scheduled visit. She was last seen in April this year. She has no specific complaints. She says her FBS runs between 60 and 90. She says random blood glucose level is rarely above 200. She has good appetite. She denies diarrhea constipation melena or rectal bleeding dysuria or hematuria. She continues to smoke cigarettes but less than pack a day. She is not interested in any treatment options at this time. She denies shortness of breath chest pain or cough. She complains of intermittent generalized cramps which may last for 15-20 minutes pre-she can walk for 15 minutes on a flat surface before her legs give out. She has diffuse followup with vascular surgeon whom he is seen in the past. She is still having scant drainage from abdominal wound. She had surgery for colocutaneous fistula when she had takedown of her colostomy. She has not seen Dr. Johna Sheriff  in several months. She says her brother had surgery for colorectal carcinoma and is 72 years old. Current Medications: Current Outpatient Prescriptions  Medication Sig Dispense Refill  . acetaminophen (TYLENOL) 100 MG/ML solution Take 10 mg/kg by mouth every 4 (four) hours as needed.        . Alendronate Sodium (FOSAMAX PO) Take by mouth. Pt takes one dose weekly       . calcium carbonate (OS-CAL) 600 MG TABS Take 600 mg by mouth daily.        . Cholecalciferol (D3 ADULT PO) Take 1,000 Units by mouth 1 day or 1 dose.       . cloNIDine (CATAPRES) 0.1 MG tablet Take 0.1 mg by mouth 2 (two) times daily.        . diphenhydrAMINE (BENADRYL) 25 MG tablet Take 25 mg by mouth every 6 (six) hours as needed. Takes as needed       . diphenhydramine-acetaminophen (TYLENOL PM) 25-500 MG TABS Take 1 tablet by mouth as needed.        . enalapril (VASOTEC) 10 MG tablet Take 10 mg by mouth daily.        Marland Kitchen ezetimibe (ZETIA) 10 MG tablet Take 10 mg by  mouth daily.        . FUROSEMIDE PO Take by mouth as needed. No dose noted  Pt states prn       . insulin glargine (LANTUS) 100 UNIT/ML injection Inject 30 Units into the skin at bedtime.       Marland Kitchen levothyroxine (SYNTHROID, LEVOTHROID) 112 MCG tablet Take 112 mcg by mouth daily.        . metoprolol (LOPRESSOR) 50 MG tablet Take 50 mg by mouth 2 (two) times daily.        . Multiple Vitamin (MULTIVITAMIN) tablet Take 1 tablet by mouth daily.        . potassium chloride (KLOR-CON) 20 MEQ packet Take 20 mEq by mouth 2 (two) times daily. pt is taking prn       . warfarin (COUMADIN) 3 MG tablet Take 3 mg by mouth daily.         Objective: BP 120/78  Pulse 76  Temp(Src) 98.1 F (36.7 C) (Oral)  Resp 16  Ht 5\' 1"  (1.549 m)  Wt 157 lb (71.215 kg)  BMI 29.67 kg/m2 Conjunctiva is pink. Sclera is nonicteric Oral pharyngeal mucosa is normal. No neck masses or thyromegaly noted. Cardiac exam with regular rhythm normal S1 and S2. No murmur or gallop noted. Lungs are clear  to auscultation. Abdominal exam reveals a small wound mid abdomen with scant discharge on dressing. Funnel shape configuration with the wound at the bottom. No organomegaly masses or tenderness noted. No LE edema or clubbing noted.  Assessment: #1. Diabetes mellitus. She is currently on Lantus and ice cream measures. Her control has been suboptimal. Hemoglobin A1c was 7.7 in April 2012. #2. History of colon carcinoma; status post left, colectomy with colostomy in 2007 followed by adjuvant chemotherapy and she has remained in remission. She had takedown of her colostomy and repair of ventral hernia in July 2009 still has open wound. Will plan surveillance colonoscopy next year. #3. History of PE. #4. Hypothyroidism. #5. Peripheral vascular disease. Is been reluctant to followup with vascular surgeon. #6. Cigarette smoking. Patient not interested in any treatment said intervention. #7. Intermittent generalized muscle  cramps.    Plan: She will go to the lab for serum magnesium, hemoglobin A1c level, INR, TSH, lipid  profile, CEA and CMET. Patient advised to make an appointment to see Dr. Jaclynn Guarneri after the holidays regarding  nonhealing abdominal wound. Will plan colonoscopy next year. Office visit in 4 months.

## 2011-01-05 NOTE — Patient Instructions (Addendum)
Physician will contact you with results of blood tests. Please make an appointment with Dr. Jaclynn Guarneri  after the holidays regarding  open abdominal wound.

## 2011-01-06 LAB — HEMOGLOBIN A1C
Hgb A1c MFr Bld: 8.3 % — ABNORMAL HIGH (ref <5.7)
Mean Plasma Glucose: 192 mg/dL — ABNORMAL HIGH (ref <117)

## 2011-01-06 LAB — CBC WITH DIFFERENTIAL/PLATELET
Basophils Relative: 1 % (ref 0–1)
Eosinophils Absolute: 0.3 10*3/uL (ref 0.0–0.7)
HCT: 39.6 % (ref 36.0–46.0)
Hemoglobin: 12.1 g/dL (ref 12.0–15.0)
MCH: 27.7 pg (ref 26.0–34.0)
MCHC: 30.6 g/dL (ref 30.0–36.0)
Monocytes Absolute: 0.6 10*3/uL (ref 0.1–1.0)
Monocytes Relative: 6 % (ref 3–12)

## 2011-01-06 LAB — LIPID PANEL
Cholesterol: 191 mg/dL (ref 0–200)
HDL: 38 mg/dL — ABNORMAL LOW (ref >39)
LDL Cholesterol: 96 mg/dL (ref 0–99)
Triglycerides: 286 mg/dL — ABNORMAL HIGH (ref <150)

## 2011-01-06 LAB — COMPREHENSIVE METABOLIC PANEL
Albumin: 3.9 g/dL (ref 3.5–5.2)
Alkaline Phosphatase: 100 U/L (ref 39–117)
BUN: 23 mg/dL (ref 6–23)
Glucose, Bld: 120 mg/dL — ABNORMAL HIGH (ref 70–99)
Potassium: 5 mEq/L (ref 3.5–5.3)
Total Bilirubin: 0.3 mg/dL (ref 0.3–1.2)

## 2011-01-06 LAB — PROTIME-INR: INR: 2.16 — ABNORMAL HIGH (ref <1.50)

## 2011-01-06 LAB — MAGNESIUM: Magnesium: 1.9 mg/dL (ref 1.5–2.5)

## 2011-01-06 MED ORDER — INSULIN LISPRO 100 UNIT/ML ~~LOC~~ SOLN: 4.0000 [IU] | Freq: Three times a day (TID) | SUBCUTANEOUS | Status: DC

## 2011-01-07 ENCOUNTER — Other Ambulatory Visit (INDEPENDENT_AMBULATORY_CARE_PROVIDER_SITE_OTHER): Payer: Self-pay | Admitting: Internal Medicine

## 2011-01-07 DIAGNOSIS — I739 Peripheral vascular disease, unspecified: Secondary | ICD-10-CM

## 2011-01-07 DIAGNOSIS — E119 Type 2 diabetes mellitus without complications: Secondary | ICD-10-CM

## 2011-01-07 DIAGNOSIS — Z86711 Personal history of pulmonary embolism: Secondary | ICD-10-CM

## 2011-01-07 NOTE — Telephone Encounter (Signed)
Per Dr. Karilyn Cota the patient will need INR in 4 weeks.

## 2011-01-15 ENCOUNTER — Encounter (INDEPENDENT_AMBULATORY_CARE_PROVIDER_SITE_OTHER): Payer: Self-pay | Admitting: *Deleted

## 2011-01-23 ENCOUNTER — Telehealth (INDEPENDENT_AMBULATORY_CARE_PROVIDER_SITE_OTHER): Payer: Self-pay

## 2011-01-23 NOTE — Telephone Encounter (Signed)
Returned Ms. Strassner's phone call, left voice message to call our office.

## 2011-01-23 NOTE — Telephone Encounter (Signed)
Pt called c/o chronic and increased drainage in the umbilical area.  She said the area stays irritated, and has been this way since surgery.  She is concerned because the drainage has increased.  Please advise if she needs to be seen.

## 2011-01-27 ENCOUNTER — Ambulatory Visit (INDEPENDENT_AMBULATORY_CARE_PROVIDER_SITE_OTHER): Payer: BC Managed Care – PPO | Admitting: General Surgery

## 2011-01-27 ENCOUNTER — Encounter (INDEPENDENT_AMBULATORY_CARE_PROVIDER_SITE_OTHER): Payer: Self-pay | Admitting: General Surgery

## 2011-01-27 VITALS — BP 152/70 | HR 63 | Temp 97.2°F | Ht 60.0 in | Wt 156.2 lb

## 2011-01-27 DIAGNOSIS — K632 Fistula of intestine: Secondary | ICD-10-CM

## 2011-01-27 NOTE — Progress Notes (Signed)
History: Patient returns to the office. She has a history of a small colocutaneous fistula to her midline wound following takedown of a colostomy and ventral hernia repair by me in 2009. This has had minimal drainage requiring just one 4 x 4 gauze per day. She did have one previous abscess at the site in 2011 that required drainage. She had a lot of difficulty following her original surgery and we both felt that she would not be a candidate for any further surgery.  She comes in today because she's noted some increased volume of the drainage for a couple of months. It is fairly watery and pink tinged. She changes the bandage about 3 times a day and smokes a half of one 4 x 4. She has not had any fever, chills, abdominal pain, or trouble with her bowel movements.  Exam: Gen.: Elderly somewhat frail Caucasian female in no acute distress Abdomen: Generally soft and nontender. There is a depression in her lower midline wound at the base of this and essentially pinpoint opening with some watery cloudy slightly blood-tinged fluid expressed with pressure appeared  A hemostat gently opened this area into a approximately 1 cm deep and wide subcutaneous pocket. This was cauterized and cleaned and repacked with gauze.  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 There has been some increased drainage I suspect this was due to a small pocket that had developed. This is been opened and drained and cauterized. We will see if this helps for drainage. I will check her back in one month.

## 2011-01-29 ENCOUNTER — Other Ambulatory Visit (HOSPITAL_COMMUNITY): Payer: BC Managed Care – PPO

## 2011-01-30 ENCOUNTER — Ambulatory Visit (HOSPITAL_COMMUNITY): Payer: BC Managed Care – PPO | Admitting: Oncology

## 2011-02-02 ENCOUNTER — Encounter (HOSPITAL_COMMUNITY): Payer: BC Managed Care – PPO | Attending: Oncology

## 2011-02-02 DIAGNOSIS — C189 Malignant neoplasm of colon, unspecified: Secondary | ICD-10-CM | POA: Insufficient documentation

## 2011-02-02 LAB — COMPREHENSIVE METABOLIC PANEL
ALT: 15 U/L (ref 0–35)
AST: 18 U/L (ref 0–37)
Albumin: 3.1 g/dL — ABNORMAL LOW (ref 3.5–5.2)
Alkaline Phosphatase: 113 U/L (ref 39–117)
Calcium: 9.8 mg/dL (ref 8.4–10.5)
Potassium: 4 mEq/L (ref 3.5–5.1)
Sodium: 138 mEq/L (ref 135–145)
Total Protein: 7.7 g/dL (ref 6.0–8.3)

## 2011-02-02 LAB — DIFFERENTIAL
Eosinophils Absolute: 0.2 10*3/uL (ref 0.0–0.7)
Eosinophils Relative: 2 % (ref 0–5)
Lymphocytes Relative: 40 % (ref 12–46)
Lymphs Abs: 3.8 10*3/uL (ref 0.7–4.0)
Monocytes Relative: 5 % (ref 3–12)

## 2011-02-02 LAB — CBC
Hemoglobin: 11.5 g/dL — ABNORMAL LOW (ref 12.0–15.0)
MCHC: 31.1 g/dL (ref 30.0–36.0)
Platelets: 335 10*3/uL (ref 150–400)
RDW: 16.6 % — ABNORMAL HIGH (ref 11.5–15.5)

## 2011-02-02 NOTE — Progress Notes (Signed)
Heather [name] presented for labwork. Labs per MD order drawn via Peripheral Line 23 gauge needle inserted in right upper forearm  Good blood return present. Procedure without incident.  Needle removed intact. Patient tolerated procedure well.

## 2011-02-03 ENCOUNTER — Encounter (HOSPITAL_BASED_OUTPATIENT_CLINIC_OR_DEPARTMENT_OTHER): Payer: BC Managed Care – PPO | Admitting: Oncology

## 2011-02-03 ENCOUNTER — Encounter (HOSPITAL_COMMUNITY): Payer: Self-pay | Admitting: Oncology

## 2011-02-03 DIAGNOSIS — C184 Malignant neoplasm of transverse colon: Secondary | ICD-10-CM

## 2011-02-03 DIAGNOSIS — C189 Malignant neoplasm of colon, unspecified: Secondary | ICD-10-CM

## 2011-02-03 DIAGNOSIS — M810 Age-related osteoporosis without current pathological fracture: Secondary | ICD-10-CM

## 2011-02-03 DIAGNOSIS — C779 Secondary and unspecified malignant neoplasm of lymph node, unspecified: Secondary | ICD-10-CM

## 2011-02-03 DIAGNOSIS — R609 Edema, unspecified: Secondary | ICD-10-CM

## 2011-02-03 LAB — CEA: CEA: 3 ng/mL (ref 0.0–5.0)

## 2011-02-03 NOTE — Progress Notes (Signed)
Heather Hippo, MD, MD [address] [place] [address]  1. Osteoporosis  Calcium Carbonate-Vit D-Min 600-200 MG-UNIT TABS, Famotidine (PEPCID AC PO), DG Bone Density, DG Bone Density Peripheral Skeleton  2. Colon cancer  CBC, Differential, CEA, Comprehensive metabolic panel    CURRENT THERAPY:S/P 6 cycles of oxaliplatin and capecitabine postoperatively following surgery on 10/19/2005.   INTERVAL HISTORY: Heather [name] 71 y.o. female returns for  regular  visit for followup of Stage III colon cancer with 1/10 positive lymph nodes, intermediate grade, 4 cm in size s/p chemotherapy and resection.   The patient recently moved to a new house.    The patient's only complaint is muscle cramping.  She reports that it occurs at random times and resolves on its own.  Her lab work does not reveal hypokalemia.  I have suggested she increase her fluid intake.  She admits that she is consuming 4-5 glasses of H2O daily.  I have asked her to increase this to 8-10 glasses.    Otherwise, the patient denies any complaints. I personally reviewed and went over laboratory results with the patient. Her CEA is stable.  The patient recently saw Dr. 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  She will return to him for follow-up next month.   On PE I noted B/L LE edema.  This is likely dependent edema.  Patient education regarding LE edema and the role of conservative treatment including elevation of LE.  Past Medical History  Diagnosis Date  . Diabetes mellitus   . Hypertension   . Thyroid disease   . Colon cancer   . COPD (chronic obstructive pulmonary disease)   . MRSA infection   . Pulmonary embolus   . Deep vein thrombosis   . Pneumonia   . Rheumatic fever   . Dehydration   . MRSA (methicillin resistant staph aureus) culture positive   . Arthritis   . History of shingles 02/2010    has Colon cancer; History of pulmonary embolus (PE);  Diabetes mellitus; Hypertension; Hypothyroidism; Hyperlipidemia; Peripheral vascular disease; Open wound of abdominal wall without complication; Osteoporosis; Smoking; Smoking hx; Cramps, muscle, general; and Colocutaneous fistula on her problem list.     is allergic to motrin; sulfa drugs cross reactors; and iohexol.  Ms. Hossain does not currently have medications on file.  Past Surgical History  Procedure Date  . Back surgery   . Partial hysterectomy   . Cholecystectomy   . Colostomy takedown   . Arthroscopic repair acl   . Hernia repair   . Breast cyst excision   . Gallbladder surgery   . Back surgery   . Bile duct exploration   . Bladder tact     '70s  . Cystitis   . Port-a-cath removal   . Abcess drainage     abdominal wall    Denies any headaches, dizziness, double vision, fevers, chills, night sweats, nausea, vomiting, diarrhea, constipation, chest pain, heart palpitations, shortness of breath, blood in stool, black tarry stool, urinary pain, urinary burning, urinary frequency, hematuria.   PHYSICAL EXAMINATION  ECOG PERFORMANCE STATUS: 0 - Asymptomatic  Filed Vitals:   02/03/11 1500  BP: 114/68  Pulse: 65  Temp: 97.6 F (36.4 C)    GENERAL:alert, no distress, well nourished, well developed, comfortable, cooperative, obese and smiling SKIN: skin color, texture, turgor are normal, no rashes or significant lesions HEAD: Normocephalic, No masses, lesions, tenderness or abnormalities EYES: normal EARS: External ears normal OROPHARYNX:mucous  membranes are moist  NECK: supple, no adenopathy, no bruits, thyroid normal size, non-tender, without nodularity, no stridor, non-tender, trachea midline LYMPH:  no palpable lymphadenopathy, no hepatosplenomegaly BREAST:not examined LUNGS: clear to auscultation and percussion HEART: regular rate & rhythm, no murmurs, no gallops, S1 normal and S2 normal ABDOMEN:abdomen soft, non-tender, obese, normal bowel sounds, no masses or  organomegaly and no hepatosplenomegaly.  Clean gauze noted in umbilicus.  BACK: Back symmetric, no curvature., No CVA tenderness EXTREMITIES:less then 2 second capillary refill, no joint deformities, effusion, or inflammation, no skin discoloration, no clubbing, no cyanosis, positive findings:  edema B/L 1+ pitting edema in LE  NEURO: alert & oriented x 3 with fluent speech, no focal motor/sensory deficits, gait normal   LABORATORY DATA: CBC    Component Value Date/Time   WBC 9.4 02/02/2011 1552   RBC 4.18 02/02/2011 1552   HGB 11.5* 02/02/2011 1552   HCT 37.0 02/02/2011 1552   PLT 335 02/02/2011 1552   MCV 88.5 02/02/2011 1552   MCH 27.5 02/02/2011 1552   MCHC 31.1 02/02/2011 1552   RDW 16.6* 02/02/2011 1552   LYMPHSABS 3.8 02/02/2011 1552   MONOABS 0.5 02/02/2011 1552   EOSABS 0.2 02/02/2011 1552   BASOSABS 0.1 02/02/2011 1552      Chemistry      Component Value Date/Time   NA 138 02/02/2011 1552   K 4.0 02/02/2011 1552   CL 102 02/02/2011 1552   CO2 29 02/02/2011 1552   BUN 19 02/02/2011 1552   CREATININE 1.25* 02/02/2011 1552   CREATININE 1.28* 01/05/2011 1632      Component Value Date/Time   CALCIUM 9.8 02/02/2011 1552   ALKPHOS 113 02/02/2011 1552   AST 18 02/02/2011 1552   ALT 15 02/02/2011 1552   BILITOT 0.2* 02/02/2011 1552     Lab Results  Component Value Date   CEA 3.0 02/02/2011      PATHOLOGY: 10/19/2005  REPORT OF SURGICAL PATHOLOGY  Case #: [id number] Patient Name: TSION, INGHRAM Office Chart Number: N/A  MRN: [id number] Pathologist: Alden Server A. Delila Spence, MD DOB/Age [birth year] (Age: 14) Gender: F Date Taken: 10/19/2005 Date Received: 10/20/2005  FINAL DIAGNOSIS  MICROSCOPIC EXAMINATION AND DIAGNOSIS  1. COLON, PROXIMAL TRANSVERSE, RESECTION: - SEGMENT OF BENIGN COLON. - ONE BENIGN LYMPH NODE. - MARGINS FREE OF TUMOR  2. COLON, LEFT, SEGMENTAL RESECTION: - INVASIVE COLONIC ADENOCARCINOMA, MODERATELY DIFFERENTIATED. - TUMOR IS INVASIVE INTO PERICOLONIC  ADIPOSE TISSUE (T3). - PROXIMAL AND DISTAL MARGINS OF RESECTION FREE OF TUMOR. - DIVERTICULOSIS. - ONE OF TEN PERICOLONIC LYMPH NODES WITH METASTATIC ADENOCARCINOMA.  3. APPENDIX, APPENDECTOMY: NO PATHOLOGIC DIAGNOSIS.  COMMENT ONCOLOGY TABLE- COLON AND RECTUM  1. Maximum tumor size (cm): 4.0 cm 2. Histology: Adenocarcinoma 3. Grade: G2 4. Margins: proximal-free; distal-free; radial-free Distance of invasive carcinoma from nearest margin: 0.5 cm, radial margin 5. Perforation of visceral peritoneum: No 6. Depth of invasion: Tumor is invasive into pericolonic adipose tissue 7. Vascular/lymphatic invasion: Present 8. Lymph nodes: # examined 10; # positive 1 9. TNM code: T3, N1, MX 10. Comments: Specimen was reexamined for additional pericolonic lymph nodes and none were found. One of ten lymph nodes shows metastatic adenocarcinoma. (EA:mj 10/23/05)  mj Date Reported: 10/23/2005 Alden Server A. Delila Spence, MD  Electronically Signed Out By EAA       ASSESSMENT:  1. Stage III Colon cancer S/P chemotherapy and resection on 10/19/05  2. Abdominal wall abscess drained by Dr. Jimmye Norman on 09/16/09 still with an open area.  3. Fatigue  4. History of falling, without injuries. Using a cane for balance, declines a walker today.  5. Muscle cramping, unknown etiology 6. Chronic colocutaneous fistula, followed by Dr. Johna Sheriff   PLAN: 1. Lab work every three months: CBC diff, CEA   2. Lab work every 6 months: CMET 3. Bone density in August 2013 4. I personally reviewed and went over laboratory results with the patient. 5. Patient education regarding elevation of LE above the level of the umbilicus to reduce LE edema.   Patient declined compression stockings.  6. Return in 6 months for follow-up.  All questions were answered. The patient knows to call the clinic with any problems, questions or concerns. We can certainly see the patient much sooner if necessary.  The patient and plan discussed  with Glenford Peers, MD and he is in agreement with the aforementioned.  I spent 20 minutes counseling the patient face to face. The total time spent in the appointment was 30 minutes.  KEFALAS,THOMAS

## 2011-02-03 NOTE — Patient Instructions (Signed)
Heather [name]  [id number] 05-20-1940   [facility]  Discharge Instructions  RECOMMENDATIONS MADE BY THE CONSULTANT AND ANY TEST RESULTS WILL BE SENT TO YOUR REFERRING DOCTOR.   EXAM FINDINGS BY MD TODAY AND SIGNS AND SYMPTOMS TO REPORT TO CLINIC OR PRIMARY MD: You are doing well.  Your tumor marker is normal.  We will continue to check it every 3 months.    MEDICATIONS PRESCRIBED: none   INSTRUCTIONS GIVEN AND DISCUSSED: Other :  Report changes in bowel habits, blood in stool, etc.  SPECIAL INSTRUCTIONS/FOLLOW-UP: Lab work Needed every 3 months, Xray Studies Needed:  Bone density in 12-Oct-2022 and Return to Clinic in 6 months to see Dr. Mariel Sleet.   I acknowledge that I have been informed and understand all the instructions given to me and received a copy. I do not have any more questions at this time, but understand that I may call the Specialty Clinic at [facility] at [phone] during business hours should I have any further questions or need assistance in obtaining follow-up care.    __________________________________________  _____________  __________ Signature of Patient or Authorized Representative            Date                   Time    __________________________________________ Nurse's Signature

## 2011-02-03 NOTE — Progress Notes (Deleted)
Malissa Hippo, MD, MD [address] [place] [address]  1. Osteoporosis  Calcium Carbonate-Vit D-Min 600-200 MG-UNIT TABS, Famotidine (PEPCID AC PO), DG Bone Density, DG Bone Density Peripheral Skeleton  2. Colon cancer  CBC, Differential, CEA, Comprehensive metabolic panel    CURRENT THERAPY:  INTERVAL HISTORY: Ivette Loyal 71 y.o. female returns for *** regular *** visit for followup of ***   Cramps   Past Medical History  Diagnosis Date  . Diabetes mellitus   . Hypertension   . Thyroid disease   . Colon cancer   . COPD (chronic obstructive pulmonary disease)   . MRSA infection   . Pulmonary embolus   . Deep vein thrombosis   . Pneumonia   . Rheumatic fever   . Dehydration   . MRSA (methicillin resistant staph aureus) culture positive   . Arthritis   . History of shingles 02/2010    has Colon cancer; History of pulmonary embolus (PE); Diabetes mellitus; Hypertension; Hypothyroidism; Hyperlipidemia; Peripheral vascular disease; Open wound of abdominal wall without complication; Osteoporosis; Smoking; Smoking hx; Cramps, muscle, general; and Colocutaneous fistula on her problem list.     is allergic to motrin; sulfa drugs cross reactors; and iohexol.  Ms. Birkhead does not currently have medications on file.  Past Surgical History  Procedure Date  . Back surgery   . Partial hysterectomy   . Cholecystectomy   . Colostomy takedown   . Arthroscopic repair acl   . Hernia repair   . Breast cyst excision   . Gallbladder surgery   . Back surgery   . Bile duct exploration   . Bladder tact     '70s  . Cystitis   . Port-a-cath removal   . Abcess drainage     abdominal wall    *** denies any headaches, dizziness, double vision, fevers, chills, night sweats, nausea, vomiting, diarrhea, constipation, chest pain, heart palpitations, shortness of breath, blood in stool, black tarry stool, urinary pain, urinary burning, urinary frequency, hematuria.     PHYSICAL EXAMINATION  ECOG PERFORMANCE STATUS: {CHL ONC ECOG PS:[phone]}  Filed Vitals:   02/03/11 1500  BP: 114/68  Pulse: 65  Temp: 97.6 F (36.4 C)    GENERAL:{CHL ONC PE GENERAL:[phone]} SKIN: {CHL ONC PE XBJY:[id number]} HEAD: {CHL ONC PE QMVH:8469629528} EYES: {CHL ONC PE UXLK:4401027253} EARS: {CHL ONC PE GUYQ:0347425956} OROPHARYNX:{CHL ONC PE OROPHARYNX:[phone]}  NECK: {CHL ONC PE LOVF:6433295188} LYMPH:  {CHL ONC PE CZYSA:6301601093} BREAST:{CHL ONC PE BREAST:[phone]} LUNGS: {CHL ONC PE ATFTD:3220254270} HEART: {CHL ONC PE WCBJS:2831517616} ABDOMEN:{CHL ONC PE ABDOMEN:[phone]} BACK: {CHL ONC PE WVPX:1062694854} EXTREMITIES:{CHL ONC PE EXTREMITIES:[phone]}  NEURO: {CHL ONC PE NEURO:6501526142} PELVIC:{CHL ONC PE [phone]} RECTAL: {CHL ONC PE [phone]}    LABORATORY DATA:  {*** HELP TEXT ***  This SmartLink requires parameters for processing. Parameters are variables that can be added to the original SmartLink name. This allows the user to request specific information by giving the SmartLink precise direction.  The LabLast SmartLink accepts a list of result component base names separated by commas. The user can also request the number of results to display for each component. To indicate the number of results for each component, type the component name followed by a colon and then the number of results. (Component name:4). For all results for that particular component, type a star in place of the number (Component name:*). To obtain the last result for a particular component, type the component base name without the colon, number, or star. Ex: .LabLast[RBC.  Depending on how the SmartLink is setup, results may be limited by a lookback period. In this case, any results older than the lookback period will not be displayed.  The SmartLink will display the name of the component(s) with the units used next to the name followed by a table  listing the date and the result value for each result requested.  Example: .LabLast[MCH:3,MCV:*,HCT   This example would display a table for all components whose base name matches '[facility]', 'MCV', or 'HCT'. The first would contain the last three results of each component that has a base name of [facility], the second would contain all the entered results for components with MCV as the base name, and the third would contain the last entered result for all components with a base name of HCT.}   PENDING LABS:   RADIOGRAPHIC STUDIES:  No results found.   PATHOLOGY:    ASSESSMENT: ***   PLAN: ***   All questions were answered. The patient knows to call the clinic with any problems, questions or concerns. We can certainly see the patient much sooner if necessary.  The patient and plan discussed with {CHL ONC MEDICAL ONCOLOGISTS:[phone]} and he is in agreement with the aforementioned.  I spent {CHL ONC TIME VISIT - IONGE:9528413244} counseling the patient face to face. The total time spent in the appointment was {CHL ONC TIME VISIT - WNUUV:[phone]}.  Kollyns Mickelson

## 2011-02-13 ENCOUNTER — Other Ambulatory Visit (INDEPENDENT_AMBULATORY_CARE_PROVIDER_SITE_OTHER): Payer: Self-pay | Admitting: Internal Medicine

## 2011-02-22 ENCOUNTER — Other Ambulatory Visit (HOSPITAL_COMMUNITY): Payer: Self-pay | Admitting: Oncology

## 2011-03-11 ENCOUNTER — Encounter (INDEPENDENT_AMBULATORY_CARE_PROVIDER_SITE_OTHER): Payer: BC Managed Care – PPO | Admitting: General Surgery

## 2011-03-18 ENCOUNTER — Other Ambulatory Visit (INDEPENDENT_AMBULATORY_CARE_PROVIDER_SITE_OTHER): Payer: Self-pay | Admitting: Internal Medicine

## 2011-04-16 ENCOUNTER — Other Ambulatory Visit (INDEPENDENT_AMBULATORY_CARE_PROVIDER_SITE_OTHER): Payer: Self-pay | Admitting: Internal Medicine

## 2011-04-21 ENCOUNTER — Other Ambulatory Visit (INDEPENDENT_AMBULATORY_CARE_PROVIDER_SITE_OTHER): Payer: Self-pay | Admitting: Internal Medicine

## 2011-04-23 ENCOUNTER — Ambulatory Visit (INDEPENDENT_AMBULATORY_CARE_PROVIDER_SITE_OTHER): Payer: BC Managed Care – PPO | Admitting: General Surgery

## 2011-04-23 ENCOUNTER — Encounter (INDEPENDENT_AMBULATORY_CARE_PROVIDER_SITE_OTHER): Payer: Self-pay | Admitting: General Surgery

## 2011-04-23 VITALS — BP 152/88 | HR 72 | Temp 97.6°F | Resp 16 | Ht 60.0 in | Wt 155.6 lb

## 2011-04-23 DIAGNOSIS — K632 Fistula of intestine: Secondary | ICD-10-CM

## 2011-04-23 NOTE — Progress Notes (Signed)
Chief complaint: Followup fistula  History: Patient returns for followup for chronic colocutaneous fistula with history of colostomy takedown after remote colectomy for obstructing cancer. She's had some increased drainage in recent months. This is not feculent but is cloudy and pink and she changes a 4 x 4 about 3 times a day. No specific abdominal pain or fever. She does complain of generalized cramping in her flanks and arms and legs over the last couple of months.  Exam: BP 152/88  Pulse 72  Temp(Src) 97.6 F (36.4 C) (Temporal)  Resp 16  Ht 5' (1.524 m)  Wt 155 lb 9.6 oz (70.58 kg)  BMI 30.39 kg/m2  Gen.: Elderly female in no acute distress Abdomen: Small skin opening in her lower midline wound with a slight cloudy non-feculent drainage and some mild irritation of the surrounding skin. Abdomen is generally nontender without mass or organomegaly appreciated.  Assessment and plan: Chronic colocutaneous fistula post colostomy takedown. She has some increase in drainage spell see any evidence of cellulitis or abscess. She still feels this is much preferable to her colostomy. I don't see anything different to do now Surgery would be very difficult and risky. We discussed wound care strategies now check her back in about 3 months. Heyer-Schulte see her primary care physician about her generalized cramping

## 2011-04-28 ENCOUNTER — Encounter (HOSPITAL_COMMUNITY): Payer: BC Managed Care – PPO | Attending: Oncology

## 2011-04-28 DIAGNOSIS — C189 Malignant neoplasm of colon, unspecified: Secondary | ICD-10-CM | POA: Insufficient documentation

## 2011-04-28 LAB — CBC
HCT: 40.5 % (ref 36.0–46.0)
MCH: 26.9 pg (ref 26.0–34.0)
MCV: 88.4 fL (ref 78.0–100.0)
Platelets: 247 10*3/uL (ref 150–400)
RDW: 15.8 % — ABNORMAL HIGH (ref 11.5–15.5)

## 2011-04-28 NOTE — Progress Notes (Signed)
Labs drawn today for cbc,cea 

## 2011-05-05 ENCOUNTER — Encounter (INDEPENDENT_AMBULATORY_CARE_PROVIDER_SITE_OTHER): Payer: Self-pay | Admitting: *Deleted

## 2011-05-18 ENCOUNTER — Ambulatory Visit (INDEPENDENT_AMBULATORY_CARE_PROVIDER_SITE_OTHER): Payer: BC Managed Care – PPO | Admitting: Internal Medicine

## 2011-05-18 ENCOUNTER — Encounter (INDEPENDENT_AMBULATORY_CARE_PROVIDER_SITE_OTHER): Payer: Self-pay | Admitting: Internal Medicine

## 2011-05-18 VITALS — BP 148/62 | HR 66 | Temp 98.5°F | Ht 60.0 in | Wt 155.4 lb

## 2011-05-18 DIAGNOSIS — C189 Malignant neoplasm of colon, unspecified: Secondary | ICD-10-CM

## 2011-05-18 DIAGNOSIS — E119 Type 2 diabetes mellitus without complications: Secondary | ICD-10-CM

## 2011-05-18 NOTE — Patient Instructions (Signed)
Discharge Instructions Browse by Alphabet A B C D E F G H I J K L M N O P Q R S T U V W X Y Z  Browse by Category All Documents Allergy and Immunology Anesthesiology Bariatrics Bioterrorism Cardiology Critical Care Dentistry Dermatology Diabetes Dietary Easy-to-Read Emergency Medicine Endocrinology ENT Family Medicine Forms Gastroenterology Geriatrics Infectious Disease Internal Medicine Labs and Tests Neonatology Nephrology Neurology Obstetrics and Gynecology Oncology Ophthalmology Orthopedics Pediatrics Pharmacology Physical Medicine and Rehabilitation Podiatry Preventative Medicine Procedures Psychiatry Pulmonary Medicine Radiology Rheumatology Surgery Urology Drug Information Sheets All Drug Information Sheets  Browse by Alphabet A B C D E F G H I J K L M N O P Q R S T U V W X Y Z  

## 2011-05-18 NOTE — Progress Notes (Signed)
Subjective:     Patient ID: Heather [name], female   DOB: [birth year], 71 y.o.   MRN: [id number]  HPINancy is a 71 yr old here today for f/u. She tells me she is doing good. She says her belly button is still draining x yrs. She has seen Dr. Johna Sheriff recently and was told she would probably have to live with the drainage. Appetite is good. No weight loss. She denies bright red rectal bleeding or melena No abdominal pain. Blood sugar are running 110-118-to 180 in the evening. Hx of colon cancer in October 2007.  She is due to a colonoscopy in August of this year.  Review of Systems  See hp Current Outpatient Prescriptions  Medication Sig Dispense Refill  . acetaminophen (TYLENOL) 100 MG/ML solution Take 10 mg/kg by mouth every 4 (four) hours as needed.        Marland Kitchen alendronate (FOSAMAX) 70 MG tablet TAKE ONE TABLET BY MOUTH ONCE A WEEK  12 tablet  1  . Alendronate Sodium (FOSAMAX PO) Take by mouth. Pt takes one dose weekly       . Calcium Carbonate-Vit D-Min 600-200 MG-UNIT TABS Take 1 tablet by mouth daily.      . Cholecalciferol (D3 ADULT PO) Take 1,000 Units by mouth 1 day or 1 dose.       . cloNIDine (CATAPRES) 0.1 MG tablet TAKE 1 TABLET TWICE A DAY  180 tablet  2  . diphenhydrAMINE (BENADRYL) 25 MG tablet Take 25 mg by mouth every 6 (six) hours as needed. Takes as needed       . diphenhydramine-acetaminophen (TYLENOL PM) 25-500 MG TABS Take 1 tablet by mouth as needed.        . enalapril (VASOTEC) 10 MG tablet TAKE 1 TABLET DAILY  90 tablet  2  . Famotidine (PEPCID AC PO) Take by mouth as needed.      . FUROSEMIDE PO Take by mouth as needed. No dose noted  Pt states prn       . insulin glargine (LANTUS) 100 UNIT/ML injection Inject 30 Units into the skin at bedtime.       . insulin lispro (HUMALOG) 100 UNIT/ML injection Inject 4 Units into the skin 3 (three) times daily before meals.  10 mL  3  . levothyroxine (SYNTHROID, LEVOTHROID) 112 MCG tablet TAKE 1 TABLET DAILY  90 tablet  3  .  metoprolol (LOPRESSOR) 50 MG tablet Take 50 mg by mouth 2 (two) times daily.       . metoprolol succinate (TOPROL-XL) 50 MG 24 hr tablet TAKE 1 TABLET TWICE A DAY  180 tablet  3  . Multiple Vitamin (MULTIVITAMIN) tablet Take 1 tablet by mouth daily.        . potassium chloride (KLOR-CON) 20 MEQ packet Take 20 mEq by mouth 2 (two) times daily. Only takes 1 daily with fluid pill      . warfarin (COUMADIN) 3 MG tablet TAKE AS DIRECTED  90 tablet  2  . ZETIA 10 MG tablet TAKE 1 TABLET DAILY  90 tablet  3   Past Medical History  Diagnosis Date  . Diabetes mellitus   . Hypertension   . Thyroid disease   . Colon cancer   . COPD (chronic obstructive pulmonary disease)   . MRSA infection   . Pulmonary embolus   . Deep vein thrombosis   . Pneumonia   . Rheumatic fever   . Dehydration   . MRSA (methicillin resistant staph aureus) culture  positive   . Arthritis   . History of shingles 02/2010   Past Surgical History  Procedure Date  . Back surgery   . Partial hysterectomy   . Cholecystectomy   . Colostomy takedown   . Arthroscopic repair acl   . Hernia repair   . Breast cyst excision   . Gallbladder surgery   . Back surgery   . Bile duct exploration   . Bladder tact     '70s  . Cystitis   . Port-a-cath removal   . Abcess drainage     abdominal wall   History   Social History  . Marital Status: Married    Spouse Name: N/A    Number of Children: N/A  . Years of Education: N/A   Occupational History  . Not on file.   Social History Main Topics  . Smoking status: Current Everyday Smoker -- 0.7 packs/day for 47 years    Types: Cigarettes  . Smokeless tobacco: Never Used   Comment: Patient smokes less than a pack a day  . Alcohol Use: No  . Drug Use: No  . Sexually Active: Not on file   Other Topics Concern  . Not on file   Social History Narrative  . No narrative on file   Family Status  Relation Status Death Age  . Sister Alive   . Brother Deceased 51  .  Daughter Alive   . Son Alive   . Mother Deceased 3    Breathing Problems  . Father Deceased 45    Breathing Problems  . Sister Deceased 71    Breathing Problems  . Brother Alive     colon cancer   Allergies  Allergen Reactions  . Motrin (Ibuprofen) Rash  . Sulfa Drugs Cross Reactors Rash  . Iohexol      Code: HIVES, Desc: pt states she has dye allergy, Onset Date: [id number]         Objective:   Physical Exam Filed Vitals:   05/18/11 1209  Height: 5' (1.524 m)  Weight: 155 lb 6.4 oz (70.489 kg)  Alert and oriented. Skin warm and dry. Oral mucosa is moist.   . Sclera anicteric, conjunctivae is pink. Thyroid not enlarged. No cervical lymphadenopathy. Lungs clear. Heart regular rate and rhythm.  Abdomen is soft. Clear drainage from umblicus noted. Bowel sounds are positive. No hepatomegaly. No abdominal masses felt. No tenderness.  No edema to lower extremities. Patient is alert and oriented.   CMP     Component Value Date/Time   NA 138 02/02/2011 1552   K 4.0 02/02/2011 1552   CL 102 02/02/2011 1552   CO2 29 02/02/2011 1552   GLUCOSE 285* 02/02/2011 1552   BUN 19 02/02/2011 1552   CREATININE 1.25* 02/02/2011 1552   CREATININE 1.28* 01/05/2011 1632   CALCIUM 9.8 02/02/2011 1552   PROT 7.7 02/02/2011 1552   ALBUMIN 3.1* 02/02/2011 1552   AST 18 02/02/2011 1552   ALT 15 02/02/2011 1552   ALKPHOS 113 02/02/2011 1552   BILITOT 0.2* 02/02/2011 1552   GFRNONAA 42* 02/02/2011 1552   GFRAA 49* 02/02/2011 1552    CBC    Component Value Date/Time   WBC 10.2 04/28/2011 0936   RBC 4.58 04/28/2011 0936   HGB 12.3 04/28/2011 0936   HCT 40.5 04/28/2011 0936   PLT 247 04/28/2011 0936   MCV 88.4 04/28/2011 0936   MCH 26.9 04/28/2011 0936   MCHC 30.4 04/28/2011 0936   RDW 15.8* 04/28/2011 9147  LYMPHSABS 3.8 02/02/2011 1552   MONOABS 0.5 02/02/2011 1552   EOSABS 0.2 02/02/2011 1552   BASOSABS 0.1 02/02/2011 1552  .   04/2011 CEA 2.9 Assessment:       Assessment: Plan #1. Diabetes mellitus. She is  currently on Lantus  . Her control has been suboptimal. Hemoglobin A1c was 7.7 in April 2012.  #2. History of colon carcinoma; status post left, colectomy with colostomy in 2007 followed by adjuvant chemotherapy and she has remained in remission. She had takedown of her colostomy and repair of ventral hernia in July 2009 still has open wound. Surveillance colonoscopy in August of this year #3. History of PE.  #4. Hypothyroidism.             Plan:    Colonoscopy scheduled for August

## 2011-06-10 ENCOUNTER — Other Ambulatory Visit (INDEPENDENT_AMBULATORY_CARE_PROVIDER_SITE_OTHER): Payer: Self-pay | Admitting: Internal Medicine

## 2011-07-16 ENCOUNTER — Ambulatory Visit (INDEPENDENT_AMBULATORY_CARE_PROVIDER_SITE_OTHER): Payer: BC Managed Care – PPO | Admitting: General Surgery

## 2011-07-21 ENCOUNTER — Encounter (HOSPITAL_COMMUNITY): Payer: BC Managed Care – PPO | Attending: Oncology

## 2011-07-21 DIAGNOSIS — C189 Malignant neoplasm of colon, unspecified: Secondary | ICD-10-CM | POA: Insufficient documentation

## 2011-07-21 DIAGNOSIS — Z139 Encounter for screening, unspecified: Secondary | ICD-10-CM | POA: Insufficient documentation

## 2011-07-21 DIAGNOSIS — C186 Malignant neoplasm of descending colon: Secondary | ICD-10-CM

## 2011-07-21 LAB — CBC
Hemoglobin: 12.5 g/dL (ref 12.0–15.0)
MCH: 27.5 pg (ref 26.0–34.0)
MCHC: 31.3 g/dL (ref 30.0–36.0)
MCV: 87.9 fL (ref 78.0–100.0)
Platelets: 281 10*3/uL (ref 150–400)
RBC: 4.55 MIL/uL (ref 3.87–5.11)

## 2011-07-21 LAB — COMPREHENSIVE METABOLIC PANEL
CO2: 29 mEq/L (ref 19–32)
Calcium: 11.2 mg/dL — ABNORMAL HIGH (ref 8.4–10.5)
Creatinine, Ser: 1.37 mg/dL — ABNORMAL HIGH (ref 0.50–1.10)
GFR calc Af Amer: 43 mL/min — ABNORMAL LOW (ref >90)
GFR calc non Af Amer: 37 mL/min — ABNORMAL LOW (ref >90)
Glucose, Bld: 83 mg/dL (ref 70–99)
Sodium: 139 mEq/L (ref 135–145)
Total Protein: 8 g/dL (ref 6.0–8.3)

## 2011-07-21 LAB — DIFFERENTIAL
Basophils Relative: 1 % (ref 0–1)
Eosinophils Absolute: 0.3 10*3/uL (ref 0.0–0.7)
Eosinophils Relative: 3 % (ref 0–5)
Lymphs Abs: 3.5 10*3/uL (ref 0.7–4.0)
Monocytes Relative: 7 % (ref 3–12)
Neutrophils Relative %: 53 % (ref 43–77)

## 2011-07-21 NOTE — Progress Notes (Signed)
Lab draw

## 2011-07-28 ENCOUNTER — Encounter (HOSPITAL_COMMUNITY): Payer: Self-pay | Admitting: Oncology

## 2011-07-28 ENCOUNTER — Encounter (HOSPITAL_BASED_OUTPATIENT_CLINIC_OR_DEPARTMENT_OTHER): Payer: BC Managed Care – PPO | Admitting: Oncology

## 2011-07-28 VITALS — BP 127/71 | HR 72 | Temp 98.2°F | Wt 155.8 lb

## 2011-07-28 DIAGNOSIS — R197 Diarrhea, unspecified: Secondary | ICD-10-CM

## 2011-07-28 DIAGNOSIS — J449 Chronic obstructive pulmonary disease, unspecified: Secondary | ICD-10-CM

## 2011-07-28 DIAGNOSIS — Z139 Encounter for screening, unspecified: Secondary | ICD-10-CM

## 2011-07-28 DIAGNOSIS — C779 Secondary and unspecified malignant neoplasm of lymph node, unspecified: Secondary | ICD-10-CM

## 2011-07-28 DIAGNOSIS — C184 Malignant neoplasm of transverse colon: Secondary | ICD-10-CM

## 2011-07-28 DIAGNOSIS — C189 Malignant neoplasm of colon, unspecified: Secondary | ICD-10-CM

## 2011-07-28 NOTE — Progress Notes (Signed)
Heather [name] presented for labwork. Labs per MD order drawn via Peripheral Line 23 gauge needle inserted in right forearm  Good blood return present. Procedure without incident.  Needle removed intact. Patient tolerated procedure well.

## 2011-07-28 NOTE — Progress Notes (Signed)
Problem #1 stage III colon cancer with one of 10 positive lymph nodes but intermediate grade. The cancer was 4 cm in size and she took adjuvant oxaliplatinum and capecitabine or 6 cycles after surgery on 10/19/2005.  Problem #2 abdominal wall abscess drained by Dr. Marta Lamas. [place]att on 09/16/2009 still with a draining area in she thinks is getting worse in the last couple months.  Problem #3 COPD having smoked for 40 years does not smoke any longer. She stay she states she was smoking last year according to my note but she does not want to CT scan to look at any chances she has lung cancer.  Dru also has not had mammography for several years but declined that as well. She has a lesion on her left for head which is crusty irregular and believes the cages her fingernail is raised off the surface of the scan and I think needs to be seen by Dr. Suan Halter and she did allow [place] to make that appointment.  She also has a history of a PE treated by Dr. Karilyn Cota and is still on Coumadin.  She also has a history of diabetes mellitus peripheral neuropathy deconditioning cholecystectomy partial hysterectomy and hypothyroidism along with hypertension and a small bowel obstruction in the past.  Her vital signs today are recorded in the chart. Her weight son 55 pounds on a 5 foot 0 inches frame. She is afebrile. Her lungs are clear but with diminished breath sounds throughout. She has no adenopathy in any location. Both breasts are negative for masses but tender bilaterally. Her heart shows a regular rhythm and rate without murmur rub or gallop. Her abdomen is soft and nontender bowel sounds are active and she states she's had diarrhea today 3 times with dark colored stool but she wasn't sure that was Green or black. Her labs last week showed mild hypercalcemia. She takes on oral calcium pill a day and is taking that for years and in the past her calciums have been fine. I will check a PTH level. Her legs and arms are  without edema. Her abdomen is soft and nontender without organomegaly but there still isoozing of slightly discolored liquid from her umbilicus. She is to see Dr.Hoxworth next week. In the meantime I think she needs a CT of the abdomen and pelvis to make sure this abscess is not recurring and one more time to make sure she has no recurrent disease. I don't think she needs further CT scans of her abdomen after this. Once again she has refused a CT scan of her chest. She is also declined mammography.  Her oncologic review of systems otherwise noncontributory we will see her back in a year unless something comes up.

## 2011-07-28 NOTE — Patient Instructions (Addendum)
Heather [name]  [id number] [birth year] Dr. Glenford Peers   [facility]  Discharge Instructions  RECOMMENDATIONS MADE BY THE CONSULTANT AND ANY TEST RESULTS WILL BE SENT TO YOUR REFERRING DOCTOR.   EXAM FINDINGS BY MD TODAY AND SIGNS AND SYMPTOMS TO REPORT TO CLINIC OR PRIMARY MD: you are doing well.  Need to see dermatologist about the lesion you have on your forehead.  MEDICATIONS PRESCRIBED: Prednisone 50 mg 13 hours prior to the CT Scans ( ) Prednisone 7 hours prior to the scans (7am) and Prednisone 50 mg and Benadryl 50 mg 1 hour prior to scan (12 noon)    INSTRUCTIONS GIVEN AND DISCUSSED: Other :  Report changes in your bowels, blood in your bowel movement, etc.  SPECIAL INSTRUCTIONS/FOLLOW-UP: Lab work Needed CEA every 3 months , Xray Studies Needed CT of abdomen and pelvis and Return to Clinic in 1 year to see MD.   I acknowledge that I have been informed and understand all the instructions given to me and received a copy. I do not have any more questions at this time, but understand that I may call the Specialty Clinic at [facility] at [phone] during business hours should I have any further questions or need assistance in obtaining follow-up care.    __________________________________________  _____________  __________ Signature of Patient or Authorized Representative            Date                   Time    __________________________________________ Nurse's Signature

## 2011-07-29 ENCOUNTER — Encounter (INDEPENDENT_AMBULATORY_CARE_PROVIDER_SITE_OTHER): Payer: Self-pay | Admitting: *Deleted

## 2011-07-29 ENCOUNTER — Telehealth (HOSPITAL_COMMUNITY): Payer: Self-pay | Admitting: Oncology

## 2011-07-29 LAB — PTH, INTACT AND CALCIUM
Calcium, Total (PTH): 9.5 mg/dL (ref 8.4–10.5)
PTH: 35.7 pg/mL (ref 14.0–72.0)

## 2011-07-30 ENCOUNTER — Telehealth (HOSPITAL_COMMUNITY): Payer: Self-pay | Admitting: *Deleted

## 2011-07-30 NOTE — Telephone Encounter (Signed)
Called in Prednisone 50 mg 13 hours prior to the CT Scans ( ) Prednisone 50 mg 7 hours prior to the scans (7am) and Prednisone 50 mg  1 hour prior to scan (12 noon)  Called to [employer].and pt notified of instructions and to take benadryl 50 mg with the 12 noon dose of prednisone

## 2011-07-31 ENCOUNTER — Telehealth (HOSPITAL_COMMUNITY): Payer: Self-pay | Admitting: *Deleted

## 2011-07-31 ENCOUNTER — Ambulatory Visit (HOSPITAL_COMMUNITY)
Admission: RE | Admit: 2011-07-31 | Discharge: 2011-07-31 | Disposition: A | Discharge status: Home / Self Care | Payer: BC Managed Care – PPO | Source: Ambulatory Visit | Attending: Oncology | Admitting: Oncology

## 2011-07-31 DIAGNOSIS — C189 Malignant neoplasm of colon, unspecified: Secondary | ICD-10-CM

## 2011-07-31 MED ORDER — IOHEXOL 300 MG/ML  SOLN
100.0000 mL | Freq: Once | INTRAMUSCULAR | Status: AC | PRN
  Administered 2011-07-31: 80 mL via INTRAVENOUS

## 2011-07-31 NOTE — Telephone Encounter (Signed)
Pt notified that Dr. Mariel Sleet has sent copy of ct to Dr. Johna Sheriff. Feige sees him on 08/04/2011

## 2011-08-04 ENCOUNTER — Encounter (INDEPENDENT_AMBULATORY_CARE_PROVIDER_SITE_OTHER): Payer: Self-pay | Admitting: General Surgery

## 2011-08-04 ENCOUNTER — Ambulatory Visit (INDEPENDENT_AMBULATORY_CARE_PROVIDER_SITE_OTHER): Payer: BC Managed Care – PPO | Admitting: General Surgery

## 2011-08-04 VITALS — BP 130/72 | HR 66 | Temp 97.4°F | Resp 16 | Ht 60.0 in | Wt 157.0 lb

## 2011-08-04 DIAGNOSIS — K632 Fistula of intestine: Secondary | ICD-10-CM

## 2011-08-04 NOTE — Progress Notes (Signed)
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 She's feeling about the same. She states that a month or 2 ago the area became more sore. She saw Dr. Laurie Panda at that time a CT scan was obtained as below. Since then it is feeling better. No real pain. She puts a bandage on the morning and last all day and is not really bothering her.  Exam: BP 130/72  Pulse 66  Temp 97.4 F (36.3 C) (Temporal)  Resp 16  Ht 5' (1.524 m)  Wt 157 lb (71.215 kg)  BMI 30.66 kg/m2  CT ABDOMEN AND PELVIS WITH CONTRAST  Technique: Multidetector CT imaging of the abdomen and pelvis was  performed following the standard protocol during bolus  administration of intravenous contrast.  Contrast: 80mL OMNIPAQUE IOHEXOL 300 MG/ML SOLN  Comparison: 04/24/2009  Findings: Lung bases are clear. No pericardial or pleural  effusion.  No focal liver abnormality. Prior cholecystectomy. The spleen  appears normal. Stable appearance of the pancreas with fatty  infiltration into the head.Calcification within the tail of  pancreas is similar to previous exam, image 23.  Both adrenal glands are normal. The left kidney appears normal.  Large right-sided extrarenal pelvis appears increased in size from  previous exam. This currently measures 3.2 cm versus 2.1 cm  previously. No obstructing stone or mass noted. The urinary  bladder appears normal.  No adenopathy is present within the upper abdomen. No pelvic or  inguinal adenopathy. Bilateral fat containing inguinal hernias are  noted. No free fluid or fluid collections within the abdomen or  the pelvis.  Normal appearance of the stomach. The small bowel loops have a  normal caliber without evidence for bowel obstruction. Enteric  contrast material is identified within the colon. No obstructing  colonic mass identified.    Postop change involving the ventral abdominal wall is again noted.  Along the undersurface of the ventral abdominal wall within the  left lower quadrant the previously noted gas collection has  improved in the interval. There is a residual fluid collection  measuring 1.6 x 2.1 cm. Previously this area contained gas and  measured 2.9 x 2.3 cm. Periumbilical ventral abdominal wall  abscess has resolved. Along the midline at the level of the  umbilicus is a small gas and fluid collection which measures 1.9 x  1.5 by 3.4 cm.  IMPRESSION:  1. No acute findings and no evidence for residual or recurrence of  tumor.  2. Improvement in the left lower quadrant gas collection along the  undersurface of the abdominal wall.  3. Gas and fluid collection arising from the umbilicus is  identified. Correlate for any clinical signs or symptoms of abscess  or fistula. correlate  Original Report Authenticated By: Rosealee Albee, M.D.  General: No distress Abdomen: Generally soft and nontender. There is a pinpoint fistula opening in her lower midline wound and minimal clear drainage of the bandage to place this morning. No swelling or erythema or tenderness.  Assessment plan: Chronic colocutaneous fistula. Minimally symptomatic at this point. We have previously discussed that this would take a significant operation to repair with risks and she would rather not have this done and I agree entirely with this. She will call me for any worsening symptoms and otherwise will plan to see her back in 6 months.

## 2011-09-07 ENCOUNTER — Other Ambulatory Visit (HOSPITAL_COMMUNITY): Payer: BC Managed Care – PPO

## 2011-10-13 ENCOUNTER — Encounter (HOSPITAL_COMMUNITY): Payer: BC Managed Care – PPO | Attending: Oncology

## 2011-10-13 DIAGNOSIS — C189 Malignant neoplasm of colon, unspecified: Secondary | ICD-10-CM | POA: Insufficient documentation

## 2011-10-13 LAB — DIFFERENTIAL
Basophils Absolute: 0.1 10*3/uL (ref 0.0–0.1)
Basophils Relative: 1 % (ref 0–1)
Monocytes Relative: 6 % (ref 3–12)
Neutro Abs: 4.6 10*3/uL (ref 1.7–7.7)
Neutrophils Relative %: 55 % (ref 43–77)

## 2011-10-13 LAB — CBC
Hemoglobin: 12.8 g/dL (ref 12.0–15.0)
MCHC: 31.8 g/dL (ref 30.0–36.0)
Platelets: 286 10*3/uL (ref 150–400)

## 2011-10-14 LAB — CEA: CEA: 3 ng/mL (ref 0.0–5.0)

## 2011-10-18 ENCOUNTER — Other Ambulatory Visit (INDEPENDENT_AMBULATORY_CARE_PROVIDER_SITE_OTHER): Payer: Self-pay | Admitting: Internal Medicine

## 2011-12-23 ENCOUNTER — Telehealth (INDEPENDENT_AMBULATORY_CARE_PROVIDER_SITE_OTHER): Payer: Self-pay | Admitting: *Deleted

## 2011-12-23 DIAGNOSIS — Z9229 Personal history of other drug therapy: Secondary | ICD-10-CM

## 2011-12-23 NOTE — Telephone Encounter (Signed)
The patient's dentist called and states that the patient was there to have a tooth pulled. He needs a recent Pt/INR result. We do not have that. He ask that we have those results faxed to [phone] to the attention of Dr.Paul Barrett

## 2011-12-25 ENCOUNTER — Other Ambulatory Visit (INDEPENDENT_AMBULATORY_CARE_PROVIDER_SITE_OTHER): Payer: Self-pay | Admitting: Internal Medicine

## 2011-12-28 ENCOUNTER — Encounter (HOSPITAL_COMMUNITY): Payer: BC Managed Care – PPO | Attending: Oncology

## 2011-12-28 DIAGNOSIS — C189 Malignant neoplasm of colon, unspecified: Secondary | ICD-10-CM

## 2011-12-28 LAB — CEA: CEA: 3.7 ng/mL (ref 0.0–5.0)

## 2011-12-28 NOTE — Progress Notes (Signed)
Labs drawn today for cea 

## 2012-01-26 ENCOUNTER — Other Ambulatory Visit (INDEPENDENT_AMBULATORY_CARE_PROVIDER_SITE_OTHER): Payer: Self-pay | Admitting: Internal Medicine

## 2012-02-12 ENCOUNTER — Other Ambulatory Visit (INDEPENDENT_AMBULATORY_CARE_PROVIDER_SITE_OTHER): Payer: Self-pay | Admitting: Internal Medicine

## 2012-03-04 ENCOUNTER — Encounter (INDEPENDENT_AMBULATORY_CARE_PROVIDER_SITE_OTHER): Payer: Self-pay | Admitting: General Surgery

## 2012-03-04 ENCOUNTER — Ambulatory Visit (INDEPENDENT_AMBULATORY_CARE_PROVIDER_SITE_OTHER): Payer: BC Managed Care – PPO | Admitting: General Surgery

## 2012-03-04 VITALS — BP 126/82 | HR 68 | Temp 97.9°F | Resp 18 | Ht 60.0 in | Wt 162.4 lb

## 2012-03-04 DIAGNOSIS — L989 Disorder of the skin and subcutaneous tissue, unspecified: Secondary | ICD-10-CM

## 2012-03-04 DIAGNOSIS — K632 Fistula of intestine: Secondary | ICD-10-CM

## 2012-03-04 NOTE — Addendum Note (Signed)
Addended by: Maryan Puls on: 03/04/2012 02:49 PM   Modules accepted: Orders

## 2012-03-04 NOTE — Patient Instructions (Addendum)
Wash the small wound from the skin lesion removal in the shower daily and cover with a Band-Aid until dry.

## 2012-03-04 NOTE — Progress Notes (Signed)
Chief complaint: Followup fistula  History: Patient returns for routine scheduled followup with history of colostomy takedown in 2009 which was complicated by a contained leak and colocutaneous fistula. We have manage this nonoperatively due to the sphere difficulty she had around the time of surgery. She has minimal drainage that she manages with a 4 x 4 gauze at her umbilicus. Since I saw her 6 months ago the area has been quite stable with minimal drainage and no pain or swelling or any worsening of her symptoms. She does have her notice an itchy rash which is fairly extensive across her abdomen. She uses 6 oxide ointment over this but there has been no tape or drainage or any other substance in contact with the skin of this area. She also notices a raised skin lesion just to the left of her umbilicus.  Exam: BP 126/82  Pulse 68  Temp(Src) 97.9 F (36.6 C) (Temporal)  Resp 18  Ht 5' (1.524 m)  Wt 162 lb 6.4 oz (73.664 kg)  BMI 31.72 kg/m2 General: Elderly somewhat frail but alert comfortable Caucasian female Abdomen: Generally soft and nontender. There is a small area of granulation tissue at the base of the umbilicus with a small amount of non-feculent cloudy drainage. It does appear to be a punctate rash consistent with dermatitis fairly extensively over her lower abdomen. A couple centimeters to the left of the umbilicus is a raised 0.7 x 0.7 x 1 cm verrucous appearing skin lesion. Her abdomen is generally soft and nontender. No masses.  Assessment and plan: #1 chronic colocutaneous fistula. This has been very stable and minimally symptomatic over the last 6 months. We previously decided that we do not want to try to operate on this unless we were forced to and she is very comfortable with her current situation. #2  abdominal wall skin lesion. Probably an inflamed keratosis but I cannot rule out A. Squamous lesion. I recommended excision of local anesthesia I excised the area. The base was  cauterized as to wash this and the shower daily and keep a Band-Aid over it until it is healed. I'll call her with the results.  Return 6 months or sooner as needed.

## 2012-03-09 ENCOUNTER — Telehealth (INDEPENDENT_AMBULATORY_CARE_PROVIDER_SITE_OTHER): Payer: Self-pay | Admitting: General Surgery

## 2012-03-09 NOTE — Telephone Encounter (Signed)
Called pt and discussed path 

## 2012-03-28 ENCOUNTER — Other Ambulatory Visit (HOSPITAL_COMMUNITY): Payer: Self-pay | Admitting: Oncology

## 2012-03-28 ENCOUNTER — Encounter (HOSPITAL_COMMUNITY): Payer: BC Managed Care – PPO | Attending: Oncology

## 2012-03-28 DIAGNOSIS — C186 Malignant neoplasm of descending colon: Secondary | ICD-10-CM

## 2012-03-28 DIAGNOSIS — C189 Malignant neoplasm of colon, unspecified: Secondary | ICD-10-CM | POA: Insufficient documentation

## 2012-03-28 LAB — CEA: CEA: 5.1 ng/mL — ABNORMAL HIGH (ref 0.0–5.0)

## 2012-03-28 NOTE — Progress Notes (Signed)
Labs drawn today for cea 

## 2012-03-29 ENCOUNTER — Other Ambulatory Visit (HOSPITAL_COMMUNITY): Payer: Self-pay

## 2012-03-29 DIAGNOSIS — C189 Malignant neoplasm of colon, unspecified: Secondary | ICD-10-CM

## 2012-04-26 ENCOUNTER — Encounter (HOSPITAL_COMMUNITY): Payer: BC Managed Care – PPO | Attending: Oncology

## 2012-04-26 DIAGNOSIS — C186 Malignant neoplasm of descending colon: Secondary | ICD-10-CM

## 2012-04-26 DIAGNOSIS — C189 Malignant neoplasm of colon, unspecified: Secondary | ICD-10-CM | POA: Insufficient documentation

## 2012-04-26 NOTE — Progress Notes (Signed)
Labs drawn today for cea 

## 2012-04-27 ENCOUNTER — Other Ambulatory Visit (INDEPENDENT_AMBULATORY_CARE_PROVIDER_SITE_OTHER): Payer: Self-pay | Admitting: Internal Medicine

## 2012-04-27 ENCOUNTER — Other Ambulatory Visit (INDEPENDENT_AMBULATORY_CARE_PROVIDER_SITE_OTHER): Payer: Self-pay | Admitting: *Deleted

## 2012-04-27 MED ORDER — LEVOTHYROXINE SODIUM 112 MCG PO TABS: 112.0000 ug | ORAL_TABLET | Freq: Every day | ORAL | Status: DC

## 2012-04-27 NOTE — Telephone Encounter (Signed)
I sent this to Delrae Rend to address

## 2012-04-27 NOTE — Telephone Encounter (Signed)
Heather [name] called and said she got her last refill on Levothyroxin from the mail order pharmacy. She is needing this medication filled and would need it sent to [facility] in [place].

## 2012-05-03 ENCOUNTER — Other Ambulatory Visit (HOSPITAL_COMMUNITY): Payer: BC Managed Care – PPO

## 2012-06-21 ENCOUNTER — Encounter (HOSPITAL_COMMUNITY): Payer: BC Managed Care – PPO | Attending: Oncology

## 2012-06-21 DIAGNOSIS — C186 Malignant neoplasm of descending colon: Secondary | ICD-10-CM

## 2012-06-21 DIAGNOSIS — C189 Malignant neoplasm of colon, unspecified: Secondary | ICD-10-CM | POA: Insufficient documentation

## 2012-06-21 NOTE — Progress Notes (Signed)
Labs drawn today for cea 

## 2012-06-28 ENCOUNTER — Other Ambulatory Visit (HOSPITAL_COMMUNITY): Payer: BC Managed Care – PPO

## 2012-07-27 ENCOUNTER — Encounter (HOSPITAL_COMMUNITY): Payer: BC Managed Care – PPO | Attending: Oncology

## 2012-07-27 VITALS — BP 125/72 | HR 64 | Temp 97.8°F | Resp 18 | Wt 153.0 lb

## 2012-07-27 DIAGNOSIS — C189 Malignant neoplasm of colon, unspecified: Secondary | ICD-10-CM | POA: Insufficient documentation

## 2012-07-27 NOTE — Patient Instructions (Addendum)
[facility] Discharge Instructions  RECOMMENDATIONS MADE BY THE CONSULTANT AND ANY TEST RESULTS WILL BE SENT TO YOUR REFERRING PHYSICIAN.  EXAM FINDINGS BY THE PHYSICIAN TODAY AND SIGNS OR SYMPTOMS TO REPORT TO CLINIC OR PRIMARY PHYSICIAN: exam and discussion by Dr. Orlie Dakin.  You are doing well.  MEDICATIONS PRESCRIBED:  none  INSTRUCTIONS GIVEN AND DISCUSSED: Report changes in bowel habits, blood in your stool or other problems.  SPECIAL INSTRUCTIONS/FOLLOW-UP: Follow-up in 1 year with blood work and MD exam.  Thank you for choosing [name] to provide your oncology and hematology care.  To afford each patient quality time with our providers, please arrive at least 15 minutes before your scheduled appointment time.  With your help, our goal is to use those 15 minutes to complete the necessary work-up to ensure our physicians have the information they need to help with your evaluation and healthcare recommendations.    Effective January 1st, 2014, we ask that you re-schedule your appointment with our physicians should you arrive 10 or more minutes late for your appointment.  We strive to give you quality time with our providers, and arriving late affects you and other patients whose appointments are after yours.    Again, thank you for choosing [facility].  Our hope is that these requests will decrease the amount of time that you wait before being seen by our physicians.       _____________________________________________________________  Should you have questions after your visit to [facility], please contact our office at [phone] between the hours of 8:30 a.m. and 5:00 p.m.  Voicemails left after 4:30 p.m. will not be returned until the following business day.  For prescription refill requests, have your pharmacy contact our office with your prescription refill request.

## 2012-07-27 NOTE — Progress Notes (Signed)
History of present illness:  Patient returns to clinic today for routine yearly followup for stage III colon cancer. Her only complaint is of "staggering" over the past several days. She denies any falls or injuries. She otherwise feels well. She has no other neurologic complaints. She has a good appetite and denies weight loss. She has had no recent fevers or illnesses. She denies any chest pain or shortness of breath. She has no nausea, vomiting, constipation, or diarrhea. She is noted no changes in her bowel movements. She denies any melena or hematochezia. She has no urinary complaints. Patient feels that her baseline and offers no further specific complaints today.  Review of systems: As per history of present illness, otherwise a 10 system review is negative.  General:  Well-developed, well-nourished, no acute distress. Mental status: Normal affect. Eyes: Anicteric sclera. Respiratory: Rare scattered wheeze. Cardiovascular: Regular rate and rhythm, no rubs, murmurs, or gallops. Gastrointestinal: Soft, nontender, nondistended, normoactive bowel sounds. Musculoskeletal: 1-2+ bilateral lower extremity edema. Skin: No rashes or petechiae noted. Neurological: Alert, answering all questions appropriately. Cranial nerves grossly intact.    Assessment and plan:  1. Stage III colon cancer with one of 10 positive lymph nodes but intermediate grade. The cancer was 4 cm in size and she took adjuvant oxaliplatinum and capecitabine or 6 cycles after surgery on 10/19/2005. No evidence of disease. Patient's CEA continues to be within normal limits. Patient is now nearly 8 years removed from completing her chemotherapy. Which she is 10 years out, he likely can be discharged from clinic. Return to clinic in one year for laboratory work in further evaluation. Patient expressed understanding and was in agreement with this plan. 2.  Abdominal wall abscess drained by Dr. Marta Lamas. Wyatt on 09/16/2009. 3.  COPD:  No heat issues, patient is discontinued tobacco. Previously she was offered a screening CT scan for lung cancer which she declined. 4.  4. History of PE: Patient continues to be on Coumadin and her INRs are managed by Dr. Karilyn Cota.

## 2012-09-09 ENCOUNTER — Other Ambulatory Visit (INDEPENDENT_AMBULATORY_CARE_PROVIDER_SITE_OTHER): Payer: Self-pay | Admitting: Internal Medicine

## 2012-11-25 ENCOUNTER — Other Ambulatory Visit (INDEPENDENT_AMBULATORY_CARE_PROVIDER_SITE_OTHER): Payer: Self-pay | Admitting: Internal Medicine

## 2012-11-25 NOTE — Telephone Encounter (Signed)
Please call patient and tell her she should not take Naprosyn or other arthritis medications while on warfarin.

## 2012-12-26 ENCOUNTER — Other Ambulatory Visit (INDEPENDENT_AMBULATORY_CARE_PROVIDER_SITE_OTHER): Payer: Self-pay | Admitting: *Deleted

## 2012-12-26 NOTE — Telephone Encounter (Signed)
Rec'd a fax for a refill on the Lantus Vial 100 U/ML , Pres Qty:10 Sig: Inject 30 units Subcutaneously Every Day At Bedtime for 30 days

## 2012-12-27 MED ORDER — LANTUS 100 UNIT/ML ~~LOC~~ SOLN: SUBCUTANEOUS | Status: DC

## 2013-01-30 ENCOUNTER — Other Ambulatory Visit (INDEPENDENT_AMBULATORY_CARE_PROVIDER_SITE_OTHER): Payer: Self-pay | Admitting: Internal Medicine

## 2013-02-04 ENCOUNTER — Other Ambulatory Visit (INDEPENDENT_AMBULATORY_CARE_PROVIDER_SITE_OTHER): Payer: Self-pay | Admitting: Internal Medicine

## 2013-03-01 ENCOUNTER — Other Ambulatory Visit (INDEPENDENT_AMBULATORY_CARE_PROVIDER_SITE_OTHER): Payer: Self-pay | Admitting: Internal Medicine

## 2013-03-01 ENCOUNTER — Telehealth (INDEPENDENT_AMBULATORY_CARE_PROVIDER_SITE_OTHER): Payer: Self-pay | Admitting: *Deleted

## 2013-03-01 LAB — HEMOGLOBIN A1C
HEMOGLOBIN A1C: 9.1 % — AB (ref <5.7)
MEAN PLASMA GLUCOSE: 214 mg/dL — AB (ref <117)

## 2013-03-01 LAB — PROTIME-INR
INR: 1.88 — AB (ref <1.50)
PROTHROMBIN TIME: 21.2 s — AB (ref 11.6–15.2)

## 2013-03-01 NOTE — Telephone Encounter (Signed)
Heather [name] is going to have her INR checked today, 03/01/13. The standing order expires next month.

## 2013-03-01 NOTE — Telephone Encounter (Signed)
Lab will send renewal for this.

## 2013-03-03 ENCOUNTER — Emergency Department (HOSPITAL_COMMUNITY)
Admission: EM | Admit: 2013-03-03 | Discharge: 2013-03-04 | Disposition: A | Discharge status: Home / Self Care | Payer: BC Managed Care – PPO | Attending: Emergency Medicine | Admitting: Emergency Medicine

## 2013-03-03 ENCOUNTER — Encounter (HOSPITAL_COMMUNITY): Payer: Self-pay | Admitting: Emergency Medicine

## 2013-03-03 ENCOUNTER — Emergency Department (HOSPITAL_COMMUNITY): Payer: BC Managed Care – PPO

## 2013-03-03 DIAGNOSIS — F17200 Nicotine dependence, unspecified, uncomplicated: Secondary | ICD-10-CM | POA: Insufficient documentation

## 2013-03-03 DIAGNOSIS — I10 Essential (primary) hypertension: Secondary | ICD-10-CM | POA: Insufficient documentation

## 2013-03-03 DIAGNOSIS — Y929 Unspecified place or not applicable: Secondary | ICD-10-CM | POA: Insufficient documentation

## 2013-03-03 DIAGNOSIS — M129 Arthropathy, unspecified: Secondary | ICD-10-CM | POA: Insufficient documentation

## 2013-03-03 DIAGNOSIS — S79919A Unspecified injury of unspecified hip, initial encounter: Secondary | ICD-10-CM | POA: Insufficient documentation

## 2013-03-03 DIAGNOSIS — Y9389 Activity, other specified: Secondary | ICD-10-CM | POA: Insufficient documentation

## 2013-03-03 DIAGNOSIS — Z8701 Personal history of pneumonia (recurrent): Secondary | ICD-10-CM | POA: Insufficient documentation

## 2013-03-03 DIAGNOSIS — Z79899 Other long term (current) drug therapy: Secondary | ICD-10-CM | POA: Insufficient documentation

## 2013-03-03 DIAGNOSIS — Z9889 Other specified postprocedural states: Secondary | ICD-10-CM | POA: Insufficient documentation

## 2013-03-03 DIAGNOSIS — E119 Type 2 diabetes mellitus without complications: Secondary | ICD-10-CM | POA: Insufficient documentation

## 2013-03-03 DIAGNOSIS — Z933 Colostomy status: Secondary | ICD-10-CM | POA: Insufficient documentation

## 2013-03-03 DIAGNOSIS — Z86718 Personal history of other venous thrombosis and embolism: Secondary | ICD-10-CM | POA: Insufficient documentation

## 2013-03-03 DIAGNOSIS — Z8619 Personal history of other infectious and parasitic diseases: Secondary | ICD-10-CM | POA: Insufficient documentation

## 2013-03-03 DIAGNOSIS — IMO0002 Reserved for concepts with insufficient information to code with codable children: Secondary | ICD-10-CM | POA: Insufficient documentation

## 2013-03-03 DIAGNOSIS — S79929A Unspecified injury of unspecified thigh, initial encounter: Secondary | ICD-10-CM

## 2013-03-03 DIAGNOSIS — E079 Disorder of thyroid, unspecified: Secondary | ICD-10-CM | POA: Insufficient documentation

## 2013-03-03 DIAGNOSIS — R748 Abnormal levels of other serum enzymes: Secondary | ICD-10-CM | POA: Insufficient documentation

## 2013-03-03 DIAGNOSIS — R296 Repeated falls: Secondary | ICD-10-CM | POA: Insufficient documentation

## 2013-03-03 DIAGNOSIS — J441 Chronic obstructive pulmonary disease with (acute) exacerbation: Secondary | ICD-10-CM | POA: Insufficient documentation

## 2013-03-03 DIAGNOSIS — Z85038 Personal history of other malignant neoplasm of large intestine: Secondary | ICD-10-CM | POA: Insufficient documentation

## 2013-03-03 DIAGNOSIS — Z794 Long term (current) use of insulin: Secondary | ICD-10-CM | POA: Insufficient documentation

## 2013-03-03 DIAGNOSIS — R112 Nausea with vomiting, unspecified: Secondary | ICD-10-CM | POA: Insufficient documentation

## 2013-03-03 DIAGNOSIS — Z86711 Personal history of pulmonary embolism: Secondary | ICD-10-CM | POA: Insufficient documentation

## 2013-03-03 DIAGNOSIS — W19XXXA Unspecified fall, initial encounter: Secondary | ICD-10-CM

## 2013-03-03 DIAGNOSIS — R7989 Other specified abnormal findings of blood chemistry: Secondary | ICD-10-CM

## 2013-03-03 LAB — CBC WITH DIFFERENTIAL/PLATELET
BASOS ABS: 0.1 10*3/uL (ref 0.0–0.1)
BASOS PCT: 1 % (ref 0–1)
EOS ABS: 0.3 10*3/uL (ref 0.0–0.7)
Eosinophils Relative: 3 % (ref 0–5)
HCT: 27.9 % — ABNORMAL LOW (ref 36.0–46.0)
Hemoglobin: 9 g/dL — ABNORMAL LOW (ref 12.0–15.0)
Lymphocytes Relative: 29 % (ref 12–46)
Lymphs Abs: 2.7 10*3/uL (ref 0.7–4.0)
MCH: 27.9 pg (ref 26.0–34.0)
MCHC: 32.3 g/dL (ref 30.0–36.0)
MCV: 86.4 fL (ref 78.0–100.0)
MONOS PCT: 7 % (ref 3–12)
Monocytes Absolute: 0.7 10*3/uL (ref 0.1–1.0)
NEUTROS PCT: 60 % (ref 43–77)
Neutro Abs: 5.6 10*3/uL (ref 1.7–7.7)
PLATELETS: 403 10*3/uL — AB (ref 150–400)
RBC: 3.23 MIL/uL — ABNORMAL LOW (ref 3.87–5.11)
RDW: 16.7 % — AB (ref 11.5–15.5)
WBC: 9.3 10*3/uL (ref 4.0–10.5)

## 2013-03-03 LAB — COMPREHENSIVE METABOLIC PANEL
ALBUMIN: 2.8 g/dL — AB (ref 3.5–5.2)
ALK PHOS: 213 U/L — AB (ref 39–117)
ALT: 8 U/L (ref 0–35)
AST: 20 U/L (ref 0–37)
BUN: 42 mg/dL — AB (ref 6–23)
CO2: 27 mEq/L (ref 19–32)
Calcium: 9.4 mg/dL (ref 8.4–10.5)
Chloride: 95 mEq/L — ABNORMAL LOW (ref 96–112)
Creatinine, Ser: 2.81 mg/dL — ABNORMAL HIGH (ref 0.50–1.10)
GFR calc Af Amer: 18 mL/min — ABNORMAL LOW (ref >90)
GFR calc non Af Amer: 16 mL/min — ABNORMAL LOW (ref >90)
Glucose, Bld: 270 mg/dL — ABNORMAL HIGH (ref 70–99)
POTASSIUM: 4.7 meq/L (ref 3.7–5.3)
SODIUM: 134 meq/L — AB (ref 137–147)
TOTAL PROTEIN: 8 g/dL (ref 6.0–8.3)
Total Bilirubin: 0.2 mg/dL — ABNORMAL LOW (ref 0.3–1.2)

## 2013-03-03 LAB — CG4 I-STAT (LACTIC ACID): LACTIC ACID, VENOUS: 1.71 mmol/L (ref 0.5–2.2)

## 2013-03-03 MED ORDER — HYDROMORPHONE HCL PF 1 MG/ML IJ SOLN
1.0000 mg | Freq: Once | INTRAMUSCULAR | Status: AC
  Administered 2013-03-03: 1 mg via INTRAVENOUS
  Filled 2013-03-03: qty 1

## 2013-03-03 MED ORDER — ONDANSETRON HCL 4 MG/2ML IJ SOLN
INTRAMUSCULAR | Status: AC
  Filled 2013-03-03: qty 2

## 2013-03-03 MED ORDER — ONDANSETRON HCL 4 MG/2ML IJ SOLN
4.0000 mg | Freq: Once | INTRAMUSCULAR | Status: AC
  Administered 2013-03-03: 4 mg via INTRAVENOUS

## 2013-03-03 MED ORDER — HYDROMORPHONE HCL PF 1 MG/ML IJ SOLN
1.0000 mg | Freq: Once | INTRAMUSCULAR | Status: AC
  Administered 2013-03-03: 1 mg via INTRAVENOUS

## 2013-03-03 MED ORDER — HYDROMORPHONE HCL PF 1 MG/ML IJ SOLN
INTRAMUSCULAR | Status: AC
  Filled 2013-03-03: qty 1

## 2013-03-03 NOTE — ED Provider Notes (Signed)
CSN: [id number]     Arrival date & time 03/03/13  2152 History   This chart was scribed for Carmin Muskrat, MD by Era Bumpers, ED scribe. This patient was seen in room APA04/APA04 and the patient's care was started at 2152.  Chief Complaint  Patient presents with  . Back Pain   Patient is a 73 y.o. female presenting with back pain. The history is provided by the patient. No language interpreter was used.  Back Pain  HPI Comments: Heather [name] is a 73 y.o. female who presents to the Emergency Department complaining of right lower back pain and right hip pain after she fell while getting up to go to the bathroom 4 days ago. She reports that she fell backwards onto her bottom and has been having soreness in her right lower back and coccyx. Didn't hit her head, no LOC. Ambulating since then and walks w/a cane for assistance baseline. No unilateral loss of sensation nor weakness of her extremities. No lacerations or abrasions.  She reports before her fall, she has been doing fine/no recent illnesses. She has been taking Tylenol and aleve w/no relief.  She also reports nausea and emesis episodes onset this PM, unsure what is causing her episodes. This is a new problem for her. Denies CP or abdominal pain. She has hx of Colon CA which was surgically tx, has had colostomy reversal and has been CA free for x8 years. She denies SOB, but does c/o some mild wheezing w/deep breaths.   She is a smoker.  Past Medical History  Diagnosis Date  . Diabetes mellitus   . Hypertension   . Thyroid disease   . Colon cancer   . COPD (chronic obstructive pulmonary disease)   . MRSA infection   . Pulmonary embolus   . Deep vein thrombosis   . Pneumonia   . Rheumatic fever   . Dehydration   . MRSA (methicillin resistant staph aureus) culture positive   . Arthritis   . History of shingles 02/2010   Past Surgical History  Procedure Laterality Date  . Back surgery    . Partial hysterectomy    .  Cholecystectomy    . Colostomy takedown    . Arthroscopic repair acl    . Hernia repair    . Breast cyst excision    . Gallbladder surgery    . Back surgery    . Bile duct exploration    . Bladder tact      '70s  . Cystitis    . Port-a-cath removal    . Abcess drainage      abdominal wall   Family History  Problem Relation Age of Onset  . Heart disease Sister   . Heart disease Brother   . Healthy Daughter   . Healthy Son   . Heart disease Mother   . Heart disease Father    History  Substance Use Topics  . Smoking status: Current Every Day Smoker -- 0.75 packs/day for 47 years    Types: Cigarettes  . Smokeless tobacco: Never Used     Comment: Patient smokes less than a pack a day  . Alcohol Use: No   OB History   Grav Para Term Preterm Abortions TAB SAB Ect Mult Living                 Review of Systems  Constitutional:       Per HPI, otherwise negative  HENT:  Per HPI, otherwise negative  Respiratory:       Per HPI, otherwise negative  Cardiovascular: Positive for leg swelling (mild ankle swelling).       Per HPI, otherwise negative  Gastrointestinal: Positive for nausea and vomiting.  Endocrine:       Negative aside from HPI  Genitourinary:       Neg aside from HPI   Musculoskeletal: Positive for back pain.       Per HPI, otherwise negative  Skin: Negative.   Neurological: Negative for syncope.  All other systems reviewed and are negative.   Allergies  Motrin; Sulfa drugs cross reactors; Iohexol; and Quinine derivatives  Home Medications   Current Outpatient Rx  Name  Route  Sig  Dispense  Refill  . acetaminophen (TYLENOL) 100 MG/ML solution   Oral   Take 10 mg/kg by mouth every 4 (four) hours as needed.           Marland Kitchen alendronate (FOSAMAX) 70 MG tablet      TAKE ONE TABLET BY MOUTH ONCE A WEEK   12 tablet   1   . Calcium Carbonate-Vit D-Min 600-200 MG-UNIT TABS   Oral   Take 1 tablet by mouth daily.         . Cholecalciferol (D3  ADULT PO)   Oral   Take 1,000 Units by mouth 1 day or 1 dose.          . cloNIDine (CATAPRES) 0.1 MG tablet      TAKE 1 TABLET TWICE A DAY   180 tablet   3   . diphenhydrAMINE (BENADRYL) 25 MG tablet   Oral   Take 25 mg by mouth every 6 (six) hours as needed. Takes as needed          . diphenhydramine-acetaminophen (TYLENOL PM) 25-500 MG TABS   Oral   Take 1 tablet by mouth as needed.           . Famotidine (PEPCID AC PO)   Oral   Take by mouth as needed.         . FUROSEMIDE PO   Oral   Take 40 mg by mouth as needed. No dose noted  Pt states prn         . Homeopathic Products (ALLERGY MEDICINE PO)   Oral   Take by mouth as needed.         Marland Kitchen LANTUS 100 UNIT/ML injection      INJECT 30 UNITS UNDER THE SKIN EVERY NIGHT AT BEDTIME   3 vial   3     Dispense as written.   Marland Kitchen levothyroxine (LEVOTHROID) 112 MCG tablet   Oral   Take 1 tablet (112 mcg total) by mouth daily before breakfast.   30 tablet   3   . metoprolol succinate (TOPROL-XL) 50 MG 24 hr tablet      TAKE 1 TABLET TWICE A DAY   180 tablet   3   . Multiple Vitamins-Minerals (WOMENS MULTIVITAMIN PLUS PO)   Oral   Take 1 capsule by mouth.         . potassium chloride (KLOR-CON) 20 MEQ packet   Oral   Take 20 mEq by mouth daily. Only takes 1 daily with fluid pill         . warfarin (COUMADIN) 3 MG tablet      TAKE AS DIRECTED   90 tablet   3   . ZETIA 10 MG tablet  TAKE 1 TABLET DAILY   90 tablet   3    Triage Vitals: BP 149/72  Pulse 69  Resp 20  Ht 5\' 1"  (1.549 m)  Wt 150 lb (68.04 kg)  BMI 28.36 kg/m2  SpO2 97%  Physical Exam  Nursing note and vitals reviewed. Constitutional: She is oriented to person, place, and time. She appears well-developed and well-nourished. No distress.  HENT:  Head: Normocephalic and atraumatic.  Eyes: Conjunctivae and EOM are normal.  Cardiovascular: Normal rate, regular rhythm and normal heart sounds.   No murmur  heard. Pulmonary/Chest: Effort normal. No stridor. No respiratory distress. She has wheezes.  Abdominal: She exhibits no distension.  Stoma over her abdomen. No surrounding redness and no bleeding/drainage  Musculoskeletal: She exhibits no edema.  Neurological: She is alert and oriented to person, place, and time. No cranial nerve deficit.  Good strength BLE  Skin: Skin is warm and dry.  Psychiatric: She has a normal mood and affect.   ED Course  Procedures (including critical care time) DIAGNOSTIC STUDIES: Oxygen Saturation is 97% on room air, normal by my interpretation.    COORDINATION OF CARE: At 1045 PM Discussed treatment plan with patient which includes CXR, pain medicine, blood work, UA. Patient agrees.   Labs Review Labs Reviewed  CBC WITH DIFFERENTIAL - Abnormal; Notable for the following:    RBC 3.23 (*)    Hemoglobin 9.0 (*)    HCT 27.9 (*)    RDW 16.7 (*)    Platelets 403 (*)    All other components within normal limits  COMPREHENSIVE METABOLIC PANEL - Abnormal; Notable for the following:    Sodium 134 (*)    Chloride 95 (*)    Glucose, Bld 270 (*)    BUN 42 (*)    Creatinine, Ser 2.81 (*)    Albumin 2.8 (*)    Alkaline Phosphatase 213 (*)    Total Bilirubin 0.2 (*)    GFR calc non Af Amer 16 (*)    GFR calc Af Amer 18 (*)    All other components within normal limits  PRO B NATRIURETIC PEPTIDE - Abnormal; Notable for the following:    Pro B Natriuretic peptide (BNP) 731.0 (*)    All other components within normal limits  URINALYSIS, ROUTINE W REFLEX MICROSCOPIC  CG4 I-STAT (LACTIC ACID)   Imaging Review Dg Chest 2 View  03/03/2013   CLINICAL DATA:  Chest discomfort  EXAM: CHEST  2 VIEW  COMPARISON:  Prior chest x-ray 09/16/2009  FINDINGS: Cardiac and mediastinal contours remain within normal limits. Atherosclerotic calcification noted in the transverse aorta. Stable appearance of the chest with a diffuse central bronchitic changes and interstitial  prominence. Superimposed hyperexpansion suggests underlying COPD and emphysema. No suspicious pulmonary nodule or mass. No acute osseous abnormality.  IMPRESSION: Stable chest x-ray with chronic background changes but no evidence of acute cardiopulmonary process.   Electronically Signed   By: Malachy Moan M.D.   On: 03/03/2013 23:41   Dg Hip Complete Right  03/03/2013   CLINICAL DATA:  Fall 3 days ago, right hip pain  EXAM: RIGHT HIP - COMPLETE 2+ VIEW  COMPARISON:  Prior CT abdomen/ pelvis 07/31/2011  FINDINGS: There is no evidence of hip fracture or dislocation. There is no evidence of arthropathy or other focal bone abnormality. Atherosclerotic vascular calcifications noted throughout the femoral vessels. Mild degenerative change in the bilateral hip joints. The bones appear osteopenic. Surgical clips noted throughout the lower abdomen and pelvis.  IMPRESSION: 1. No acute fracture or malalignment. 2. Mild bilateral hip joint osteoarthritis. 3. Atherosclerotic vascular calcifications.   Electronically Signed   By: Jacqulynn Cadet M.D.   On: 03/03/2013 23:46    12:12 AM Patient appears calm.  I discussed all findings with her and the multiple family members.  Notably, we discussed the elevation in creatinine since last value to 2 years ago.  Patient states that she has a primary care with them she may followup in the next days. Patient has a preference for discharge.   MDM  I personally performed the services described in this documentation, which was scribed in my presence. The recorded information has been reviewed and is accurate.   This patient presents several days after a fall, with ongoing generalized discomfort, one episode of emesis.  On exam she is awake, alert, hemodynamically stable, interacting appropriately.  Given the duration of time since the fall, there is low suspicion for fracture, though x-rays were performed.  These were reassuring.  Patient has been ambulatory, having  further reassurance for low suspicion of fracture.  With her nausea, vomiting, prior cancer history, she had additional imaging performed.  These studies were similarly reassuring aside from elevation in creatinine.  Patient was still stable for discharge with close outpatient followup for further discussion of creatinine increased, consideration of new medications, diet. Patient was discharged in stable condition to follow up with primary care      Carmin Muskrat, MD 03/04/13 [phone]

## 2013-03-03 NOTE — ED Notes (Signed)
Fell Monday by loosing balance and falling onto buttocks.  Had pain in R side back and coccyx.

## 2013-03-04 LAB — PRO B NATRIURETIC PEPTIDE: Pro B Natriuretic peptide (BNP): 731 pg/mL — ABNORMAL HIGH (ref 0–125)

## 2013-03-04 MED ORDER — ONDANSETRON 4 MG PO TBDP
ORAL_TABLET | ORAL | Status: AC
  Filled 2013-03-04: qty 1

## 2013-03-04 MED ORDER — ONDANSETRON 4 MG PO TBDP
4.0000 mg | ORAL_TABLET | Freq: Once | ORAL | Status: AC
  Administered 2013-03-04: 4 mg via ORAL

## 2013-03-04 MED ORDER — ONDANSETRON 4 MG PO TBDP: 4.0000 mg | ORAL_TABLET | Freq: Three times a day (TID) | ORAL | Status: AC | PRN

## 2013-03-04 NOTE — Discharge Instructions (Signed)
As discussed, it is important that you follow up as soon as possible with your physician for continued management of your condition. ° °If you develop any new, or concerning changes in your condition, please return to the emergency department immediately. ° °

## 2013-03-06 ENCOUNTER — Encounter (INDEPENDENT_AMBULATORY_CARE_PROVIDER_SITE_OTHER): Payer: Self-pay | Admitting: Internal Medicine

## 2013-03-06 ENCOUNTER — Ambulatory Visit (INDEPENDENT_AMBULATORY_CARE_PROVIDER_SITE_OTHER): Payer: BC Managed Care – PPO | Admitting: Internal Medicine

## 2013-03-06 VITALS — BP 128/60 | HR 84 | Temp 97.9°F | Ht 61.0 in | Wt 151.0 lb

## 2013-03-06 DIAGNOSIS — E119 Type 2 diabetes mellitus without complications: Secondary | ICD-10-CM

## 2013-03-06 DIAGNOSIS — N189 Chronic kidney disease, unspecified: Secondary | ICD-10-CM

## 2013-03-06 DIAGNOSIS — R198 Other specified symptoms and signs involving the digestive system and abdomen: Secondary | ICD-10-CM

## 2013-03-06 DIAGNOSIS — R799 Abnormal finding of blood chemistry, unspecified: Secondary | ICD-10-CM

## 2013-03-06 DIAGNOSIS — D649 Anemia, unspecified: Secondary | ICD-10-CM

## 2013-03-06 DIAGNOSIS — Z86711 Personal history of pulmonary embolism: Secondary | ICD-10-CM

## 2013-03-06 DIAGNOSIS — C189 Malignant neoplasm of colon, unspecified: Secondary | ICD-10-CM

## 2013-03-06 DIAGNOSIS — E039 Hypothyroidism, unspecified: Secondary | ICD-10-CM

## 2013-03-06 DIAGNOSIS — R7989 Other specified abnormal findings of blood chemistry: Secondary | ICD-10-CM

## 2013-03-06 MED ORDER — INSULIN LISPRO 100 UNIT/ML CARTRIDGE: SUBCUTANEOUS | Status: DC

## 2013-03-06 MED ORDER — WARFARIN SODIUM 3 MG PO TABS: 3.0000 mg | ORAL_TABLET | Freq: Every day | ORAL | Status: DC

## 2013-03-06 NOTE — Patient Instructions (Addendum)
Do not take Advil any more. Humalog at mealtime according to sliding scale.

## 2013-03-06 NOTE — Progress Notes (Signed)
Presenting complaint;  Anemia and elevated serum creatinine noted on recent ER visit.  Subjective:  Patient is 73 year old Caucasian female with multiple medical problems who has not been seen in her office since April 2013. She was to have surveillance colonoscopy in August 2013 but this never materialized. Her primary care physician is Dr. Everette Rank and she has not seen them recently. On 02/28/2013 she fell while she was getting out of bed. She has noted pain in her tailbone. Pain got worse and she went to emergency room on 03/03/2013. X-rays were negative for fracture. She was noted to have a limited serum creatinine and low hemoglobin and therefore a device to arrange for this visit. She also had hemoglobin A1c which was high. She says she does not feel well. She complains of feeling weak. She denies chest pain or shortness of breath. She has poor appetite and has not lost any weight recently. Since her fall she's been taking Aleve twice daily. She has noted lower extremity edema for about one week. She has experienced nausea but no vomiting. She denies heartburn dysphagia abdominal pain melena or rectal bleeding. She generally has 2 bowel movements per day. 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 He did see Dr. Excell Seltzer in February last year. Because drainage has been minimal it was decided to watch it rather than proceed with surgery. Patient states she has not checked her glucose levels recently. She is not even sure if Glucometer is working. She also complains of knee and leg pain and uses a cane to ambulate. She is still smoking cigarettes but now down to 5 cigarettes per day.  Current Medications: Current Outpatient Prescriptions  Medication Sig Dispense Refill  . acetaminophen (TYLENOL) 500 MG tablet Take 500 mg by mouth every 8 (eight) hours as needed.      . Calcium Carbonate-Vit D-Min 600-200 MG-UNIT TABS Take 1 tablet by mouth daily.       . Cholecalciferol (D3 ADULT PO) Take 1,000 Units by mouth daily.       . cloNIDine (CATAPRES) 0.1 MG tablet Take 0.1 mg by mouth 2 (two) times daily.      Marland Kitchen ezetimibe (ZETIA) 10 MG tablet Take 10 mg by mouth daily.      . furosemide (LASIX) 40 MG tablet Take 40 mg by mouth daily as needed.       Marland Kitchen ibuprofen (ADVIL,MOTRIN) 200 MG tablet Take 200 mg by mouth 2 (two) times daily.      . insulin glargine (LANTUS) 100 UNIT/ML injection Inject 30 Units into the skin at bedtime.      Marland Kitchen levothyroxine (LEVOTHROID) 112 MCG tablet Take 1 tablet (112 mcg total) by mouth daily before breakfast.  30 tablet  3  . metoprolol succinate (TOPROL-XL) 50 MG 24 hr tablet Take 50 mg by mouth 2 (two) times daily. Take with or immediately following a meal.      . Multiple Vitamins-Minerals (WOMENS MULTIVITAMIN PLUS PO) Take 1 capsule by mouth.      . ondansetron (ZOFRAN ODT) 4 MG disintegrating tablet Take 1 tablet (4 mg total) by mouth every 8 (eight) hours as needed for nausea or vomiting.  20 tablet  0  . potassium chloride (KLOR-CON) 20 MEQ packet Take 20 mEq by mouth daily as needed.       . warfarin (COUMADIN) 3 MG tablet Take 3 mg by mouth daily.       No current facility-administered medications for this visit.  Objective: Blood pressure 128/60, pulse 84, temperature 97.9 F (36.6 C), height 5\' 1"  (1.549 m), weight 151 lb (68.493 kg). Patient is alert and in no acute distress. 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 Conjunctiva is pink. Sclera is nonicteric Oropharyngeal mucosa is normal. No neck masses or thyromegaly noted. Cardiac exam with regular rhythm normal S1 and S2. No murmur or gallop noted. Lungs are clear to auscultation. Abdomen she has midline scar. There is a small open area with erythema to skin. On palpation there is nodularity to upper abdomen with mild tenderness. No hepatosplenomegaly or masses.  She has 1+ edema to both legs.  Labs/studies Results: Lab data from  03/03/2013. WBC 9.3, H&H 9.0 and 27.9, MCV 86.4 and platelet count 403K. INR 1.88 Serum sodium 134, potassium 4.7, current 95, CO2 27, glucose 270, BUN 42, creatinine 2.81, calcium 9.4. Bilirubin 0.2, AP 213, AST 20, ALT 8 and albumin 2.8. BNP 731(0-125). Hemoglobin A1c on 03/01/2013 was 9.1; hemoglobin A1c was 8.3 two years ago. H&H was 12.8 and 40.2 on 10/13/2011. Serum creatinine was 1.37 on 07/21/2011.     Assessment:  #1. Anemia.Patient's anemia is new since September 2013 when her hemoglobin was 12.8 g. MCV is normal. She denies melena or rectal bleeding. Need to rule out deficiency syndrome or occult GI bleed. #2. Diabetes mellitus.Patient's hemoglobin A1c is over 9. She is on single agent she has been on short acting insulin in the past she hasn't used it recently.  #3. Abnormal abdominal exam with fullness and nodularity. Not sure of this nodularity is an abdominal wall or intra-abdominal . #4. Chronic kidney disease. Patient's creatinine has more than doubled since July 2013. She has been using Aleve which may or may not be the reason for worsening renal function. #5. Elevated BNP level.Significance not clear. This will be repeated at some point in if abnormal will consider ECHO. #6. History of colon carcinoma. Status post transverse colostomy for obstructing lesion at splenic flexure in September 2007. She had takedown of colostomy in 2010 indicated by bradycardia controlled colocutaneous fistula. She is overdue for surveillance colonoscopy. #7. Hypothyroidism. She is on replacement therapy.   Plan:  Urinalysis with microscopy. Urine microalbumin. Abdominopelvic CT with oral contrast only. Patient advised to stop Advil and to not take other OTC NSAIDs. Patient will go to the lab for serum iron TIBC ferritin B12 level CEA and TSH level. Humalog via sliding scale. Written instructions provided to the patient. Patient also advised to do Accu-Chek at least twice  daily. Surveillance colonoscopy when acute issues have been addressed. Office visit in 4 weeks.

## 2013-03-07 DIAGNOSIS — N189 Chronic kidney disease, unspecified: Secondary | ICD-10-CM | POA: Insufficient documentation

## 2013-03-07 DIAGNOSIS — D649 Anemia, unspecified: Secondary | ICD-10-CM | POA: Insufficient documentation

## 2013-03-07 LAB — URINALYSIS
BILIRUBIN URINE: NEGATIVE
GLUCOSE, UA: 250 mg/dL — AB
Ketones, ur: NEGATIVE mg/dL
Nitrite: NEGATIVE
Protein, ur: NEGATIVE mg/dL
Specific Gravity, Urine: 1.017 (ref 1.005–1.030)
Urobilinogen, UA: 0.2 mg/dL (ref 0.0–1.0)
pH: 5.5 (ref 5.0–8.0)

## 2013-03-07 LAB — URINALYSIS, MICROSCOPIC ONLY
BACTERIA UA: NONE SEEN
Casts: NONE SEEN

## 2013-03-07 LAB — MICROALBUMIN, URINE: MICROALB UR: 3.52 mg/dL — AB (ref 0.00–1.89)

## 2013-03-07 LAB — TSH: TSH: 6.869 u[IU]/mL — AB (ref 0.350–4.500)

## 2013-03-07 LAB — IRON AND TIBC
%SAT: 9 % — ABNORMAL LOW (ref 20–55)
Iron: 27 ug/dL — ABNORMAL LOW (ref 42–145)
TIBC: 313 ug/dL (ref 250–470)
UIBC: 286 ug/dL (ref 125–400)

## 2013-03-07 LAB — VITAMIN B12: Vitamin B-12: 1078 pg/mL — ABNORMAL HIGH (ref 211–911)

## 2013-03-07 LAB — CEA: CEA: 2.9 ng/mL (ref 0.0–5.0)

## 2013-03-07 LAB — FERRITIN: Ferritin: 60 ng/mL (ref 10–291)

## 2013-03-08 ENCOUNTER — Other Ambulatory Visit (INDEPENDENT_AMBULATORY_CARE_PROVIDER_SITE_OTHER): Payer: Self-pay | Admitting: Internal Medicine

## 2013-03-08 MED ORDER — CIPROFLOXACIN HCL 500 MG PO TABS: 500.0000 mg | ORAL_TABLET | Freq: Two times a day (BID) | ORAL | Status: DC

## 2013-03-08 NOTE — Addendum Note (Signed)
Addended by: Rogene Houston on: 03/08/2013 12:53 PM   Modules accepted: Orders

## 2013-03-09 ENCOUNTER — Ambulatory Visit (HOSPITAL_COMMUNITY)
Admission: RE | Admit: 2013-03-09 | Discharge: 2013-03-09 | Disposition: A | Discharge status: Home / Self Care | Payer: BC Managed Care – PPO | Source: Ambulatory Visit | Attending: Internal Medicine | Admitting: Internal Medicine

## 2013-03-09 ENCOUNTER — Telehealth (INDEPENDENT_AMBULATORY_CARE_PROVIDER_SITE_OTHER): Payer: Self-pay | Admitting: *Deleted

## 2013-03-09 DIAGNOSIS — C189 Malignant neoplasm of colon, unspecified: Secondary | ICD-10-CM

## 2013-03-09 DIAGNOSIS — N1330 Unspecified hydronephrosis: Secondary | ICD-10-CM | POA: Insufficient documentation

## 2013-03-09 DIAGNOSIS — Z86711 Personal history of pulmonary embolism: Secondary | ICD-10-CM

## 2013-03-09 DIAGNOSIS — C772 Secondary and unspecified malignant neoplasm of intra-abdominal lymph nodes: Secondary | ICD-10-CM | POA: Insufficient documentation

## 2013-03-09 DIAGNOSIS — D494 Neoplasm of unspecified behavior of bladder: Secondary | ICD-10-CM | POA: Insufficient documentation

## 2013-03-09 DIAGNOSIS — R1031 Right lower quadrant pain: Secondary | ICD-10-CM | POA: Insufficient documentation

## 2013-03-09 DIAGNOSIS — R198 Other specified symptoms and signs involving the digestive system and abdomen: Secondary | ICD-10-CM

## 2013-03-09 DIAGNOSIS — R1903 Right lower quadrant abdominal swelling, mass and lump: Secondary | ICD-10-CM | POA: Insufficient documentation

## 2013-03-09 DIAGNOSIS — N2889 Other specified disorders of kidney and ureter: Secondary | ICD-10-CM | POA: Insufficient documentation

## 2013-03-09 DIAGNOSIS — R0989 Other specified symptoms and signs involving the circulatory and respiratory systems: Secondary | ICD-10-CM

## 2013-03-09 NOTE — Telephone Encounter (Signed)
Per Dr.Rehman the patient will need to have labs drawn in 2 weeks. This was at her office visit 03/06/13.

## 2013-03-11 ENCOUNTER — Other Ambulatory Visit (INDEPENDENT_AMBULATORY_CARE_PROVIDER_SITE_OTHER): Payer: Self-pay | Admitting: Internal Medicine

## 2013-03-13 NOTE — Progress Notes (Signed)
Patient was seen on 03/06/13 be Dr. Laural Golden.

## 2013-03-15 ENCOUNTER — Other Ambulatory Visit (INDEPENDENT_AMBULATORY_CARE_PROVIDER_SITE_OTHER): Payer: Self-pay | Admitting: *Deleted

## 2013-03-15 ENCOUNTER — Encounter (INDEPENDENT_AMBULATORY_CARE_PROVIDER_SITE_OTHER): Payer: Self-pay | Admitting: *Deleted

## 2013-03-15 DIAGNOSIS — Z86711 Personal history of pulmonary embolism: Secondary | ICD-10-CM

## 2013-03-15 DIAGNOSIS — R0989 Other specified symptoms and signs involving the circulatory and respiratory systems: Secondary | ICD-10-CM

## 2013-03-15 DIAGNOSIS — C189 Malignant neoplasm of colon, unspecified: Secondary | ICD-10-CM

## 2013-03-21 ENCOUNTER — Ambulatory Visit (INDEPENDENT_AMBULATORY_CARE_PROVIDER_SITE_OTHER): Payer: BC Managed Care – PPO | Admitting: Urology

## 2013-03-21 DIAGNOSIS — N1330 Unspecified hydronephrosis: Secondary | ICD-10-CM

## 2013-03-21 DIAGNOSIS — C679 Malignant neoplasm of bladder, unspecified: Secondary | ICD-10-CM

## 2013-03-21 DIAGNOSIS — N179 Acute kidney failure, unspecified: Secondary | ICD-10-CM

## 2013-03-22 ENCOUNTER — Encounter (HOSPITAL_COMMUNITY): Payer: Self-pay | Admitting: Emergency Medicine

## 2013-03-22 ENCOUNTER — Inpatient Hospital Stay (HOSPITAL_COMMUNITY): Payer: BC Managed Care – PPO

## 2013-03-22 ENCOUNTER — Inpatient Hospital Stay (HOSPITAL_COMMUNITY)
Admission: EM | Admit: 2013-03-22 | Discharge: 2013-04-03 | DRG: 657 | Disposition: A | Discharge status: Skilled Nursing Facility | Payer: BC Managed Care – PPO | Attending: Internal Medicine | Admitting: Internal Medicine

## 2013-03-22 DIAGNOSIS — Z8619 Personal history of other infectious and parasitic diseases: Secondary | ICD-10-CM

## 2013-03-22 DIAGNOSIS — D62 Acute posthemorrhagic anemia: Secondary | ICD-10-CM | POA: Diagnosis not present

## 2013-03-22 DIAGNOSIS — Z8614 Personal history of Methicillin resistant Staphylococcus aureus infection: Secondary | ICD-10-CM

## 2013-03-22 DIAGNOSIS — I82509 Chronic embolism and thrombosis of unspecified deep veins of unspecified lower extremity: Secondary | ICD-10-CM | POA: Diagnosis present

## 2013-03-22 DIAGNOSIS — N139 Obstructive and reflux uropathy, unspecified: Secondary | ICD-10-CM | POA: Diagnosis present

## 2013-03-22 DIAGNOSIS — N189 Chronic kidney disease, unspecified: Secondary | ICD-10-CM | POA: Diagnosis present

## 2013-03-22 DIAGNOSIS — Z86711 Personal history of pulmonary embolism: Secondary | ICD-10-CM | POA: Diagnosis present

## 2013-03-22 DIAGNOSIS — D494 Neoplasm of unspecified behavior of bladder: Secondary | ICD-10-CM

## 2013-03-22 DIAGNOSIS — C679 Malignant neoplasm of bladder, unspecified: Principal | ICD-10-CM | POA: Diagnosis present

## 2013-03-22 DIAGNOSIS — A498 Other bacterial infections of unspecified site: Secondary | ICD-10-CM | POA: Diagnosis present

## 2013-03-22 DIAGNOSIS — D649 Anemia, unspecified: Secondary | ICD-10-CM

## 2013-03-22 DIAGNOSIS — Z8249 Family history of ischemic heart disease and other diseases of the circulatory system: Secondary | ICD-10-CM

## 2013-03-22 DIAGNOSIS — Z85038 Personal history of other malignant neoplasm of large intestine: Secondary | ICD-10-CM

## 2013-03-22 DIAGNOSIS — I509 Heart failure, unspecified: Secondary | ICD-10-CM | POA: Diagnosis present

## 2013-03-22 DIAGNOSIS — N1330 Unspecified hydronephrosis: Secondary | ICD-10-CM | POA: Diagnosis present

## 2013-03-22 DIAGNOSIS — J449 Chronic obstructive pulmonary disease, unspecified: Secondary | ICD-10-CM | POA: Diagnosis present

## 2013-03-22 DIAGNOSIS — K044 Acute apical periodontitis of pulpal origin: Secondary | ICD-10-CM | POA: Diagnosis present

## 2013-03-22 DIAGNOSIS — F17200 Nicotine dependence, unspecified, uncomplicated: Secondary | ICD-10-CM

## 2013-03-22 DIAGNOSIS — I10 Essential (primary) hypertension: Secondary | ICD-10-CM | POA: Diagnosis present

## 2013-03-22 DIAGNOSIS — N135 Crossing vessel and stricture of ureter without hydronephrosis: Secondary | ICD-10-CM | POA: Diagnosis present

## 2013-03-22 DIAGNOSIS — E039 Hypothyroidism, unspecified: Secondary | ICD-10-CM | POA: Diagnosis present

## 2013-03-22 DIAGNOSIS — C779 Secondary and unspecified malignant neoplasm of lymph node, unspecified: Secondary | ICD-10-CM | POA: Diagnosis present

## 2013-03-22 DIAGNOSIS — I129 Hypertensive chronic kidney disease with stage 1 through stage 4 chronic kidney disease, or unspecified chronic kidney disease: Secondary | ICD-10-CM | POA: Diagnosis present

## 2013-03-22 DIAGNOSIS — I5032 Chronic diastolic (congestive) heart failure: Secondary | ICD-10-CM | POA: Diagnosis present

## 2013-03-22 DIAGNOSIS — Z7901 Long term (current) use of anticoagulants: Secondary | ICD-10-CM

## 2013-03-22 DIAGNOSIS — Z794 Long term (current) use of insulin: Secondary | ICD-10-CM

## 2013-03-22 DIAGNOSIS — I2782 Chronic pulmonary embolism: Secondary | ICD-10-CM | POA: Diagnosis present

## 2013-03-22 DIAGNOSIS — N179 Acute kidney failure, unspecified: Secondary | ICD-10-CM

## 2013-03-22 DIAGNOSIS — I519 Heart disease, unspecified: Secondary | ICD-10-CM | POA: Diagnosis present

## 2013-03-22 DIAGNOSIS — Z66 Do not resuscitate: Secondary | ICD-10-CM | POA: Diagnosis present

## 2013-03-22 DIAGNOSIS — K5900 Constipation, unspecified: Secondary | ICD-10-CM | POA: Diagnosis present

## 2013-03-22 DIAGNOSIS — E1129 Type 2 diabetes mellitus with other diabetic kidney complication: Secondary | ICD-10-CM | POA: Diagnosis present

## 2013-03-22 DIAGNOSIS — S31109A Unspecified open wound of abdominal wall, unspecified quadrant without penetration into peritoneal cavity, initial encounter: Secondary | ICD-10-CM | POA: Diagnosis present

## 2013-03-22 DIAGNOSIS — D500 Iron deficiency anemia secondary to blood loss (chronic): Secondary | ICD-10-CM | POA: Diagnosis present

## 2013-03-22 DIAGNOSIS — E119 Type 2 diabetes mellitus without complications: Secondary | ICD-10-CM | POA: Diagnosis present

## 2013-03-22 DIAGNOSIS — C189 Malignant neoplasm of colon, unspecified: Secondary | ICD-10-CM

## 2013-03-22 DIAGNOSIS — J4489 Other specified chronic obstructive pulmonary disease: Secondary | ICD-10-CM | POA: Diagnosis present

## 2013-03-22 DIAGNOSIS — R079 Chest pain, unspecified: Secondary | ICD-10-CM | POA: Diagnosis not present

## 2013-03-22 DIAGNOSIS — Z79899 Other long term (current) drug therapy: Secondary | ICD-10-CM

## 2013-03-22 DIAGNOSIS — N390 Urinary tract infection, site not specified: Secondary | ICD-10-CM | POA: Diagnosis present

## 2013-03-22 LAB — URINE MICROSCOPIC-ADD ON

## 2013-03-22 LAB — URINALYSIS, ROUTINE W REFLEX MICROSCOPIC
Bilirubin Urine: NEGATIVE
Glucose, UA: NEGATIVE mg/dL
Ketones, ur: NEGATIVE mg/dL
NITRITE: POSITIVE — AB
PH: 6 (ref 5.0–8.0)
Protein, ur: 30 mg/dL — AB
SPECIFIC GRAVITY, URINE: 1.015 (ref 1.005–1.030)
Urobilinogen, UA: 0.2 mg/dL (ref 0.0–1.0)

## 2013-03-22 LAB — BASIC METABOLIC PANEL
BUN: 53 mg/dL — ABNORMAL HIGH (ref 6–23)
CALCIUM: 9.6 mg/dL (ref 8.4–10.5)
CO2: 22 mEq/L (ref 19–32)
Chloride: 98 mEq/L (ref 96–112)
Creatinine, Ser: 3.4 mg/dL — ABNORMAL HIGH (ref 0.50–1.10)
GFR calc non Af Amer: 12 mL/min — ABNORMAL LOW (ref >90)
GFR, EST AFRICAN AMERICAN: 14 mL/min — AB (ref >90)
GLUCOSE: 101 mg/dL — AB (ref 70–99)
Potassium: 5 mEq/L (ref 3.7–5.3)
SODIUM: 136 meq/L — AB (ref 137–147)

## 2013-03-22 LAB — I-STAT CHEM 8, ED
BUN: 63 mg/dL — ABNORMAL HIGH (ref 6–23)
CALCIUM ION: 1.05 mmol/L — AB (ref 1.13–1.30)
Chloride: 106 mEq/L (ref 96–112)
Creatinine, Ser: 3.7 mg/dL — ABNORMAL HIGH (ref 0.50–1.10)
Glucose, Bld: 98 mg/dL (ref 70–99)
HEMATOCRIT: 33 % — AB (ref 36.0–46.0)
HEMOGLOBIN: 11.2 g/dL — AB (ref 12.0–15.0)
Potassium: 5.6 mEq/L — ABNORMAL HIGH (ref 3.7–5.3)
Sodium: 137 mEq/L (ref 137–147)
TCO2: 25 mmol/L (ref 0–100)

## 2013-03-22 LAB — CBC WITH DIFFERENTIAL/PLATELET
Basophils Absolute: 0.1 10*3/uL (ref 0.0–0.1)
Basophils Relative: 1 % (ref 0–1)
Eosinophils Absolute: 0.3 10*3/uL (ref 0.0–0.7)
Eosinophils Relative: 3 % (ref 0–5)
HEMATOCRIT: 31.2 % — AB (ref 36.0–46.0)
Hemoglobin: 10 g/dL — ABNORMAL LOW (ref 12.0–15.0)
LYMPHS PCT: 26 % (ref 12–46)
Lymphs Abs: 2.6 10*3/uL (ref 0.7–4.0)
MCH: 27.2 pg (ref 26.0–34.0)
MCHC: 32.1 g/dL (ref 30.0–36.0)
MCV: 85 fL (ref 78.0–100.0)
MONO ABS: 0.5 10*3/uL (ref 0.1–1.0)
Monocytes Relative: 5 % (ref 3–12)
NEUTROS ABS: 6.5 10*3/uL (ref 1.7–7.7)
Neutrophils Relative %: 66 % (ref 43–77)
Platelets: 286 10*3/uL (ref 150–400)
RBC: 3.67 MIL/uL — AB (ref 3.87–5.11)
RDW: 17 % — ABNORMAL HIGH (ref 11.5–15.5)
WBC: 9.9 10*3/uL (ref 4.0–10.5)

## 2013-03-22 LAB — GLUCOSE, CAPILLARY: Glucose-Capillary: 117 mg/dL — ABNORMAL HIGH (ref 70–99)

## 2013-03-22 LAB — HEPATIC FUNCTION PANEL
ALT: 10 U/L (ref 0–35)
AST: 23 U/L (ref 0–37)
Albumin: 3 g/dL — ABNORMAL LOW (ref 3.5–5.2)
Alkaline Phosphatase: 244 U/L — ABNORMAL HIGH (ref 39–117)
Bilirubin, Direct: <0.2 mg/dL (ref 0.0–0.3)
TOTAL PROTEIN: 8.5 g/dL — AB (ref 6.0–8.3)
Total Bilirubin: <0.2 mg/dL — ABNORMAL LOW (ref 0.3–1.2)

## 2013-03-22 LAB — PROTIME-INR
INR: 2.2 — AB (ref 0.00–1.49)
Prothrombin Time: 23.7 seconds — ABNORMAL HIGH (ref 11.6–15.2)

## 2013-03-22 MED ORDER — EZETIMIBE 10 MG PO TABS
10.0000 mg | ORAL_TABLET | Freq: Every day | ORAL | Status: DC
  Administered 2013-03-23 – 2013-04-03 (×12): 10 mg via ORAL
  Filled 2013-03-22 (×12): qty 1

## 2013-03-22 MED ORDER — IPRATROPIUM BROMIDE 0.02 % IN SOLN: 0.5000 mg | RESPIRATORY_TRACT | Status: DC

## 2013-03-22 MED ORDER — FERROUS SULFATE 325 (65 FE) MG PO TABS
325.0000 mg | ORAL_TABLET | Freq: Every day | ORAL | Status: DC
  Administered 2013-03-23 – 2013-04-03 (×10): 325 mg via ORAL
  Filled 2013-03-22 (×13): qty 1

## 2013-03-22 MED ORDER — LEVOTHYROXINE SODIUM 112 MCG PO TABS
112.0000 ug | ORAL_TABLET | Freq: Every day | ORAL | Status: DC
  Administered 2013-03-23 – 2013-04-03 (×12): 112 ug via ORAL
  Filled 2013-03-22 (×14): qty 1

## 2013-03-22 MED ORDER — ONDANSETRON HCL 4 MG/2ML IJ SOLN: 4.0000 mg | Freq: Four times a day (QID) | INTRAMUSCULAR | Status: DC | PRN

## 2013-03-22 MED ORDER — ONDANSETRON HCL 4 MG/2ML IJ SOLN
4.0000 mg | Freq: Once | INTRAMUSCULAR | Status: AC
  Administered 2013-03-22: 4 mg via INTRAVENOUS
  Filled 2013-03-22: qty 2

## 2013-03-22 MED ORDER — INSULIN ASPART 100 UNIT/ML ~~LOC~~ SOLN: 0.0000 [IU] | Freq: Every day | SUBCUTANEOUS | Status: DC

## 2013-03-22 MED ORDER — ACETAMINOPHEN 325 MG PO TABS
650.0000 mg | ORAL_TABLET | Freq: Four times a day (QID) | ORAL | Status: DC | PRN
  Administered 2013-03-27: 650 mg via ORAL
  Filled 2013-03-22: qty 2

## 2013-03-22 MED ORDER — MORPHINE SULFATE 2 MG/ML IJ SOLN
2.0000 mg | Freq: Once | INTRAMUSCULAR | Status: AC
  Administered 2013-03-22: 2 mg via INTRAVENOUS
  Filled 2013-03-22: qty 1

## 2013-03-22 MED ORDER — HYDROCODONE-ACETAMINOPHEN 5-325 MG PO TABS
1.0000 | ORAL_TABLET | ORAL | Status: DC | PRN
  Administered 2013-03-23: 1 via ORAL
  Administered 2013-03-23: 2 via ORAL
  Administered 2013-03-25: 1 via ORAL
  Administered 2013-03-26 – 2013-04-01 (×6): 2 via ORAL
  Filled 2013-03-22 (×4): qty 2
  Filled 2013-03-22: qty 1
  Filled 2013-03-22 (×2): qty 2
  Filled 2013-03-22: qty 1
  Filled 2013-03-22: qty 2

## 2013-03-22 MED ORDER — ALBUTEROL SULFATE (2.5 MG/3ML) 0.083% IN NEBU: 2.5000 mg | INHALATION_SOLUTION | RESPIRATORY_TRACT | Status: DC

## 2013-03-22 MED ORDER — METOPROLOL SUCCINATE ER 50 MG PO TB24
50.0000 mg | ORAL_TABLET | Freq: Every day | ORAL | Status: DC
  Administered 2013-03-23 – 2013-04-03 (×11): 50 mg via ORAL
  Filled 2013-03-22 (×12): qty 1

## 2013-03-22 MED ORDER — INSULIN GLARGINE 100 UNIT/ML ~~LOC~~ SOLN
15.0000 [IU] | Freq: Every day | SUBCUTANEOUS | Status: DC
  Administered 2013-03-23 – 2013-03-24 (×3): 15 [IU] via SUBCUTANEOUS
  Filled 2013-03-22 (×5): qty 0.15

## 2013-03-22 MED ORDER — CLONIDINE HCL 0.1 MG PO TABS
0.1000 mg | ORAL_TABLET | Freq: Two times a day (BID) | ORAL | Status: DC
  Administered 2013-03-23 – 2013-04-02 (×22): 0.1 mg via ORAL
  Filled 2013-03-22 (×24): qty 1

## 2013-03-22 MED ORDER — INSULIN ASPART 100 UNIT/ML ~~LOC~~ SOLN: 0.0000 [IU] | Freq: Three times a day (TID) | SUBCUTANEOUS | Status: DC

## 2013-03-22 MED ORDER — ACETAMINOPHEN 650 MG RE SUPP: 650.0000 mg | Freq: Four times a day (QID) | RECTAL | Status: DC | PRN

## 2013-03-22 MED ORDER — ONDANSETRON HCL 4 MG PO TABS: 4.0000 mg | ORAL_TABLET | Freq: Four times a day (QID) | ORAL | Status: DC | PRN

## 2013-03-22 MED ORDER — MORPHINE SULFATE 4 MG/ML IJ SOLN: 4.0000 mg | Freq: Once | INTRAMUSCULAR | Status: DC

## 2013-03-22 NOTE — ED Notes (Signed)
inpt md at bs for eval.

## 2013-03-22 NOTE — Progress Notes (Signed)
ANTICOAGULATION CONSULT NOTE - Initial Consult  Pharmacy Consult for Heparin Indication: History of DVT/PE  Allergies  Allergen Reactions  . Motrin [Ibuprofen] Rash  . Sulfa Drugs Cross Reactors Rash  . Iohexol      Code: HIVES, Desc: pt states she has dye allergy, Onset Date: [id number]   . Quinine Derivatives Rash    Patient Measurements:   Heparin Dosing Weight:   Vital Signs: Temp: 97.3 F (36.3 C) (03/04 2001) Temp src: Oral (03/04 2001) BP: 138/71 mmHg (03/04 2001) Pulse Rate: 93 (03/04 2001)  Labs:  Recent Labs  03/22/13 1650 03/22/13 1800 03/22/13 1803  HGB 10.0* 11.2*  --   HCT 31.2* 33.0*  --   PLT 286  --   --   LABPROT 23.7*  --   --   INR 2.20*  --   --   CREATININE  --  3.70* 3.40*    The CrCl is unknown because both a height and weight (above a minimum accepted value) are required for this calculation.   Medical History: Past Medical History  Diagnosis Date  . Diabetes mellitus   . Hypertension   . Thyroid disease   . Colon cancer   . COPD (chronic obstructive pulmonary disease)   . MRSA infection   . Pulmonary embolus   . Deep vein thrombosis   . Pneumonia   . Rheumatic fever   . Dehydration   . MRSA (methicillin resistant staph aureus) culture positive   . Arthritis   . History of shingles 02/2010  . 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 Plan is for IR placement of nephrostomy tubes and eventual TURBT. Pharmacy is asked to transition to heparin. INR is in therapeutic range today.  Goal of Therapy:  Heparin level 0.3-0.7 units/ml Monitor platelets by anticoagulation protocol: Yes   Plan:   Daily PT/INRs.  Check baseline aPTT in the am.   Pharmacy will start heparin when INR is <2.  Obtain updated height and weight.  Romeo Rabon, PharmD, pager  [phone]. 03/22/2013,8:38 PM.

## 2013-03-22 NOTE — Consult Note (Signed)
Urology Consult   Physician requesting consult: Dr. Criss Alvine  Reason for consult: Bilateral hydronephrosis, bladder tumor  History of Present Illness: Heather [name] is a 73 y.o. patient of Dr. Retta Diones who was evaluated yesterday in [place] for bilateral hydronephrosis and hematuria.  She was noted to have a bladder tumor on cystoscopy and her CT scan demonstrated what appeared to be metastatic lymphadenopathy with severe bilateral hydronephrosis.  She had labs drawn yesterday and her renal function was noted to be significantly impaired with a serum Cr of 3.7 (baseline unknown).  Dr. Retta Diones recommended that she present to the ED based on her acute renal failure for further evaluation and treatment of her hydronephrosis on a more urgent basis.  She has had bilateral flank pain controlled with pain medication.  She denies nausea or vomiting or fever.    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 She last took Coumadin 2 nights ago.   Past Medical History  Diagnosis Date  . Diabetes mellitus   . Hypertension   . Thyroid disease   . Colon cancer   . COPD (chronic obstructive pulmonary disease)   . MRSA infection   . Pulmonary embolus   . Deep vein thrombosis   . Pneumonia   . Rheumatic fever   . Dehydration   . MRSA (methicillin resistant staph aureus) culture positive   . Arthritis   . History of shingles 02/2010  . Colon cancer     Past Surgical History  Procedure Laterality Date  . Back surgery    . Partial hysterectomy    . Cholecystectomy    . Colostomy takedown    . Arthroscopic repair acl    . Hernia repair    . Breast cyst excision    . Gallbladder surgery    . Back surgery    . Bile duct exploration    . Bladder tact      '70s  . Cystitis    . Port-a-cath removal    . Abcess drainage      abdominal wall     Current Hospital Medications:  Home meds:    Medication List    ASK your doctor about these medications        acetaminophen 500 MG tablet  Commonly known as:  TYLENOL  Take 1,000 mg by mouth every 8 (eight) hours as needed.     Calcium Carbonate-Vit D-Min 600-200 MG-UNIT Tabs  Take 1 tablet by mouth daily.     ciprofloxacin 500 MG tablet  Commonly known as:  CIPRO  Take 1 tablet (500 mg total) by mouth 2 (two) times daily.     cloNIDine 0.1 MG tablet  Commonly known as:  CATAPRES  Take 0.1 mg by mouth 2 (two) times daily.     D3 ADULT PO  Take 1,000 Units by mouth daily.     ezetimibe 10 MG tablet  Commonly known as:  ZETIA  Take 10 mg by mouth daily.     ferrous sulfate 325 (65 FE) MG tablet  Take 325 mg by mouth daily with breakfast.     furosemide 40 MG tablet  Commonly known as:  LASIX  Take 40 mg by mouth every other day.     insulin glargine 100 UNIT/ML injection  Commonly known as:  LANTUS  Inject 30 Units into the skin at bedtime.     insulin lispro 100 UNIT/ML injection  Commonly known as:  HUMALOG  Inject according to sliding  scale up to three times per day.     levothyroxine 112 MCG tablet  Commonly known as:  LEVOTHROID  Take 1 tablet (112 mcg total) by mouth daily before breakfast.     metoprolol succinate 50 MG 24 hr tablet  Commonly known as:  TOPROL-XL  Take 50 mg by mouth 2 (two) times daily. Take with or immediately following a meal.     ondansetron 4 MG disintegrating tablet  Commonly known as:  ZOFRAN ODT  Take 1 tablet (4 mg total) by mouth every 8 (eight) hours as needed for nausea or vomiting.     potassium chloride 20 MEQ packet  Commonly known as:  KLOR-CON  Take 20 mEq by mouth daily as needed (take only if furosemide has been taken).     warfarin 3 MG tablet  Commonly known as:  COUMADIN  Take 1 tablet (3 mg total) by mouth daily. Take 1 tablet daily except take 1-1/2 tablets every Monday and Thursday     WOMENS MULTIVITAMIN PLUS PO  Take 1 capsule by mouth.        Scheduled Meds: Continuous Infusions: PRN Meds:.    Allergies:   Allergies  Allergen Reactions  . Motrin [Ibuprofen] Rash  . Sulfa Drugs Cross Reactors Rash  . Iohexol      Code: HIVES, Desc: pt states she has dye allergy, Onset Date: 41660630   . Quinine Derivatives Rash    Family History  Problem Relation Age of Onset  . Heart disease Sister   . Heart disease Brother   . Healthy Daughter   . Healthy Son   . Heart disease Mother   . Heart disease Father     Social History:  reports that she has been smoking Cigarettes.  She has a 35.25 pack-year smoking history. She has never used smokeless tobacco. She reports that she does not drink alcohol or use illicit drugs.  ROS: A complete review of systems was performed.  All systems are negative except for pertinent findings as noted.  Physical Exam:  Vital signs in last 24 hours: Temp:  [97.7 F (36.5 C)-98.3 F (36.8 C)] 98.3 F (36.8 C) (03/04 1806) Pulse Rate:  [81-90] 86 (03/04 1806) Resp:  [16-20] 20 (03/04 1806) BP: (122-158)/(64-70) 158/68 mmHg (03/04 1806) SpO2:  [80 %-97 %] 94 % (03/04 1806) Constitutional:  Alert and oriented, No acute distress Cardiovascular: Regular rate and rhythm, No JVD, She has pitting bilateral lower extremity edema Respiratory: Normal respiratory effort, Lungs clear bilaterally GI: Abdomen is soft, nontender, nondistended, no abdominal masses GU: Moderate bilateral CVA tenderness Lymphatic: No lymphadenopathy Neurologic: Grossly intact, no focal deficits Psychiatric: Normal mood and affect  Laboratory Data:   Recent Labs  03/22/13 1650 03/22/13 1800  WBC 9.9  --   HGB 10.0* 11.2*  HCT 31.2* 33.0*  PLT 286  --      Recent Labs  03/22/13 1800 03/22/13 1803  NA 137 136*  K 5.6* 5.0  CL 106 PENDING  GLUCOSE 98 101*  BUN 63* 53*  CALCIUM  --  9.6  CREATININE 3.70* 3.40*     Results for orders placed during the hospital encounter of 03/22/13 (from the past 24 hour(s))  CBC WITH DIFFERENTIAL     Status: Abnormal   Collection Time     03/22/13  4:50 PM      Result Value Ref Range   WBC 9.9  4.0 - 10.5 K/uL   RBC 3.67 (*) 3.87 - 5.11 MIL/uL  Hemoglobin 10.0 (*) 12.0 - 15.0 g/dL   HCT 31.2 (*) 36.0 - 46.0 %   MCV 85.0  78.0 - 100.0 fL   MCH 27.2  26.0 - 34.0 pg   MCHC 32.1  30.0 - 36.0 g/dL   RDW 17.0 (*) 11.5 - 15.5 %   Platelets 286  150 - 400 K/uL   Neutrophils Relative % 66  43 - 77 %   Neutro Abs 6.5  1.7 - 7.7 K/uL   Lymphocytes Relative 26  12 - 46 %   Lymphs Abs 2.6  0.7 - 4.0 K/uL   Monocytes Relative 5  3 - 12 %   Monocytes Absolute 0.5  0.1 - 1.0 K/uL   Eosinophils Relative 3  0 - 5 %   Eosinophils Absolute 0.3  0.0 - 0.7 K/uL   Basophils Relative 1  0 - 1 %   Basophils Absolute 0.1  0.0 - 0.1 K/uL  PROTIME-INR     Status: Abnormal   Collection Time    03/22/13  4:50 PM      Result Value Ref Range   Prothrombin Time 23.7 (*) 11.6 - 15.2 seconds   INR 2.20 (*) 0.00 - 1.49  URINALYSIS, ROUTINE W REFLEX MICROSCOPIC     Status: Abnormal   Collection Time    03/22/13  5:02 PM      Result Value Ref Range   Color, Urine YELLOW  YELLOW   APPearance CLOUDY (*) CLEAR   Specific Gravity, Urine 1.015  1.005 - 1.030   pH 6.0  5.0 - 8.0   Glucose, UA NEGATIVE  NEGATIVE mg/dL   Hgb urine dipstick LARGE (*) NEGATIVE   Bilirubin Urine NEGATIVE  NEGATIVE   Ketones, ur NEGATIVE  NEGATIVE mg/dL   Protein, ur 30 (*) NEGATIVE mg/dL   Urobilinogen, UA 0.2  0.0 - 1.0 mg/dL   Nitrite POSITIVE (*) NEGATIVE   Leukocytes, UA LARGE (*) NEGATIVE  URINE MICROSCOPIC-ADD ON     Status: Abnormal   Collection Time    03/22/13  5:02 PM      Result Value Ref Range   Squamous Epithelial / LPF FEW (*) RARE   WBC, UA TOO NUMEROUS TO COUNT  <3 WBC/hpf   RBC / HPF TOO NUMEROUS TO COUNT  <3 RBC/hpf   Bacteria, UA FEW (*) RARE  I-STAT CHEM 8, ED     Status: Abnormal   Collection Time    03/22/13  6:00 PM      Result Value Ref Range   Sodium 137  137 - 147 mEq/L   Potassium 5.6 (*) 3.7 - 5.3 mEq/L   Chloride 106  96 - 112  mEq/L   BUN 63 (*) 6 - 23 mg/dL   Creatinine, Ser 3.70 (*) 0.50 - 1.10 mg/dL   Glucose, Bld 98  70 - 99 mg/dL   Calcium, Ion 1.05 (*) 1.13 - 1.30 mmol/L   TCO2 25  0 - 100 mmol/L   Hemoglobin 11.2 (*) 12.0 - 15.0 g/dL   HCT 33.0 (*) 36.0 - 46.0 %  HEPATIC FUNCTION PANEL     Status: Abnormal   Collection Time    03/22/13  6:03 PM      Result Value Ref Range   Total Protein 8.5 (*) 6.0 - 8.3 g/dL   Albumin 3.0 (*) 3.5 - 5.2 g/dL   AST 23  0 - 37 U/L   ALT 10  0 - 35 U/L   Alkaline Phosphatase 244 (*)  39 - 117 U/L   Total Bilirubin <0.2 (*) 0.3 - 1.2 mg/dL   Bilirubin, Direct <0.2  0.0 - 0.3 mg/dL   Indirect Bilirubin NOT CALCULATED  0.3 - 0.9 mg/dL  BASIC METABOLIC PANEL     Status: Abnormal (Preliminary result)   Collection Time    03/22/13  6:03 PM      Result Value Ref Range   Sodium 136 (*) 137 - 147 mEq/L   Potassium 5.0  3.7 - 5.3 mEq/L   Chloride PENDING  96 - 112 mEq/L   CO2 22  19 - 32 mEq/L   Glucose, Bld 101 (*) 70 - 99 mg/dL   BUN 53 (*) 6 - 23 mg/dL   Creatinine, Ser 3.40 (*) 0.50 - 1.10 mg/dL   Calcium 9.6  8.4 - 10.5 mg/dL   GFR calc non Af Amer 12 (*) >90 mL/min   GFR calc Af Amer 14 (*) >90 mL/min   No results found for this or any previous visit (from the past 240 hour(s)).   Urine culture has been sent.  Renal Function:  Recent Labs  03/22/13 1800 03/22/13 1803  CREATININE 3.70* 3.40*   The CrCl is unknown because both a height and weight (above a minimum accepted value) are required for this calculation.  Radiologic Imaging: No results found.  I independently reviewed the above imaging studies.  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  I have spoken with Dr. Diona Fanti who has recommended hospital admission by the [facility] service for management of her multiple medical problems and to manage her anticoagulation.  She will need to stop her  Coumadin and may be treated with Lovenox or heparin for bridging therapy in the meantime with plans to have IR place bilateral nephrostomy tubes once her INR is appropriately low.  Once her renal function is optimized and her medical problems are optimized, Dr. Diona Fanti plans to proceed with cystoscopy and transurethral resection of her bladder tumor and further evaluation of the causes for her ureteral obstruction.  Koy Lamp,LES 03/22/2013, 6:36 PM  Pryor Curia. MD   CC: Dr. Regenia Skeeter

## 2013-03-22 NOTE — ED Notes (Addendum)
Dr. Posey Pronto paged with bladder scan results per verbal request, 152ml per.

## 2013-03-22 NOTE — ED Provider Notes (Signed)
CSN: [id number]     Arrival date & time 03/22/13  1139 History   First MD Initiated Contact with Patient 03/22/13 1526     Chief Complaint  Patient presents with  . Back Pain     (Consider location/radiation/quality/duration/timing/severity/associated sxs/prior Treatment) The history is provided by the patient, medical records and a relative.   Patient presents at the request of the urologist who the patient understands asked her to come to the hospital today for surgery.  The patient has a hand written note explaining that she has bladder cancer and probable obstruction of ureters from the cancer as well as acute renal failure.    Per Dr Alinda Money, patient has bladder tumor on left and another mass and lymphadenopathy obstructing right ureter - unclear if this mass is related to existing bladder tumor.  Creatinine and potassium found to be abnormal from yesterday's labs.   Pt has no complaints.  States she has chronic back pain that is unchanged.  States she is able to urinate.  Denies fevers, abdominal pain, vomiting.  Denies dysuria, urinary frequency or urgency.     Past Medical History  Diagnosis Date  . Diabetes mellitus   . Hypertension   . Thyroid disease   . Colon cancer   . COPD (chronic obstructive pulmonary disease)   . MRSA infection   . Pulmonary embolus   . Deep vein thrombosis   . Pneumonia   . Rheumatic fever   . Dehydration   . MRSA (methicillin resistant staph aureus) culture positive   . Arthritis   . History of shingles 02/2010  . Colon cancer    Past Surgical History  Procedure Laterality Date  . Back surgery    . Partial hysterectomy    . Cholecystectomy    . Colostomy takedown    . Arthroscopic repair acl    . Hernia repair    . Breast cyst excision    . Gallbladder surgery    . Back surgery    . Bile duct exploration    . Bladder tact      '70s  . Cystitis    . Port-a-cath removal    . Abcess drainage      abdominal wall   Family History   Problem Relation Age of Onset  . Heart disease Sister   . Heart disease Brother   . Healthy Daughter   . Healthy Son   . Heart disease Mother   . Heart disease Father    History  Substance Use Topics  . Smoking status: Current Every Day Smoker -- 0.75 packs/day for 47 years    Types: Cigarettes  . Smokeless tobacco: Never Used     Comment: Patient smokes less than a pack a day  . Alcohol Use: No   OB History   Grav Para Term Preterm Abortions TAB SAB Ect Mult Living                 Review of Systems  Constitutional: Negative for fever.  Respiratory: Negative for cough and shortness of breath.   Cardiovascular: Negative for chest pain.  Gastrointestinal: Negative for nausea, vomiting, abdominal pain and diarrhea.       Notes chronic soreness to palpation x 7-8 years, unchanged  Genitourinary: Negative for dysuria, urgency, frequency and difficulty urinating.  All other systems reviewed and are negative.      Allergies  Motrin; Sulfa drugs cross reactors; Iohexol; and Quinine derivatives  Home Medications   Current Outpatient Rx  Name  Route  Sig  Dispense  Refill  . acetaminophen (TYLENOL) 500 MG tablet   Oral   Take 500 mg by mouth every 8 (eight) hours as needed.         . Calcium Carbonate-Vit D-Min 600-200 MG-UNIT TABS   Oral   Take 1 tablet by mouth daily.         . Cholecalciferol (D3 ADULT PO)   Oral   Take 1,000 Units by mouth daily.          . ciprofloxacin (CIPRO) 500 MG tablet   Oral   Take 1 tablet (500 mg total) by mouth 2 (two) times daily.   14 tablet   0   . cloNIDine (CATAPRES) 0.1 MG tablet   Oral   Take 0.1 mg by mouth 2 (two) times daily.         Marland Kitchen ezetimibe (ZETIA) 10 MG tablet   Oral   Take 10 mg by mouth daily.         . furosemide (LASIX) 40 MG tablet   Oral   Take 40 mg by mouth daily as needed.          . insulin glargine (LANTUS) 100 UNIT/ML injection   Subcutaneous   Inject 30 Units into the skin at  bedtime.         . insulin lispro (HUMALOG) 100 UNIT/ML cartridge      Sliding scale coverage; instructions provided to the patient the   15 mL   11   . levothyroxine (LEVOTHROID) 112 MCG tablet   Oral   Take 1 tablet (112 mcg total) by mouth daily before breakfast.   30 tablet   3   . levothyroxine (SYNTHROID, LEVOTHROID) 112 MCG tablet      TAKE 1 TABLET DAILY BEFORE BREAKFAST   90 tablet   2   . metoprolol succinate (TOPROL-XL) 50 MG 24 hr tablet   Oral   Take 50 mg by mouth 2 (two) times daily. Take with or immediately following a meal.         . Multiple Vitamins-Minerals (WOMENS MULTIVITAMIN PLUS PO)   Oral   Take 1 capsule by mouth.         . ondansetron (ZOFRAN ODT) 4 MG disintegrating tablet   Oral   Take 1 tablet (4 mg total) by mouth every 8 (eight) hours as needed for nausea or vomiting.   20 tablet   0   . potassium chloride (KLOR-CON) 20 MEQ packet   Oral   Take 20 mEq by mouth daily as needed.          . warfarin (COUMADIN) 3 MG tablet   Oral   Take 1 tablet (3 mg total) by mouth daily. Take 1 tablet daily except take 1-1/2 tablets every Monday and Thursday   34 tablet   5    BP 122/70  Pulse 90  Temp(Src) 97.7 F (36.5 C) (Oral)  Resp 16  SpO2 97% Physical Exam  Nursing note and vitals reviewed. Constitutional: She appears well-developed and well-nourished. No distress.  HENT:  Head: Normocephalic and atraumatic.  Neck: Neck supple.  Cardiovascular: Normal rate and regular rhythm.   Pulmonary/Chest: Effort normal. No respiratory distress. She has wheezes. She has no rales.  Expiratory wheeze  Abdominal: Soft. She exhibits no distension. There is tenderness. There is no rebound and no guarding.  Generalized, R>L, pt states this is chronic and unchanged x years  Musculoskeletal: She exhibits edema.  Bilateral lower extremity edema, nonpitting  Neurological: She is alert.  Skin: She is not diaphoretic.  Psychiatric: She has a  normal mood and affect. Her behavior is normal.    ED Course  Procedures (including critical care time) Labs Review Labs Reviewed  CBC WITH DIFFERENTIAL - Abnormal; Notable for the following:    RBC 3.67 (*)    Hemoglobin 10.0 (*)    HCT 31.2 (*)    RDW 17.0 (*)    All other components within normal limits  URINALYSIS, ROUTINE W REFLEX MICROSCOPIC - Abnormal; Notable for the following:    APPearance CLOUDY (*)    Hgb urine dipstick LARGE (*)    Protein, ur 30 (*)    Nitrite POSITIVE (*)    Leukocytes, UA LARGE (*)    All other components within normal limits  PROTIME-INR - Abnormal; Notable for the following:    Prothrombin Time 23.7 (*)    INR 2.20 (*)    All other components within normal limits  HEPATIC FUNCTION PANEL - Abnormal; Notable for the following:    Total Protein 8.5 (*)    Albumin 3.0 (*)    Alkaline Phosphatase 244 (*)    Total Bilirubin <0.2 (*)    All other components within normal limits  URINE MICROSCOPIC-ADD ON - Abnormal; Notable for the following:    Squamous Epithelial / LPF FEW (*)    Bacteria, UA FEW (*)    All other components within normal limits  BASIC METABOLIC PANEL - Abnormal; Notable for the following:    Sodium 136 (*)    Glucose, Bld 101 (*)    BUN 53 (*)    Creatinine, Ser 3.40 (*)    GFR calc non Af Amer 12 (*)    GFR calc Af Amer 14 (*)    All other components within normal limits  I-STAT CHEM 8, ED - Abnormal; Notable for the following:    Potassium 5.6 (*)    BUN 63 (*)    Creatinine, Ser 3.70 (*)    Calcium, Ion 1.05 (*)    Hemoglobin 11.2 (*)    HCT 33.0 (*)    All other components within normal limits  PROTIME-INR  CBC  APTT  I-STAT CHEM 8, ED   Imaging Review Dg Chest 2 View  03/22/2013   CLINICAL DATA:  Wheezing, shortness of breath  EXAM: CHEST  2 VIEW  COMPARISON:  CT ABD/PELV WO CM dated 03/09/2013; DG CHEST 2 VIEW dated 03/03/2013  FINDINGS: There is mild bilateral interstitial prominence, likely chronic. There is  no focal parenchymal opacity, pleural effusion, or pneumothorax. The heart and mediastinal contours are unremarkable.  The osseous structures are unremarkable.  IMPRESSION: There is mild chronic bilateral interstitial thickening.  No active cardiopulmonary disease.   Electronically Signed   By: Kathreen Devoid   On: 03/22/2013 20:13   US Renal  03/22/2013   CLINICAL DATA:  Acute kidney injury. Chronic kidney disease. Bilateral hydronephrosis. Bladder tumor.  EXAM: RENAL/URINARY TRACT ULTRASOUND COMPLETE  COMPARISON:  CT ABD/PELV WO CM dated 03/09/2013; DG CHEST 2 VIEW dated 03/22/2013  FINDINGS: Right Kidney:  Length: 11.5 cm. Severe hydronephrosis. Dilation of the renal pelvis extends into the proximal ureter. If probably no interval change compared to prior CT.  Left Kidney:  Length: 9.4 cm. Moderate hydronephrosis extending in the anatomic pelvis. Hydroureter is present.  Bladder:  Echogenic mass is present limb posterior urinary bladder with calcifications. Findings are suggestive of transitional cell carcinoma. This measures  21 mm and correlates well with would mass in the bladder seen on prior CT.  IMPRESSION: 1. Severe right and moderate left hydronephrosis. Bilateral hydroureter. 2. 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 Partially calcified bladder stone considered less likely.   Electronically Signed   By: Dereck Ligas M.D.   On: 03/22/2013 20:34     EKG Interpretation None      4:10 PM Discussed patient with Dr Alinda Money.  Pt saw Dr Jill Side yesterday and when labs came back and was sent to ED creat 3.4, K 5.8 (yesterday).  Dr Alinda Money requested call after labs returned to determine need for urgent intervention vs hospitalist admission with AM urology consult/procedure.  Labs, EKG, UA ordered.   Pt reports she is asymptomatic, no needs at this time.   4:18 PM Discussed pt with Dr Regenia Skeeter  6:15 PM Dr Alinda Money to see patient.  Requests medical  admission.   6:52 PM Spoke with Dr Posey Pronto.   MDM   Final diagnoses:  Acute renal failure  Hydronephrosis  Bladder cancer    Pt sent in by urologist Dr Eulogio Ditch due to abnormal lab values indicating acute renal failure.  Pt states she is still urinating, has no complaints presently.  Labs do indicate renal failure.  Discussed labs and UA with Dr Alinda Money - WBC and RBC in UA likely reflect tumor itself.  Few bacteria present.  Will send for culture and no current antibiotic treatment per my discussion with Dr Alinda Money.  Pt admitted to [facility] with urology consulting.    Clayton Bibles, PA-C 03/22/13 2139

## 2013-03-22 NOTE — ED Notes (Signed)
Pt states back pain radiating around side to the front.  Pt states fell 1-2 nights before back pain, landing on tailbone.  Pt has hx of back surgery.  No change in bladder habits.  No pain with urination.

## 2013-03-22 NOTE — ED Notes (Addendum)
Initial Contact - pt to [facility] 19 with family, reports c/o 7/10 to low back x2 weeks after slip and fall out of bed.  Pt denies other complaints and reports otherwise is at baseline.  MAEI, +csm/+pulses, skin PWD.  Speaking full/clear sentences, rr even/un-lab.  Pt denies CP/SOB.  Pt denies fevers/chills, dysuria.  NAD.

## 2013-03-23 ENCOUNTER — Encounter (HOSPITAL_COMMUNITY): Payer: Self-pay | Admitting: Radiology

## 2013-03-23 DIAGNOSIS — Z86711 Personal history of pulmonary embolism: Secondary | ICD-10-CM

## 2013-03-23 DIAGNOSIS — D494 Neoplasm of unspecified behavior of bladder: Secondary | ICD-10-CM | POA: Diagnosis present

## 2013-03-23 DIAGNOSIS — N1330 Unspecified hydronephrosis: Secondary | ICD-10-CM | POA: Diagnosis present

## 2013-03-23 DIAGNOSIS — E039 Hypothyroidism, unspecified: Secondary | ICD-10-CM

## 2013-03-23 DIAGNOSIS — E119 Type 2 diabetes mellitus without complications: Secondary | ICD-10-CM

## 2013-03-23 DIAGNOSIS — C679 Malignant neoplasm of bladder, unspecified: Principal | ICD-10-CM

## 2013-03-23 DIAGNOSIS — I10 Essential (primary) hypertension: Secondary | ICD-10-CM

## 2013-03-23 DIAGNOSIS — N189 Chronic kidney disease, unspecified: Secondary | ICD-10-CM

## 2013-03-23 DIAGNOSIS — N179 Acute kidney failure, unspecified: Secondary | ICD-10-CM | POA: Diagnosis present

## 2013-03-23 DIAGNOSIS — N139 Obstructive and reflux uropathy, unspecified: Secondary | ICD-10-CM | POA: Diagnosis present

## 2013-03-23 LAB — APTT: APTT: 47 s — AB (ref 24–37)

## 2013-03-23 LAB — GLUCOSE, CAPILLARY
GLUCOSE-CAPILLARY: 81 mg/dL (ref 70–99)
Glucose-Capillary: 142 mg/dL — ABNORMAL HIGH (ref 70–99)
Glucose-Capillary: 96 mg/dL (ref 70–99)
Glucose-Capillary: 136 mg/dL — ABNORMAL HIGH (ref 70–99)

## 2013-03-23 LAB — COMPREHENSIVE METABOLIC PANEL
ALT: 7 U/L (ref 0–35)
AST: 17 U/L (ref 0–37)
Albumin: 2.2 g/dL — ABNORMAL LOW (ref 3.5–5.2)
Alkaline Phosphatase: 185 U/L — ABNORMAL HIGH (ref 39–117)
BUN: 55 mg/dL — ABNORMAL HIGH (ref 6–23)
CALCIUM: 8.6 mg/dL (ref 8.4–10.5)
CO2: 22 meq/L (ref 19–32)
Chloride: 102 mEq/L (ref 96–112)
Creatinine, Ser: 3.79 mg/dL — ABNORMAL HIGH (ref 0.50–1.10)
GFR calc Af Amer: 13 mL/min — ABNORMAL LOW (ref >90)
GFR calc non Af Amer: 11 mL/min — ABNORMAL LOW (ref >90)
Glucose, Bld: 133 mg/dL — ABNORMAL HIGH (ref 70–99)
POTASSIUM: 5.5 meq/L — AB (ref 3.7–5.3)
SODIUM: 136 meq/L — AB (ref 137–147)
Total Bilirubin: <0.2 mg/dL — ABNORMAL LOW (ref 0.3–1.2)
Total Protein: 6.5 g/dL (ref 6.0–8.3)

## 2013-03-23 LAB — CBC
HCT: 25.9 % — ABNORMAL LOW (ref 36.0–46.0)
Hemoglobin: 7.6 g/dL — ABNORMAL LOW (ref 12.0–15.0)
MCH: 25.5 pg — AB (ref 26.0–34.0)
MCHC: 29.3 g/dL — AB (ref 30.0–36.0)
MCV: 86.9 fL (ref 78.0–100.0)
PLATELETS: 419 10*3/uL — AB (ref 150–400)
RBC: 2.98 MIL/uL — ABNORMAL LOW (ref 3.87–5.11)
RDW: 16.6 % — AB (ref 11.5–15.5)
WBC: 9.2 10*3/uL (ref 4.0–10.5)

## 2013-03-23 LAB — PROTIME-INR
INR: 2.43 — ABNORMAL HIGH (ref 0.00–1.49)
PROTHROMBIN TIME: 25.6 s — AB (ref 11.6–15.2)

## 2013-03-23 LAB — TSH: TSH: 34.271 u[IU]/mL — ABNORMAL HIGH (ref 0.350–4.500)

## 2013-03-23 MED ORDER — GLUCERNA SHAKE PO LIQD
237.0000 mL | Freq: Two times a day (BID) | ORAL | Status: DC
  Administered 2013-03-25 – 2013-04-02 (×9): 237 mL via ORAL
  Filled 2013-03-23 (×25): qty 237

## 2013-03-23 MED ORDER — IPRATROPIUM-ALBUTEROL 0.5-2.5 (3) MG/3ML IN SOLN
3.0000 mL | RESPIRATORY_TRACT | Status: DC | PRN
Start: 2013-03-23 — End: 2013-04-03
  Administered 2013-03-27 – 2013-03-30 (×2): 3 mL via RESPIRATORY_TRACT
  Filled 2013-03-23 (×2): qty 3

## 2013-03-23 MED ORDER — INSULIN ASPART 100 UNIT/ML ~~LOC~~ SOLN
0.0000 [IU] | SUBCUTANEOUS | Status: DC
  Administered 2013-03-23: 1 [IU] via SUBCUTANEOUS
  Administered 2013-03-25: 2 [IU] via SUBCUTANEOUS
  Administered 2013-03-25: 1 [IU] via SUBCUTANEOUS
  Administered 2013-03-25 – 2013-03-26 (×3): 2 [IU] via SUBCUTANEOUS
  Administered 2013-03-26 – 2013-03-27 (×2): 1 [IU] via SUBCUTANEOUS

## 2013-03-23 MED ORDER — IPRATROPIUM-ALBUTEROL 0.5-2.5 (3) MG/3ML IN SOLN
3.0000 mL | RESPIRATORY_TRACT | Status: DC
  Administered 2013-03-23: 3 mL via RESPIRATORY_TRACT

## 2013-03-23 MED ORDER — SODIUM CHLORIDE 0.9 % IV SOLN
INTRAVENOUS | Status: DC
  Administered 2013-03-23 – 2013-03-31 (×8): via INTRAVENOUS

## 2013-03-23 MED ORDER — IPRATROPIUM-ALBUTEROL 0.5-2.5 (3) MG/3ML IN SOLN
RESPIRATORY_TRACT | Status: AC
  Filled 2013-03-23: qty 3

## 2013-03-23 NOTE — Progress Notes (Signed)
Patient's hemoglobin dropped from 11.2 to 7.6.  Patient NPO with no IVF ordered.  MD on call paged.  New orders received.  Will continue to monitor.

## 2013-03-23 NOTE — Progress Notes (Signed)
[facility] PROGRESS NOTE  CRISTALLE ROHM WUJ:[id number] DOB: 1940-09-06 DOA: 03/22/2013 PCP: Rogene Houston, MD  Assessment/Plan: 1. 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  - Appreciate Urology recs - Pt receiving FFP to reverse coumadin levels - Plan for IR guided nephrostomy tube placement hopefully soon 2. Diabetes mellitus  Since the patient will be n.p.o. I would reduce her Lantus from 30 units to 15, place him on sliding scale  3. Hypertension, chronic diastolic dysfunction  - Holding Lasix and reducing the Toprol to 50 daily at present, continue clonidine.  4. Active smoking, bilateral wheezing  -cont duonebs PRN 5. Hypothyroidism  Continue Synthroid as tolerated  Code Status: DNR Family Communication: Pt and family in room (indicate person spoken with, relationship, and if by phone, the number) Disposition Plan: Pending   Consultants:  Urology  Procedures:    Antibiotics:    HPI/Subjective: No complaints. No acute events noted overnight.  Objective: Filed Vitals:   03/23/13 0110 03/23/13 0555 03/23/13 1054 03/23/13 1152  BP:  134/56 119/70 120/68  Pulse:  71 67 67  Temp:  98.6 F (37 C) 98 F (36.7 C) 98.1 F (36.7 C)  TempSrc:  Oral    Resp:  18 16   Height:      Weight:      SpO2: 91% 94% 94% 94%    Intake/Output Summary (Last 24 hours) at 03/23/13 1403 Last data filed at 03/23/13 1132  Gross per 24 hour  Intake    307 ml  Output    150 ml  Net    157 ml   Filed Weights   03/22/13 2319  Weight: 66.5 kg (146 lb 9.7 oz)    Exam:   General:  Awake, in nad  Cardiovascular: regular, s1, s2  Respiratory: normal resp effort, no wheezing  Abdomen: soft, nondistended  Musculoskeletal: perfused, no clubbing   Data Reviewed: Basic Metabolic Panel:  Recent Labs Lab 03/22/13 1800 03/22/13 1803  03/23/13 0530  NA 137 136* 136*  K 5.6* 5.0 5.5*  CL 106 98 102  CO2  --  22 22  GLUCOSE 98 101* 133*  BUN 63* 53* 55*  CREATININE 3.70* 3.40* 3.79*  CALCIUM  --  9.6 8.6   Liver Function Tests:  Recent Labs Lab 03/22/13 1803 03/23/13 0530  AST 23 17  ALT 10 7  ALKPHOS 244* 185*  BILITOT <0.2* <0.2*  PROT 8.5* 6.5  ALBUMIN 3.0* 2.2*   No results found for this basename: LIPASE, AMYLASE,  in the last 168 hours No results found for this basename: AMMONIA,  in the last 168 hours CBC:  Recent Labs Lab 03/22/13 1650 03/22/13 1800 03/23/13 0530  WBC 9.9  --  9.2  NEUTROABS 6.5  --   --   HGB 10.0* 11.2* 7.6*  HCT 31.2* 33.0* 25.9*  MCV 85.0  --  86.9  PLT 286  --  419*   Cardiac Enzymes: No results found for this basename: CKTOTAL, CKMB, CKMBINDEX, TROPONINI,  in the last 168 hours BNP (last 3 results)  Recent Labs  03/03/13 2334  PROBNP 731.0*   CBG:  Recent Labs Lab 03/22/13 2351 03/23/13 0409 03/23/13 0919  GLUCAP 117* 142* 81    No results found for this or any previous visit (from the past 240 hour(s)).   Studies: Dg Chest 2 View  03/22/2013   CLINICAL DATA:  Wheezing,  shortness of breath  EXAM: CHEST  2 VIEW  COMPARISON:  CT ABD/PELV WO CM dated 03/09/2013; DG CHEST 2 VIEW dated 03/03/2013  FINDINGS: There is mild bilateral interstitial prominence, likely chronic. There is no focal parenchymal opacity, pleural effusion, or pneumothorax. The heart and mediastinal contours are unremarkable.  The osseous structures are unremarkable.  IMPRESSION: There is mild chronic bilateral interstitial thickening.  No active cardiopulmonary disease.   Electronically Signed   By: Kathreen Devoid   On: 03/22/2013 20:13   US Renal  03/22/2013   CLINICAL DATA:  Acute kidney injury. Chronic kidney disease. Bilateral hydronephrosis. Bladder tumor.  EXAM: RENAL/URINARY TRACT ULTRASOUND COMPLETE  COMPARISON:  CT ABD/PELV WO CM dated 03/09/2013; DG CHEST 2 VIEW dated 03/22/2013   FINDINGS: Right Kidney:  Length: 11.5 cm. Severe hydronephrosis. Dilation of the renal pelvis extends into the proximal ureter. If probably no interval change compared to prior CT.  Left Kidney:  Length: 9.4 cm. Moderate hydronephrosis extending in the anatomic pelvis. Hydroureter is present.  Bladder:  Echogenic mass is present limb posterior urinary bladder with calcifications. Findings are suggestive of transitional cell carcinoma. This measures 21 mm and correlates well with would mass in the bladder seen on prior CT.  IMPRESSION: 1. Severe right and moderate left hydronephrosis. Bilateral hydroureter. 2. 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 Partially calcified bladder stone considered less likely.   Electronically Signed   By: Dereck Ligas M.D.   On: 03/22/2013 20:34    Scheduled Meds: . cloNIDine  0.1 mg Oral BID  . ezetimibe  10 mg Oral Daily  . feeding supplement (GLUCERNA SHAKE)  237 mL Oral BID BM  . ferrous sulfate  325 mg Oral Q breakfast  . insulin aspart  0-9 Units Subcutaneous Q4H  . insulin glargine  15 Units Subcutaneous QHS  . levothyroxine  112 mcg Oral QAC breakfast  . metoprolol succinate  50 mg Oral Daily   Continuous Infusions: . sodium chloride 75 mL/hr at 03/23/13 0702    Principal Problem:   Acute-on-chronic kidney injury Active Problems:   History of pulmonary embolus (PE)   Diabetes mellitus   Hypertension   Open wound of abdominal wall without complication   Smoking   Obstructive uropathy   Bladder tumor   Bilateral hydronephrosis  Time spent: 1min  Tekeyah Santiago, [facility] Pager [phone]. If 7PM-7AM, please contact night-coverage at [email or web address], password [facility] 03/23/2013, 2:03 PM  LOS: 1 day

## 2013-03-23 NOTE — Progress Notes (Signed)
INITIAL NUTRITION ASSESSMENT  DOCUMENTATION CODES Per approved criteria  -Not applicable    INTERVENTION:  Glucerna BID, each supplement provides 220 kcal and 10 grams protein  NUTRITION DIAGNOSIS: Inadequate oral intake related to decreased appetite as evidenced by patient report of PO intake.   Goal: Patient to meet >/= 90% of estimated nutrition needs  Monitor:  PO intake and supplement acceptance, I/Os, weight trends, labs  Reason for Assessment: Malnutrition Screening Tool Risk  73 y.o. 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 The patient is coming from home. Patient complains of some bilateral back pain with generalized weakness and tiredness that is present since last 3 weeks. She also has noted diarrhea since last one week with 3-4 bowel movements a day without any blood. Patient's diarrhea started after 1 week course of antibiotic 2 weeks ago.   Patient reports that she has lost 5-6 pounds over the past couple of weeks, which is consistent with documented weight history. Patient has had a 3% weight loss since 2/16. Patient's son stated that the patient has been eating very small portions; for instance, at breakfast she might eat 1/2 piece of toast and 1/2 egg. Patient stated that she hasn't had an appetite for th past couple of weeks and she doesn't feel like eating. Patient reported drinking Glucerna Shakes at home. Dietetic intern encouraged patient to try to eat meals as able and to continue to drink Glucerna especially when intake is minimal.    Sodium low at 136. Potassium elevated at 5.6. Creatinine elevated at 3.79. CBGs:  117, 142,  81  Nutrition Focused Physical Exam:  Subcutaneous Fat:  Orbital Region: WNL Upper Arm Region: WNL Thoracic and Lumbar Region: WNL  Muscle:  Temple Region: mild depletion Clavicle Bone Region: mild depletion Clavicle and Acromion Bone Region: mild depletion Scapular Bone Region: WNL Dorsal Hand: WNL Patellar Region: WNL Anterior Thigh Region: WNL Posterior Calf Region: WNL  Edema: +1 RLE and LLE  Height: Ht Readings from Last 1 Encounters:  03/22/13 5\' 1"  (1.549 m)    Weight: Wt Readings from Last 1 Encounters:  03/22/13 146 lb 9.7 oz (66.5 kg)    Ideal Body Weight: 105 lb (47.7 kg)  % Ideal Body Weight: 139%  Wt Readings from Last 10 Encounters:  03/22/13 146 lb 9.7 oz (66.5 kg)  03/06/13 151 lb (68.493 kg)  03/03/13 150 lb (68.04 kg)  07/27/12 153 lb (69.4 kg)  03/04/12 162 lb 6.4 oz (73.664 kg)  08/04/11 157 lb (71.215 kg)  07/28/11 155 lb 12.8 oz (70.67 kg)  05/18/11 155 lb 6.4 oz (70.489 kg)  04/23/11 155 lb 9.6 oz (70.58 kg)  02/03/11 153 lb 14.4 oz (69.809 kg)    Usual Body Weight: 150 lb (68.0 kg)  % Usual Body Weight: 97%  BMI:  Body mass index is 27.72 kg/(m^2).  Estimated Nutritional Needs: Kcal: 1400-1600  Protein: 65-75 grams Fluid: 1.2 L fluid restriction   Skin: no wounds  Diet Order: NPO  EDUCATION NEEDS: -No education needs identified at this time   Intake/Output Summary (Last 24 hours) at 03/23/13 0951 Last data filed at 03/23/13 0118  Gross per 24 hour  Intake      0 ml  Output    150  ml  Net   -150 ml    Last BM: 3/3   Labs:   Recent Labs Lab 03/22/13 1800 03/22/13 1803 03/23/13 0530  NA 137 136* 136*  K 5.6* 5.0 5.5*  CL 106 98 102  CO2  --  22 22  BUN 63* 53* 55*  CREATININE 3.70* 3.40* 3.79*  CALCIUM  --  9.6 8.6  GLUCOSE 98 101* 133*    CBG (last 3)   Recent Labs  03/22/13 2351 03/23/13 0409 03/23/13 0919  GLUCAP 117* 142* 81    Scheduled Meds: . cloNIDine  0.1 mg Oral BID  . ezetimibe   10 mg Oral Daily  . ferrous sulfate  325 mg Oral Q breakfast  . insulin aspart  0-9 Units Subcutaneous Q4H  . insulin glargine  15 Units Subcutaneous QHS  . levothyroxine  112 mcg Oral QAC breakfast  . metoprolol succinate  50 mg Oral Daily    Continuous Infusions: . sodium chloride 75 mL/hr at 03/23/13 4235    Past Medical History  Diagnosis Date  . Diabetes mellitus   . Hypertension   . Thyroid disease   . Colon cancer   . COPD (chronic obstructive pulmonary disease)   . MRSA infection   . Pulmonary embolus   . Deep vein thrombosis   . Pneumonia   . Rheumatic fever   . Dehydration   . MRSA (methicillin resistant staph aureus) culture positive   . Arthritis   . History of shingles 02/2010  . Colon cancer     Past Surgical History  Procedure Laterality Date  . Back surgery    . Partial hysterectomy    . Cholecystectomy    . Colostomy takedown    . Arthroscopic repair acl    . Hernia repair    . Breast cyst excision    . Gallbladder surgery    . Back surgery    . Bile duct exploration    . Bladder tact      '70s  . Cystitis    . Port-a-cath removal    . Abcess drainage      abdominal wall    Claudell Kyle, Dietetic Intern Pager: [phone]

## 2013-03-23 NOTE — Consult Note (Signed)
HPI: Heather [name] is an 73 y.o. female admitted with acute on chronic renal injury. 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 She has been evaluated by Urology, who feels initial treatment would be best to place bilateral percutaneous nephrostomy tubes to decompress her and allow her renal function to improve. IR is requested to place these (B)PCN Imaging reviewed, significant (B)hydro evident. 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 She is receiving 2 units of FFP today to reverse her INR in hopes the procedure can be performed tomorrow. She already has an IVC filter which is confirmed on CT. Chart, PMHx, meds reviewed.  Past Medical History:  Past Medical History  Diagnosis Date  . Diabetes mellitus   . Hypertension   . Thyroid disease   . Colon cancer   . COPD (chronic obstructive pulmonary disease)   . MRSA infection   . Pulmonary embolus   . Deep vein thrombosis   . Pneumonia   . Rheumatic fever   . Dehydration   . MRSA (methicillin resistant staph aureus) culture positive   . Arthritis   . History of shingles 02/2010  . Colon cancer     Past Surgical History:  Past Surgical History  Procedure Laterality Date  . Back surgery    . Partial hysterectomy    . Cholecystectomy    . Colostomy takedown    . Arthroscopic repair acl    . Hernia repair    . Breast cyst excision    . Gallbladder surgery    . Back surgery    . Bile duct exploration    . Bladder tact      '70s  . Cystitis    . Port-a-cath removal    . Abcess drainage      abdominal wall    Family History:  Family History  Problem Relation Age of Onset  . Heart disease Sister   . Heart disease Brother   . Healthy Daughter   . Healthy Son   . Heart disease Mother   . Heart disease Father     Social History:  reports that she has been smoking Cigarettes.  She has a 35.25 pack-year smoking history.  She has never used smokeless tobacco. She reports that she does not drink alcohol or use illicit drugs.  Allergies:  Allergies  Allergen Reactions  . Motrin [Ibuprofen] Rash  . Sulfa Drugs Cross Reactors Rash  . Iohexol      Code: HIVES, Desc: pt states she has dye allergy, Onset Date: [id number]   . Quinine Derivatives Rash    Medications:   Medication List    ASK your doctor about these medications       acetaminophen 500 MG tablet  Commonly known as:  TYLENOL  Take 1,000 mg by mouth every 8 (eight) hours as needed.     Calcium Carbonate-Vit D-Min 600-200 MG-UNIT Tabs  Take 1 tablet by mouth daily.     ciprofloxacin 500 MG tablet  Commonly known as:  CIPRO  Take 1 tablet (500 mg total) by mouth 2 (two) times daily.     cloNIDine 0.1 MG tablet  Commonly known as:  CATAPRES  Take 0.1 mg by mouth 2 (two) times daily.     D3 ADULT PO  Take 1,000 Units by mouth daily.     ezetimibe 10 MG tablet  Commonly known as:  ZETIA  Take 10 mg  by mouth daily.     ferrous sulfate 325 (65 FE) MG tablet  Take 325 mg by mouth daily with breakfast.     furosemide 40 MG tablet  Commonly known as:  LASIX  Take 40 mg by mouth every other day.     insulin glargine 100 UNIT/ML injection  Commonly known as:  LANTUS  Inject 30 Units into the skin at bedtime.     insulin lispro 100 UNIT/ML injection  Commonly known as:  HUMALOG  Inject according to sliding scale up to three times per day.     levothyroxine 112 MCG tablet  Commonly known as:  LEVOTHROID  Take 1 tablet (112 mcg total) by mouth daily before breakfast.     metoprolol succinate 50 MG 24 hr tablet  Commonly known as:  TOPROL-XL  Take 50 mg by mouth 2 (two) times daily. Take with or immediately following a meal.     ondansetron 4 MG disintegrating tablet  Commonly known as:  ZOFRAN ODT  Take 1 tablet (4 mg total) by mouth every 8 (eight) hours as needed for nausea or vomiting.     potassium chloride 20 MEQ packet   Commonly known as:  KLOR-CON  Take 20 mEq by mouth daily as needed (take only if furosemide has been taken).     warfarin 3 MG tablet  Commonly known as:  COUMADIN  Take 3 mg by mouth daily.     WOMENS MULTIVITAMIN PLUS PO  Take 1 capsule by mouth.        Please HPI for pertinent positives, otherwise complete 10 system ROS negative.  Physical Exam: BP 120/68  Pulse 67  Temp(Src) 98.1 F (36.7 C) (Oral)  Resp 16  Ht $R'5\' 1"'Dd$  (1.549 m)  Wt 146 lb 9.7 oz (66.5 kg)  BMI 27.72 kg/m2  SpO2 94% Body mass index is 27.72 kg/(m^2).   General Appearance:  Alert, cooperative, no distress, appears stated age  Head:  Normocephalic, without obvious abnormality, atraumatic  ENT: Unremarkable airway  Neck: Supple, symmetrical, trachea midline  Lungs:   Clear to auscultation bilaterally, no w/r/r,  Chest Wall:  No tenderness or deformity  Heart:  Regular rate and rhythm, S1, S2 normal, no murmur, rub or gallop.  Abdomen:   Soft, non-tender, non distended.  Extremities: Extremities normal, atraumatic, no cyanosis or edema  Pulses: 2+ and symmetric  Neurologic: Normal affect, no gross deficits.   Results for orders placed during the hospital encounter of 03/22/13 (from the past 48 hour(s))  CBC WITH DIFFERENTIAL     Status: Abnormal   Collection Time    03/22/13  4:50 PM      Result Value Ref Range   WBC 9.9  4.0 - 10.5 K/uL   RBC 3.67 (*) 3.87 - 5.11 MIL/uL   Hemoglobin 10.0 (*) 12.0 - 15.0 g/dL   HCT 31.2 (*) 36.0 - 46.0 %   MCV 85.0  78.0 - 100.0 fL   MCH 27.2  26.0 - 34.0 pg   MCHC 32.1  30.0 - 36.0 g/dL   RDW 17.0 (*) 11.5 - 15.5 %   Platelets 286  150 - 400 K/uL   Neutrophils Relative % 66  43 - 77 %   Neutro Abs 6.5  1.7 - 7.7 K/uL   Lymphocytes Relative 26  12 - 46 %   Lymphs Abs 2.6  0.7 - 4.0 K/uL   Monocytes Relative 5  3 - 12 %   Monocytes Absolute 0.5  0.1 -  1.0 K/uL   Eosinophils Relative 3  0 - 5 %   Eosinophils Absolute 0.3  0.0 - 0.7 K/uL   Basophils Relative  1  0 - 1 %   Basophils Absolute 0.1  0.0 - 0.1 K/uL  PROTIME-INR     Status: Abnormal   Collection Time    03/22/13  4:50 PM      Result Value Ref Range   Prothrombin Time 23.7 (*) 11.6 - 15.2 seconds   INR 2.20 (*) 0.00 - 1.49  URINALYSIS, ROUTINE W REFLEX MICROSCOPIC     Status: Abnormal   Collection Time    03/22/13  5:02 PM      Result Value Ref Range   Color, Urine YELLOW  YELLOW   APPearance CLOUDY (*) CLEAR   Specific Gravity, Urine 1.015  1.005 - 1.030   pH 6.0  5.0 - 8.0   Glucose, UA NEGATIVE  NEGATIVE mg/dL   Hgb urine dipstick LARGE (*) NEGATIVE   Bilirubin Urine NEGATIVE  NEGATIVE   Ketones, ur NEGATIVE  NEGATIVE mg/dL   Protein, ur 30 (*) NEGATIVE mg/dL   Urobilinogen, UA 0.2  0.0 - 1.0 mg/dL   Nitrite POSITIVE (*) NEGATIVE   Leukocytes, UA LARGE (*) NEGATIVE  URINE MICROSCOPIC-ADD ON     Status: Abnormal   Collection Time    03/22/13  5:02 PM      Result Value Ref Range   Squamous Epithelial / LPF FEW (*) RARE   WBC, UA TOO NUMEROUS TO COUNT  <3 WBC/hpf   RBC / HPF TOO NUMEROUS TO COUNT  <3 RBC/hpf   Bacteria, UA FEW (*) RARE  I-STAT CHEM 8, ED     Status: Abnormal   Collection Time    03/22/13  6:00 PM      Result Value Ref Range   Sodium 137  137 - 147 mEq/L   Potassium 5.6 (*) 3.7 - 5.3 mEq/L   Chloride 106  96 - 112 mEq/L   BUN 63 (*) 6 - 23 mg/dL   Creatinine, Ser 3.70 (*) 0.50 - 1.10 mg/dL   Glucose, Bld 98  70 - 99 mg/dL   Calcium, Ion 1.05 (*) 1.13 - 1.30 mmol/L   TCO2 25  0 - 100 mmol/L   Hemoglobin 11.2 (*) 12.0 - 15.0 g/dL   HCT 33.0 (*) 36.0 - 46.0 %  HEPATIC FUNCTION PANEL     Status: Abnormal   Collection Time    03/22/13  6:03 PM      Result Value Ref Range   Total Protein 8.5 (*) 6.0 - 8.3 g/dL   Albumin 3.0 (*) 3.5 - 5.2 g/dL   AST 23  0 - 37 U/L   ALT 10  0 - 35 U/L   Alkaline Phosphatase 244 (*) 39 - 117 U/L   Total Bilirubin <0.2 (*) 0.3 - 1.2 mg/dL   Bilirubin, Direct <0.2  0.0 - 0.3 mg/dL   Indirect Bilirubin NOT CALCULATED   0.3 - 0.9 mg/dL  BASIC METABOLIC PANEL     Status: Abnormal   Collection Time    03/22/13  6:03 PM      Result Value Ref Range   Sodium 136 (*) 137 - 147 mEq/L   Potassium 5.0  3.7 - 5.3 mEq/L   Chloride 98  96 - 112 mEq/L   Comment: REPEATED TO VERIFY     DELTA CHECK NOTED   CO2 22  19 - 32 mEq/L   Glucose, Bld 101 (*)  70 - 99 mg/dL   BUN 53 (*) 6 - 23 mg/dL   Creatinine, Ser 3.40 (*) 0.50 - 1.10 mg/dL   Calcium 9.6  8.4 - 10.5 mg/dL   GFR calc non Af Amer 12 (*) >90 mL/min   GFR calc Af Amer 14 (*) >90 mL/min   Comment: (NOTE)     The eGFR has been calculated using the CKD EPI equation.     This calculation has not been validated in all clinical situations.     eGFR's persistently <90 mL/min signify possible Chronic Kidney     Disease.  GLUCOSE, CAPILLARY     Status: Abnormal   Collection Time    03/22/13 11:51 PM      Result Value Ref Range   Glucose-Capillary 117 (*) 70 - 99 mg/dL   Comment 1 Notify RN    GLUCOSE, CAPILLARY     Status: Abnormal   Collection Time    03/23/13  4:09 AM      Result Value Ref Range   Glucose-Capillary 142 (*) 70 - 99 mg/dL   Comment 1 Notify RN    PROTIME-INR     Status: Abnormal   Collection Time    03/23/13  5:30 AM      Result Value Ref Range   Prothrombin Time 25.6 (*) 11.6 - 15.2 seconds   INR 2.43 (*) 0.00 - 1.49  CBC     Status: Abnormal   Collection Time    03/23/13  5:30 AM      Result Value Ref Range   WBC 9.2  4.0 - 10.5 K/uL   RBC 2.98 (*) 3.87 - 5.11 MIL/uL   Hemoglobin 7.6 (*) 12.0 - 15.0 g/dL   Comment: DELTA CHECK NOTED     REPEATED TO VERIFY     PREVIOUS RESULT ISTAT   HCT 25.9 (*) 36.0 - 46.0 %   MCV 86.9  78.0 - 100.0 fL   MCH 25.5 (*) 26.0 - 34.0 pg   MCHC 29.3 (*) 30.0 - 36.0 g/dL   RDW 16.6 (*) 11.5 - 15.5 %   Platelets 419 (*) 150 - 400 K/uL   Comment: DELTA CHECK NOTED     REPEATED TO VERIFY  APTT     Status: Abnormal   Collection Time    03/23/13  5:30 AM      Result Value Ref Range   aPTT 47 (*)  24 - 37 seconds   Comment:            IF BASELINE aPTT IS ELEVATED,     SUGGEST PATIENT RISK ASSESSMENT     BE USED TO DETERMINE APPROPRIATE     ANTICOAGULANT THERAPY.  COMPREHENSIVE METABOLIC PANEL     Status: Abnormal   Collection Time    03/23/13  5:30 AM      Result Value Ref Range   Sodium 136 (*) 137 - 147 mEq/L   Potassium 5.5 (*) 3.7 - 5.3 mEq/L   Chloride 102  96 - 112 mEq/L   CO2 22  19 - 32 mEq/L   Glucose, Bld 133 (*) 70 - 99 mg/dL   BUN 55 (*) 6 - 23 mg/dL   Creatinine, Ser 3.79 (*) 0.50 - 1.10 mg/dL   Calcium 8.6  8.4 - 10.5 mg/dL   Total Protein 6.5  6.0 - 8.3 g/dL   Albumin 2.2 (*) 3.5 - 5.2 g/dL   AST 17  0 - 37 U/L   ALT 7  0 -  35 U/L   Alkaline Phosphatase 185 (*) 39 - 117 U/L   Total Bilirubin <0.2 (*) 0.3 - 1.2 mg/dL   GFR calc non Af Amer 11 (*) >90 mL/min   GFR calc Af Amer 13 (*) >90 mL/min   Comment: (NOTE)     The eGFR has been calculated using the CKD EPI equation.     This calculation has not been validated in all clinical situations.     eGFR's persistently <90 mL/min signify possible Chronic Kidney     Disease.  TSH     Status: Abnormal   Collection Time    03/23/13  5:30 AM      Result Value Ref Range   TSH 34.271 (*) 0.350 - 4.500 uIU/mL   Comment: Performed at [place]     Status: None   Collection Time    03/23/13  8:15 AM      Result Value Ref Range   ABO/RH(D) O POS     Antibody Screen NEG     Sample Expiration 03/26/2013     DAT, IgG NOT NEEDED    GLUCOSE, CAPILLARY     Status: None   Collection Time    03/23/13  9:19 AM      Result Value Ref Range   Glucose-Capillary 81  70 - 99 mg/dL   Comment 1 Documented in Chart     Comment 2 Notify RN    PREPARE FRESH FROZEN PLASMA     Status: None   Collection Time    03/23/13  9:52 AM      Result Value Ref Range   Unit Number [id number]     Blood Component Type THAWED PLASMA     Unit division 00     Status of Unit ISSUED     Transfusion Status OK  TO TRANSFUSE     Unit Number [id number]     Blood Component Type THAWED PLASMA     Unit division 00     Status of Unit ALLOCATED     Transfusion Status OK TO TRANSFUSE     Dg Chest 2 View  03/22/2013   CLINICAL DATA:  Wheezing, shortness of breath  EXAM: CHEST  2 VIEW  COMPARISON:  CT ABD/PELV WO CM dated 03/09/2013; DG CHEST 2 VIEW dated 03/03/2013  FINDINGS: There is mild bilateral interstitial prominence, likely chronic. There is no focal parenchymal opacity, pleural effusion, or pneumothorax. The heart and mediastinal contours are unremarkable.  The osseous structures are unremarkable.  IMPRESSION: There is mild chronic bilateral interstitial thickening.  No active cardiopulmonary disease.   Electronically Signed   By: Kathreen Devoid   On: 03/22/2013 20:13   US Renal  03/22/2013   CLINICAL DATA:  Acute kidney injury. Chronic kidney disease. Bilateral hydronephrosis. Bladder tumor.  EXAM: RENAL/URINARY TRACT ULTRASOUND COMPLETE  COMPARISON:  CT ABD/PELV WO CM dated 03/09/2013; DG CHEST 2 VIEW dated 03/22/2013  FINDINGS: Right Kidney:  Length: 11.5 cm. Severe hydronephrosis. Dilation of the renal pelvis extends into the proximal ureter. If probably no interval change compared to prior CT.  Left Kidney:  Length: 9.4 cm. Moderate hydronephrosis extending in the anatomic pelvis. Hydroureter is present.  Bladder:  Echogenic mass is present limb posterior urinary bladder with calcifications. Findings are suggestive of transitional cell carcinoma. This measures 21 mm and correlates well with would mass in the bladder seen on prior CT.  IMPRESSION: 1. Severe right and moderate left hydronephrosis. Bilateral hydroureter. 2.  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 Partially calcified bladder stone considered less likely.   Electronically Signed   By: Dereck Ligas M.D.   On: 03/22/2013 20:34    Assessment/Plan Obstructive uropathy with bilateral  hydronephrosis from bladder and retroperitoneal tumor/adenopathy Cr 3.79, K 5.5 INR 2.4 Check labs in am. Discussed plan for (B) PCN placement tomorrow if INR is down. Explained procedure to pt and son in detail, risks, complications, and use of sedation. Consent signed in chart  Ascencion Dike PA-C 03/23/2013, 2:02 PM

## 2013-03-23 NOTE — Progress Notes (Signed)
Subjective: Patient reports that she is feeling better, but still has some abdominal discomfort. She has no nausea or vomiting. She still having some blood in her urine.  Objective: Vital signs in last 24 hours: Temp:  [97.3 F (36.3 C)-98.6 F (37 C)] 98.6 F (37 C) (03/05 0555) Pulse Rate:  [54-93] 71 (03/05 0555) Resp:  [16-20] 18 (03/05 0555) BP: (122-158)/(56-71) 134/56 mmHg (03/05 0555) SpO2:  [80 %-97 %] 94 % (03/05 0555) Weight:  [66.5 kg (146 lb 9.7 oz)] 66.5 kg (146 lb 9.7 oz) (03/04 2319)  Intake/Output from previous day: 03/04 0701 - 03/05 0700 In: -  Out: 150 [Urine:150] Intake/Output this shift:    Physical Exam:  Constitutional: Vital signs reviewed. WD WN in NAD   Eyes: PERRL, No scleral icterus.   Pulmonary/Chest: Normal effort   Lab Results:  Recent Labs  03/22/13 1650 03/22/13 1800 03/23/13 0530  HGB 10.0* 11.2* 7.6*  HCT 31.2* 33.0* 25.9*   BMET  Recent Labs  03/22/13 1803 03/23/13 0530  NA 136* 136*  K 5.0 5.5*  CL 98 102  CO2 22 22  GLUCOSE 101* 133*  BUN 53* 55*  CREATININE 3.40* 3.79*  CALCIUM 9.6 8.6    Recent Labs  03/22/13 1650 03/23/13 0530  INR 2.20* 2.43*   No results found for this basename: LABURIN,  in the last 72 hours Results for orders placed during the hospital encounter of 09/16/09  MRSA PCR SCREENING     Status: Abnormal   Collection Time    09/16/09  5:21 PM      Result Value Ref Range Status   MRSA by PCR   (*) NEGATIVE Final   Value: POSITIVE            The GeneXpert MRSA Assay (FDA     approved for NASAL specimens     only), is one component of a     comprehensive MRSA colonization     surveillance program. It is not     intended to diagnose MRSA     infection nor to guide or     monitor treatment for     MRSA infections. RESULT CALLED TO, READ BACK BY AND VERIFIED WITH: LAUREN RN 18:50 09/16/09 ([email or web address])  CULTURE, ROUTINE-ABSCESS     Status: None   Collection Time    09/17/09  3:30 PM       Result Value Ref Range Status   Specimen Description ABSCESS ABDOMEN   Final   Special Requests PT ON VANC,ZOSYN   Final   Gram Stain     Final   Value: NO WBC SEEN     NO SQUAMOUS EPITHELIAL CELLS SEEN     NO ORGANISMS SEEN   Culture     Final   Value: MODERATE METHICILLIN RESISTANT STAPHYLOCOCCUS AUREUS     Note: RIFAMPIN AND GENTAMICIN SHOULD NOT BE USED AS SINGLE DRUGS FOR TREATMENT OF STAPH INFECTIONS. This organism DOES NOT demonstrate inducible Clindamycin resistance in vitro.   Report Status 09/21/2009 FINAL   Final   Organism ID, Bacteria METHICILLIN RESISTANT STAPHYLOCOCCUS AUREUS   Final  ANAEROBIC CULTURE     Status: None   Collection Time    09/17/09  3:30 PM      Result Value Ref Range Status   Specimen Description ABSCESS ABDOMEN   Final   Special Requests PT ON VANC,ZOSYN   Final   Gram Stain     Final   Value: NO WBC SEEN  NO SQUAMOUS EPITHELIAL CELLS SEEN     NO ORGANISMS SEEN   Culture NO ANAEROBES ISOLATED   Final   Report Status 09/22/2009 FINAL   Final    Studies/Results: Dg Chest 2 View  03/22/2013   CLINICAL DATA:  Wheezing, shortness of breath  EXAM: CHEST  2 VIEW  COMPARISON:  CT ABD/PELV WO CM dated 03/09/2013; DG CHEST 2 VIEW dated 03/03/2013  FINDINGS: There is mild bilateral interstitial prominence, likely chronic. There is no focal parenchymal opacity, pleural effusion, or pneumothorax. The heart and mediastinal contours are unremarkable.  The osseous structures are unremarkable.  IMPRESSION: There is mild chronic bilateral interstitial thickening.  No active cardiopulmonary disease.   Electronically Signed   By: Kathreen Devoid   On: 03/22/2013 20:13   US Renal  03/22/2013   CLINICAL DATA:  Acute kidney injury. Chronic kidney disease. Bilateral hydronephrosis. Bladder tumor.  EXAM: RENAL/URINARY TRACT ULTRASOUND COMPLETE  COMPARISON:  CT ABD/PELV WO CM dated 03/09/2013; DG CHEST 2 VIEW dated 03/22/2013  FINDINGS: Right Kidney:  Length: 11.5 cm. Severe  hydronephrosis. Dilation of the renal pelvis extends into the proximal ureter. If probably no interval change compared to prior CT.  Left Kidney:  Length: 9.4 cm. Moderate hydronephrosis extending in the anatomic pelvis. Hydroureter is present.  Bladder:  Echogenic mass is present limb posterior urinary bladder with calcifications. Findings are suggestive of transitional cell carcinoma. This measures 21 mm and correlates well with would mass in the bladder seen on prior CT.  IMPRESSION: 1. Severe right and moderate left hydronephrosis. Bilateral hydroureter. 2. 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 Partially calcified bladder stone considered less likely.   Electronically Signed   By: Dereck Ligas M.D.   On: 03/22/2013 20:34    Assessment/Plan:   Bilateral hydronephrosis, on the left probably secondary to bladder tumor, on right secondary to retroperitoneal mass around upper ureter. She has subsequent renal insufficiency, with a stable creatinine over the past 2 days of approximately 3.8. Potassium is 5.5.     Renal failure, acute on chronic, creatinine 3.79 today.    Recommendations;    1. I think it worthwhile to give the patient fresh frozen plasma to correct her INR, and have radiology place bilateral percutaneous nephrostomy tubes tomorrow. We can perform cystoscopy, TURBT and possible right ureteroscopy next week.    2. She does not need to be n.p.o. at the present time    3. I spoke with the patient and her family regarding this. We talked about risks of administration of fresh frozen plasma. I think risks of worsening/continued renal insufficiency outweigh the risks of this transfusion.   LOS: 1 day   Jorja Loa 03/23/2013, 9:41 AM

## 2013-03-23 NOTE — Progress Notes (Signed)
I agree with dietetic intern's documentation.  Mikey College MS, [place], [place] Pager [phone] After Hours Pager

## 2013-03-23 NOTE — H&P (Signed)
[facility] History and Physical  Patient: Heather [name]  VWU:[id number]  DOB: [birth year]  DOS: the patient was seen and examined on 03/22/2013 PCP: Rogene Houston, MD  Chief Complaint: Abnormal lab  HPI: Heather [name] is a 73 y.o. 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 The patient is coming from home The patient was brought in by her family member as they were requested by the urologist. Patient complains of some bilateral back pain with generalized weakness and tiredness that is present since last 3 weeks. She also has noted diarrhea since last one week with 3-4 bowel movements a day without any blood. She denies any complaint of fever, chills, chest pain, shortness of breath, cough, active bleeding, burning urination, nausea, vomiting, abdominal pain, focal neurological deficit. She complain of some burning urination in February and was found to have acute kidney injury and had undergone a CT scan of the abdomen which showed presence of bladder tumor with bilateral hydronephrosis and she was scheduled to see urologist this week. In the meantime she was treated her for a one-week course of ciprofloxacin for UTI. 2 weeks ago she was also treated with almost one-week course of antibiotic for oral dental infection. She started diarrhea after that course.  Review of Systems: as mentioned in the history of present illness.  A Comprehensive review of the other systems is negative.  Past Medical History  Diagnosis Date  . Diabetes mellitus   . Hypertension   . Thyroid disease   . Colon cancer   . COPD (chronic obstructive pulmonary disease)   . MRSA infection   . Pulmonary embolus   . Deep vein thrombosis   .  Pneumonia   . Rheumatic fever   . Dehydration   . MRSA (methicillin resistant staph aureus) culture positive   . Arthritis   . History of shingles 02/2010  . Colon cancer    Past Surgical History  Procedure Laterality Date  . Back surgery    . Partial hysterectomy    . Cholecystectomy    . Colostomy takedown    . Arthroscopic repair acl    . Hernia repair    . Breast cyst excision    . Gallbladder surgery    . Back surgery    . Bile duct exploration    . Bladder tact      '70s  . Cystitis    . Port-a-cath removal    . Abcess drainage      abdominal wall   Social History:  reports that she has been smoking Cigarettes.  She has a 35.25 pack-year smoking history. She has never used smokeless tobacco. She reports that she does not drink alcohol or use illicit drugs. Independent for most of her  ADL.  Allergies  Allergen Reactions  . Motrin [Ibuprofen] Rash  . Sulfa Drugs Cross Reactors Rash  . Iohexol      Code: HIVES, Desc: pt states she has dye allergy, Onset Date: 29562130   . Quinine Derivatives Rash    Family History  Problem Relation Age of Onset  . Heart disease Sister   . Heart disease Brother   . Healthy Daughter   . Healthy Son   . Heart disease Mother   . Heart disease Father     Prior to  Admission medications   Medication Sig Start Date End Date Taking? Authorizing Provider  acetaminophen (TYLENOL) 500 MG tablet Take 1,000 mg by mouth every 8 (eight) hours as needed.    Yes Historical Provider, MD  Calcium Carbonate-Vit D-Min 600-200 MG-UNIT TABS Take 1 tablet by mouth daily.   Yes Historical Provider, MD  Cholecalciferol (D3 ADULT PO) Take 1,000 Units by mouth daily.    Yes Historical Provider, MD  cloNIDine (CATAPRES) 0.1 MG tablet Take 0.1 mg by mouth 2 (two) times daily.   Yes Historical Provider, MD  ezetimibe (ZETIA) 10 MG tablet Take 10 mg by mouth daily.   Yes Historical Provider, MD  ferrous sulfate 325 (65 FE) MG tablet Take 325 mg by mouth  daily with breakfast.   Yes Historical Provider, MD  furosemide (LASIX) 40 MG tablet Take 40 mg by mouth every other day.    Yes Historical Provider, MD  insulin glargine (LANTUS) 100 UNIT/ML injection Inject 30 Units into the skin at bedtime.   Yes Historical Provider, MD  insulin lispro (HUMALOG) 100 UNIT/ML injection Inject according to sliding scale up to three times per day.   Yes Historical Provider, MD  levothyroxine (LEVOTHROID) 112 MCG tablet Take 1 tablet (112 mcg total) by mouth daily before breakfast. 04/27/12  Yes Len Blalock, NP  metoprolol succinate (TOPROL-XL) 50 MG 24 hr tablet Take 50 mg by mouth 2 (two) times daily. Take with or immediately following a meal.   Yes Historical Provider, MD  Multiple Vitamins-Minerals (WOMENS MULTIVITAMIN PLUS PO) Take 1 capsule by mouth.   Yes Historical Provider, MD  ondansetron (ZOFRAN ODT) 4 MG disintegrating tablet Take 1 tablet (4 mg total) by mouth every 8 (eight) hours as needed for nausea or vomiting. 03/04/13  Yes Gerhard Munch, MD  potassium chloride (KLOR-CON) 20 MEQ packet Take 20 mEq by mouth daily as needed (take only if furosemide has been taken).    Yes Historical Provider, MD  ciprofloxacin (CIPRO) 500 MG tablet Take 1 tablet (500 mg total) by mouth 2 (two) times daily. 03/08/13   Malissa Hippo, MD  warfarin (COUMADIN) 3 MG tablet Take 1 tablet (3 mg total) by mouth daily. Take 1 tablet daily except take 1-1/2 tablets every Monday and Thursday 03/06/13   Malissa Hippo, MD    Physical Exam: Filed Vitals:   03/22/13 2130 03/22/13 2319 03/23/13 0000 03/23/13 0110  BP: 154/62  152/71   Pulse:   54   Temp:   97.9 F (36.6 C)   TempSrc:   Oral   Resp: 20  18   Height:  5\' 1"  (1.549 m)    Weight:  66.5 kg (146 lb 9.7 oz)    SpO2:   97% 91%    General: Alert, Awake and Oriented to Time, Place and Person. Appear in mild distress Eyes: PERRL ENT: Oral Mucosa clear moist. Neck: no JVD Cardiovascular: S1 and S2 Present, no  Murmur, Peripheral Pulses Present Respiratory: Bilateral Air entry equal and Decreased, no Crackles, bilateral expiratory wheezes Abdomen: Bowel Sound Present, Soft and Non tender Skin: no Rash Extremities: Bilateral Pedal edema, no calf tenderness Neurologic: Grossly Unremarkable. Labs on Admission:  CBC:  Recent Labs Lab 03/22/13 1650 03/22/13 1800  WBC 9.9  --   NEUTROABS 6.5  --   HGB 10.0* 11.2*  HCT 31.2* 33.0*  MCV 85.0  --   PLT 286  --     CMP     Component Value Date/Time  NA 136* 03/22/2013 1803   K 5.0 03/22/2013 1803   CL 98 03/22/2013 1803   CO2 22 03/22/2013 1803   GLUCOSE 101* 03/22/2013 1803   BUN 53* 03/22/2013 1803   CREATININE 3.40* 03/22/2013 1803   CREATININE 1.28* 01/05/2011 1632   CALCIUM 9.6 03/22/2013 1803   CALCIUM 9.5 07/28/2011 1540   PROT 8.5* 03/22/2013 1803   ALBUMIN 3.0* 03/22/2013 1803   AST 23 03/22/2013 1803   ALT 10 03/22/2013 1803   ALKPHOS 244* 03/22/2013 1803   BILITOT <0.2* 03/22/2013 1803   GFRNONAA 12* 03/22/2013 1803   GFRAA 14* 03/22/2013 1803    No results found for this basename: LIPASE, AMYLASE,  in the last 168 hours No results found for this basename: AMMONIA,  in the last 168 hours  No results found for this basename: CKTOTAL, CKMB, CKMBINDEX, TROPONINI,  in the last 168 hours BNP (last 3 results)  Recent Labs  03/03/13 2334  PROBNP 731.0*    Radiological Exams on Admission: Dg Chest 2 View  03/22/2013   CLINICAL DATA:  Wheezing, shortness of breath  EXAM: CHEST  2 VIEW  COMPARISON:  CT ABD/PELV WO CM dated 03/09/2013; DG CHEST 2 VIEW dated 03/03/2013  FINDINGS: There is mild bilateral interstitial prominence, likely chronic. There is no focal parenchymal opacity, pleural effusion, or pneumothorax. The heart and mediastinal contours are unremarkable.  The osseous structures are unremarkable.  IMPRESSION: There is mild chronic bilateral interstitial thickening.  No active cardiopulmonary disease.   Electronically Signed   By: Kathreen Devoid   On:  03/22/2013 20:13   US Renal  03/22/2013   CLINICAL DATA:  Acute kidney injury. Chronic kidney disease. Bilateral hydronephrosis. Bladder tumor.  EXAM: RENAL/URINARY TRACT ULTRASOUND COMPLETE  COMPARISON:  CT ABD/PELV WO CM dated 03/09/2013; DG CHEST 2 VIEW dated 03/22/2013  FINDINGS: Right Kidney:  Length: 11.5 cm. Severe hydronephrosis. Dilation of the renal pelvis extends into the proximal ureter. If probably no interval change compared to prior CT.  Left Kidney:  Length: 9.4 cm. Moderate hydronephrosis extending in the anatomic pelvis. Hydroureter is present.  Bladder:  Echogenic mass is present limb posterior urinary bladder with calcifications. Findings are suggestive of transitional cell carcinoma. This measures 21 mm and correlates well with would mass in the bladder seen on prior CT.  IMPRESSION: 1. Severe right and moderate left hydronephrosis. Bilateral hydroureter. 2. 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 Partially calcified bladder stone considered less likely.   Electronically Signed   By: Dereck Ligas M.D.   On: 03/22/2013 20:34     Assessment/Plan Principal Problem:   Acute-on-chronic kidney injury Active Problems:   History of pulmonary embolus (PE)   Diabetes mellitus   Hypertension   Open wound of abdominal wall without complication   Smoking   Obstructive uropathy   Bladder tumor   Bilateral hydronephrosis   1. Acute-on-chronic kidney injury The patient is presenting with progressively worsening renal function likely secondary to of structured uropathy due to bladder tumor. Urologist has evaluated the patient and recommend to admit the patient under hospitalist service. Patient will be scheduled for eye ER guided stenting and for the procedure. Neurology has recommended to transition her to heparin. Patient has not taken her warfarin since last one day. At present the patient will be gently hydrated with IV fluids.  Ultrasound of the kidney does show progressively worsening hydronephrosis without any significant urinary retention in her bladder. Continue to monitor, n.p.o. after  midnight. Pharmacy consult for heparin. Hold warfarin.  2. Diabetes mellitus Since the patient will be n.p.o. I would reduce her Lantus from 30 units to 15, place him on sliding scale  3. Hypertension, chronic diastolic dysfunction Holding Lasix and reducing the Toprol to 50 daily at present, continue clonidine.  4. Active smoking, bilateral wheezing Patient has history of COPD but does not appear in active exacerbation. I will place her on duo nebs.  5. Hypothyroidism Continue Synthroid  Consults: Urology  Heparin Nutrition: Renal diet n.p.o. after midnight  Code Status: DNR/DNI  Family Communication: Family was present at bedside, opportunity was given to ask question and all questions were answered satisfactorily at the time of interview. Disposition: Admitted to inpatient in med-surge unit.  Author: Berle Mull, MD [facility] Pager: [phone]  If 7PM-7AM, please contact night-coverage [email or web address] Password [email or web address]

## 2013-03-24 ENCOUNTER — Inpatient Hospital Stay (HOSPITAL_COMMUNITY): Payer: BC Managed Care – PPO

## 2013-03-24 ENCOUNTER — Other Ambulatory Visit: Payer: Self-pay | Admitting: Urology

## 2013-03-24 DIAGNOSIS — D649 Anemia, unspecified: Secondary | ICD-10-CM

## 2013-03-24 LAB — GLUCOSE, CAPILLARY
GLUCOSE-CAPILLARY: 94 mg/dL (ref 70–99)
GLUCOSE-CAPILLARY: 78 mg/dL (ref 70–99)
Glucose-Capillary: 74 mg/dL (ref 70–99)
Glucose-Capillary: 81 mg/dL (ref 70–99)
Glucose-Capillary: 80 mg/dL (ref 70–99)
Glucose-Capillary: 80 mg/dL (ref 70–99)

## 2013-03-24 LAB — CBC
HCT: 24.2 % — ABNORMAL LOW (ref 36.0–46.0)
Hemoglobin: 7.5 g/dL — ABNORMAL LOW (ref 12.0–15.0)
MCH: 26.7 pg (ref 26.0–34.0)
MCHC: 31 g/dL (ref 30.0–36.0)
MCV: 86.1 fL (ref 78.0–100.0)
Platelets: 401 10*3/uL — ABNORMAL HIGH (ref 150–400)
RBC: 2.81 MIL/uL — AB (ref 3.87–5.11)
RDW: 16.6 % — ABNORMAL HIGH (ref 11.5–15.5)
WBC: 8.9 10*3/uL (ref 4.0–10.5)

## 2013-03-24 LAB — PROTIME-INR
INR: 1.97 — ABNORMAL HIGH (ref 0.00–1.49)
INR: 1.61 — ABNORMAL HIGH (ref 0.00–1.49)
Prothrombin Time: 21.8 seconds — ABNORMAL HIGH (ref 11.6–15.2)
Prothrombin Time: 18.7 seconds — ABNORMAL HIGH (ref 11.6–15.2)

## 2013-03-24 LAB — PREPARE FRESH FROZEN PLASMA
UNIT DIVISION: 0
Unit division: 0
Unit division: 0

## 2013-03-24 LAB — BASIC METABOLIC PANEL
BUN: 54 mg/dL — ABNORMAL HIGH (ref 6–23)
CALCIUM: 9 mg/dL (ref 8.4–10.5)
CO2: 22 mEq/L (ref 19–32)
CREATININE: 4.49 mg/dL — AB (ref 0.50–1.10)
Chloride: 101 mEq/L (ref 96–112)
GFR calc Af Amer: 10 mL/min — ABNORMAL LOW (ref >90)
GFR, EST NON AFRICAN AMERICAN: 9 mL/min — AB (ref >90)
Glucose, Bld: 89 mg/dL (ref 70–99)
Potassium: 5.4 mEq/L — ABNORMAL HIGH (ref 3.7–5.3)
SODIUM: 136 meq/L — AB (ref 137–147)

## 2013-03-24 LAB — MRSA PCR SCREENING: MRSA by PCR: NEGATIVE

## 2013-03-24 MED ORDER — FENTANYL CITRATE 0.05 MG/ML IJ SOLN
INTRAMUSCULAR | Status: AC | PRN
  Administered 2013-03-24: 25 ug via INTRAVENOUS

## 2013-03-24 MED ORDER — DIPHENHYDRAMINE HCL 50 MG/ML IJ SOLN
INTRAMUSCULAR | Status: AC | PRN
  Administered 2013-03-24: 25 mg via INTRAVENOUS

## 2013-03-24 MED ORDER — CEFAZOLIN SODIUM-DEXTROSE 2-3 GM-% IV SOLR
2.0000 g | Freq: Once | INTRAVENOUS | Status: AC
  Administered 2013-03-24: 2 g via INTRAVENOUS
  Filled 2013-03-24: qty 50

## 2013-03-24 MED ORDER — MIDAZOLAM HCL 2 MG/2ML IJ SOLN
INTRAMUSCULAR | Status: AC | PRN
  Administered 2013-03-24: 0.5 mg via INTRAVENOUS

## 2013-03-24 MED ORDER — MIDAZOLAM HCL 2 MG/2ML IJ SOLN
INTRAMUSCULAR | Status: AC
  Filled 2013-03-24: qty 6

## 2013-03-24 MED ORDER — HEPARIN (PORCINE) IN NACL 100-0.45 UNIT/ML-% IJ SOLN
900.0000 [IU]/h | INTRAMUSCULAR | Status: DC
Start: 2013-03-24 — End: 2013-03-25
  Administered 2013-03-24: 900 [IU]/h via INTRAVENOUS
  Filled 2013-03-24: qty 250

## 2013-03-24 MED ORDER — FENTANYL CITRATE 0.05 MG/ML IJ SOLN
INTRAMUSCULAR | Status: AC
  Filled 2013-03-24: qty 6

## 2013-03-24 MED ORDER — CEFAZOLIN SODIUM-DEXTROSE 2-3 GM-% IV SOLR
INTRAVENOUS | Status: AC
  Administered 2013-03-24: 2 g via INTRAVENOUS
  Filled 2013-03-24: qty 50

## 2013-03-24 MED ORDER — IOHEXOL 300 MG/ML  SOLN
25.0000 mL | Freq: Once | INTRAMUSCULAR | Status: AC | PRN
  Administered 2013-03-24: 1 mL

## 2013-03-24 MED ORDER — HEPARIN (PORCINE) IN NACL 100-0.45 UNIT/ML-% IJ SOLN
900.0000 [IU]/h | INTRAMUSCULAR | Status: DC
  Administered 2013-03-24: 900 [IU]/h via INTRAVENOUS
  Filled 2013-03-24: qty 250

## 2013-03-24 MED ORDER — DIPHENHYDRAMINE HCL 50 MG/ML IJ SOLN
INTRAMUSCULAR | Status: AC
  Filled 2013-03-24: qty 1

## 2013-03-24 MED ORDER — LIDOCAINE HCL 1 % IJ SOLN
INTRAMUSCULAR | Status: AC
  Filled 2013-03-24: qty 40

## 2013-03-24 NOTE — ED Provider Notes (Signed)
Medical screening examination/treatment/procedure(s) were conducted as a shared visit with non-physician practitioner(s) and myself.  I personally evaluated the patient during the encounter.   EKG Interpretation   Date/Time:  Wednesday March 22 2013 16:35:35 EST Ventricular Rate:  90 PR Interval:  148 QRS Duration: 80 QT Interval:  470 QTC Calculation: 575 R Axis:   50 Text Interpretation:  Sinus rhythm Borderline repolarization abnormality  Prolonged QT interval Nonspecific ST T waves a little worse than prior  Confirmed by Rolla Servidio  MD, Ebert Forrester ([id number]) on 03/22/2013 4:46:06 PM      Patient here with likely obstructive renal failure. She appears well but will need admission with urology consult.  Ephraim Hamburger, MD 03/24/13 [phone]

## 2013-03-24 NOTE — Progress Notes (Signed)
Patient back from IR. FFP transfusion completed in IR. No reaction suspected. Vitals stable.

## 2013-03-24 NOTE — Progress Notes (Signed)
IR PA aware of today's INR 1.97, 2 additional units of FFP ordered today by urology, IR will recheck INR after 1 unit has been transfused and will discontinue IV heparin at that time as well. I have spoke to the RN about this plan.  Tsosie Billing PA-C Interventional Radiology  03/24/13  9:00 AM

## 2013-03-24 NOTE — Progress Notes (Signed)
[facility] for Heparin Indication: History of DVT/PE  Allergies  Allergen Reactions  . Motrin [Ibuprofen] Rash  . Sulfa Drugs Cross Reactors Rash  . Iohexol      Code: HIVES, Desc: pt states she has dye allergy, Onset Date: [id number]   . Quinine Derivatives Rash    Patient Measurements: Height: 5\' 1"  (154.9 cm) Weight: 146 lb 9.7 oz (66.5 kg) IBW/kg (Calculated) : 47.8   Vital Signs: Temp: 98.7 F (37.1 C) (03/06 0656) Temp src: Oral (03/06 0656) BP: 100/67 mmHg (03/06 0656) Pulse Rate: 71 (03/06 0656)  Labs:  Recent Labs  03/22/13 1650 03/22/13 1800 03/22/13 1803 03/23/13 0530 03/24/13 0445  HGB 10.0* 11.2*  --  7.6* 7.5*  HCT 31.2* 33.0*  --  25.9* 24.2*  PLT 286  --   --  419* 401*  APTT  --   --   --  47*  --   LABPROT 23.7*  --   --  25.6* 21.8*  INR 2.20*  --   --  2.43* 1.97*  CREATININE  --  3.70* 3.40* 3.79*  --     Estimated Creatinine Clearance: 11.4 ml/min (by C-G formula based on Cr of 3.79).   Medical History: Past Medical History  Diagnosis Date  . Diabetes mellitus   . Hypertension   . Thyroid disease   . Colon cancer   . COPD (chronic obstructive pulmonary disease)   . MRSA infection   . Pulmonary embolus   . Deep vein thrombosis   . Pneumonia   . Rheumatic fever   . Dehydration   . MRSA (methicillin resistant staph aureus) culture positive   . Arthritis   . History of shingles 02/2010  . Colon cancer     Medications:  Prescriptions prior to admission  Medication Sig Dispense Refill  . acetaminophen (TYLENOL) 500 MG tablet Take 1,000 mg by mouth every 8 (eight) hours as needed.       . Calcium Carbonate-Vit D-Min 600-200 MG-UNIT TABS Take 1 tablet by mouth daily.      . Cholecalciferol (D3 ADULT PO) Take 1,000 Units by mouth daily.       . cloNIDine (CATAPRES) 0.1 MG tablet Take 0.1 mg by mouth 2 (two) times daily.      Marland Kitchen ezetimibe (ZETIA) 10 MG tablet Take 10 mg by mouth daily.      .  ferrous sulfate 325 (65 FE) MG tablet Take 325 mg by mouth daily with breakfast.      . furosemide (LASIX) 40 MG tablet Take 40 mg by mouth every other day.       . insulin glargine (LANTUS) 100 UNIT/ML injection Inject 30 Units into the skin at bedtime.      . insulin lispro (HUMALOG) 100 UNIT/ML injection Inject according to sliding scale up to three times per day.      . levothyroxine (LEVOTHROID) 112 MCG tablet Take 1 tablet (112 mcg total) by mouth daily before breakfast.  30 tablet  3  . metoprolol succinate (TOPROL-XL) 50 MG 24 hr tablet Take 50 mg by mouth 2 (two) times daily. Take with or immediately following a meal.      . Multiple Vitamins-Minerals (WOMENS MULTIVITAMIN PLUS PO) Take 1 capsule by mouth.      . ondansetron (ZOFRAN ODT) 4 MG disintegrating tablet Take 1 tablet (4 mg total) by mouth every 8 (eight) hours as needed for nausea or vomiting.  20 tablet  0  .  potassium chloride (KLOR-CON) 20 MEQ packet Take 20 mEq by mouth daily as needed (take only if furosemide has been taken).       . warfarin (COUMADIN) 3 MG tablet Take 3 mg by mouth daily.      . ciprofloxacin (CIPRO) 500 MG tablet Take 1 tablet (500 mg total) by mouth 2 (two) times daily.  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 Plan is for IR placement of nephrostomy tubes and eventual TURBT.   Original order was for pharmacy to begin heparin when INR < 2 but patient is tentatively scheduled for the IR procedure today.   INR 1.97 after 2 units FFP yesterday, has an additional 2 units FFP ordered for today.  Discussed with attending MD (Dr. Wyline Copas), received orders for pharmacy to proceed with initiation of heparin this AM.   Interventional radiology will need to provide orders and guidance on timing of heparin interruption if nephrostomy tube placement does take place today.  Goal of Therapy :   Heparin level 0.3-0.7 units/ml Monitor platelets by anticoagulation protocol: Yes   Plan:  1.  Heparin 900 units/hr IV infusion (no loading dose). 2.  Await word from IR on when to interrupt heparin for possible procedure today. 3.  If heparin is not interrupted, check heparin level at 5pm 4.  Daily heparin level and CBC 5.  Monitor for worsening/recurrence of hematuria  Clayburn Pert, PharmD, BCPS Pager: [phone] 03/24/2013  8:22 AM

## 2013-03-24 NOTE — Progress Notes (Signed)
The patient had percutaneous tubes placed today by Dr. Laurence Ferrari in interventional radiology. She is still somnolent, but comfortable.  I have her scheduled for cystoscopy, TURBT, bilateral retrogrades and possible ureteroscopy is on Monday, mid day. It is okay to resume diet, but she should be n.p.o. after midnight on Sunday. I think it worthwhile to restart her IV for hydration Sunday evening. She can be started back on Lovenox, or heparin, but I do not want her on this after 4 PM on Sunday.

## 2013-03-24 NOTE — Progress Notes (Signed)
UR completed. Shaneta Cervenka RN CCM Case Mgmt phone [phone] 

## 2013-03-24 NOTE — Progress Notes (Signed)
[facility] PROGRESS NOTE  Heather [name] RWE:[id number] DOB: 01/17/1941 DOA: 03/22/2013 PCP: Rogene Houston, MD  Assessment/Plan: 1. 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  - Appreciate Urology recs - plan for TURBT next week - Pt receiving FFP to reverse coumadin levels in anticipation for nephrostomy tube placement 2. Diabetes mellitus  Since the patient will be n.p.o., Lantus reduced from 30 units to 15, placed on sliding scale  3. Hypertension, chronic diastolic dysfunction  - Holding Lasix and reducing the Toprol to 50 daily at present, continue clonidine.  4. Active smoking, bilateral wheezing  -cont duonebs PRN 5. Hypothyroidism  Continue Synthroid as tolerated  Code Status: DNR Family Communication: Pt and family in room (indicate person spoken with, relationship, and if by phone, the number) Disposition Plan: Pending   Consultants:  Urology  Procedures:    Antibiotics:    HPI/Subjective: No complaints. No acute events noted overnight.  Objective: Filed Vitals:   03/24/13 0145 03/24/13 0656 03/24/13 0959 03/24/13 1000  BP: 100/63 100/67 122/69 122/69  Pulse: 73 71 81   Temp: 98.6 F (37 C) 98.7 F (37.1 C)    TempSrc: Oral Oral    Resp: 16 16    Height:      Weight:      SpO2: 94% 91%      Intake/Output Summary (Last 24 hours) at 03/24/13 1341 Last data filed at 03/24/13 1045  Gross per 24 hour  Intake  622.5 ml  Output      0 ml  Net  622.5 ml   Filed Weights   03/22/13 2319  Weight: 66.5 kg (146 lb 9.7 oz)    Exam:   General:  Awake, in nad  Cardiovascular: regular, s1, s2  Respiratory: normal resp effort, no wheezing  Abdomen: soft, nondistended  Musculoskeletal: perfused, no clubbing   Data Reviewed: Basic Metabolic Panel:  Recent Labs Lab 03/22/13 1800 03/22/13 1803 03/23/13 0530  NA  137 136* 136*  K 5.6* 5.0 5.5*  CL 106 98 102  CO2  --  22 22  GLUCOSE 98 101* 133*  BUN 63* 53* 55*  CREATININE 3.70* 3.40* 3.79*  CALCIUM  --  9.6 8.6   Liver Function Tests:  Recent Labs Lab 03/22/13 1803 03/23/13 0530  AST 23 17  ALT 10 7  ALKPHOS 244* 185*  BILITOT <0.2* <0.2*  PROT 8.5* 6.5  ALBUMIN 3.0* 2.2*   No results found for this basename: LIPASE, AMYLASE,  in the last 168 hours No results found for this basename: AMMONIA,  in the last 168 hours CBC:  Recent Labs Lab 03/22/13 1650 03/22/13 1800 03/23/13 0530 03/24/13 0445  WBC 9.9  --  9.2 8.9  NEUTROABS 6.5  --   --   --   HGB 10.0* 11.2* 7.6* 7.5*  HCT 31.2* 33.0* 25.9* 24.2*  MCV 85.0  --  86.9 86.1  PLT 286  --  419* 401*   Cardiac Enzymes: No results found for this basename: CKTOTAL, CKMB, CKMBINDEX, TROPONINI,  in the last 168 hours BNP (last 3 results)  Recent Labs  03/03/13 2334  PROBNP 731.0*   CBG:  Recent Labs Lab 03/23/13 2146 03/24/13 0311 03/24/13 0649 03/24/13 0747 03/24/13 1206  GLUCAP 136* 74 81 80 94    Recent Results (from the past 240 hour(s))  URINE CULTURE     Status: None   Collection Time  03/22/13  5:02 PM      Result Value Ref Range Status   Specimen Description URINE, CLEAN CATCH   Final   Special Requests NONE   Final   Culture  Setup Time     Final   Value: 03/23/2013 05:15     Performed at [facility]     Final   Value: >=100,000 COLONIES/ML     Performed at [facility]   Culture     Final   Value: ESCHERICHIA COLI     Performed at [facility]   Report Status PENDING   Incomplete     Studies: Dg Chest 2 View  03/22/2013   CLINICAL DATA:  Wheezing, shortness of breath  EXAM: CHEST  2 VIEW  COMPARISON:  CT ABD/PELV WO CM dated 03/09/2013; DG CHEST 2 VIEW dated 03/03/2013  FINDINGS: There is mild bilateral interstitial prominence, likely chronic. There is no focal parenchymal opacity, pleural effusion, or  pneumothorax. The heart and mediastinal contours are unremarkable.  The osseous structures are unremarkable.  IMPRESSION: There is mild chronic bilateral interstitial thickening.  No active cardiopulmonary disease.   Electronically Signed   By: Kathreen Devoid   On: 03/22/2013 20:13   US Renal  03/22/2013   CLINICAL DATA:  Acute kidney injury. Chronic kidney disease. Bilateral hydronephrosis. Bladder tumor.  EXAM: RENAL/URINARY TRACT ULTRASOUND COMPLETE  COMPARISON:  CT ABD/PELV WO CM dated 03/09/2013; DG CHEST 2 VIEW dated 03/22/2013  FINDINGS: Right Kidney:  Length: 11.5 cm. Severe hydronephrosis. Dilation of the renal pelvis extends into the proximal ureter. If probably no interval change compared to prior CT.  Left Kidney:  Length: 9.4 cm. Moderate hydronephrosis extending in the anatomic pelvis. Hydroureter is present.  Bladder:  Echogenic mass is present limb posterior urinary bladder with calcifications. Findings are suggestive of transitional cell carcinoma. This measures 21 mm and correlates well with would mass in the bladder seen on prior CT.  IMPRESSION: 1. Severe right and moderate left hydronephrosis. Bilateral hydroureter. 2. 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 Partially calcified bladder stone considered less likely.   Electronically Signed   By: Dereck Ligas M.D.   On: 03/22/2013 20:34    Scheduled Meds: . cloNIDine  0.1 mg Oral BID  . ezetimibe  10 mg Oral Daily  . feeding supplement (GLUCERNA SHAKE)  237 mL Oral BID BM  . ferrous sulfate  325 mg Oral Q breakfast  . insulin aspart  0-9 Units Subcutaneous Q4H  . insulin glargine  15 Units Subcutaneous QHS  . levothyroxine  112 mcg Oral QAC breakfast  . metoprolol succinate  50 mg Oral Daily   Continuous Infusions: . sodium chloride 75 mL/hr at 03/23/13 0702  . heparin 900 Units/hr (03/24/13 1001)    Principal Problem:   Acute-on-chronic kidney injury Active Problems:    History of pulmonary embolus (PE)   Diabetes mellitus   Hypertension   Open wound of abdominal wall without complication   Smoking   Obstructive uropathy   Bladder tumor   Bilateral hydronephrosis  Time spent: 96min  CH[facility], [facility] Pager [phone]. If 7PM-7AM, please contact night-coverage at [email or web address], password [email or web address] 03/24/2013, 1:41 PM  LOS: 2 days

## 2013-03-24 NOTE — Procedures (Signed)
Interventional Radiology Procedure Note  Procedure:  1.) Placement of RIGHT 46F PCN. 2.) Placement of LEFT 46F PCN. Complications: None Recommendations: - Maintain to bag drainage - Follow Cr and H&H - Hematuria should clear over next few days - Resume heparin in 4 hrs  Signed,  Criselda Peaches, MD [facility]

## 2013-03-24 NOTE — Progress Notes (Signed)
Subjective: Patient reports that she is feeling a bit better. She has less pain.  Objective: Vital signs in last 24 hours: Temp:  [98 F (36.7 C)-98.7 F (37.1 C)] 98.7 F (37.1 C) (03/06 0656) Pulse Rate:  [67-85] 71 (03/06 0656) Resp:  [16-17] 16 (03/06 0656) BP: (100-138)/(55-81) 100/67 mmHg (03/06 0656) SpO2:  [85 %-94 %] 91 % (03/06 0656)  Intake/Output from previous day: 03/05 0701 - 03/06 0700 In: 917 [P.O.:360; I.V.:250; Blood:307] Out: -  Intake/Output this shift:    Physical Exam:  Constitutional: Vital signs reviewed. WD WN in NAD   Eyes: PERRL, No scleral icterus.   Cardiovascular: RRR Pulmonary/Chest: Normal effort Abdominal: Soft. Non-tender, non-distended, bowel sounds are normal, no masses, organomegaly, or guarding present.  Genitourinary: Extremities: No cyanosis or edema   Lab Results:  Recent Labs  03/22/13 1800 03/23/13 0530 03/24/13 0445  HGB 11.2* 7.6* 7.5*  HCT 33.0* 25.9* 24.2*   BMET  Recent Labs  03/22/13 1803 03/23/13 0530  NA 136* 136*  K 5.0 5.5*  CL 98 102  CO2 22 22  GLUCOSE 101* 133*  BUN 53* 55*  CREATININE 3.40* 3.79*  CALCIUM 9.6 8.6    Recent Labs  03/22/13 1650 03/23/13 0530 03/24/13 0445  INR 2.20* 2.43* 1.97*   No results found for this basename: LABURIN,  in the last 72 hours Results for orders placed during the hospital encounter of 09/16/09  MRSA PCR SCREENING     Status: Abnormal   Collection Time    09/16/09  5:21 PM      Result Value Ref Range Status   MRSA by PCR   (*) NEGATIVE Final   Value: POSITIVE            The GeneXpert MRSA Assay (FDA     approved for NASAL specimens     only), is one component of a     comprehensive MRSA colonization     surveillance program. It is not     intended to diagnose MRSA     infection nor to guide or     monitor treatment for     MRSA infections. RESULT CALLED TO, READ BACK BY AND VERIFIED WITH: LAUREN RN 18:50 09/16/09 ([email or web address])  CULTURE,  ROUTINE-ABSCESS     Status: None   Collection Time    09/17/09  3:30 PM      Result Value Ref Range Status   Specimen Description ABSCESS ABDOMEN   Final   Special Requests PT ON VANC,ZOSYN   Final   Gram Stain     Final   Value: NO WBC SEEN     NO SQUAMOUS EPITHELIAL CELLS SEEN     NO ORGANISMS SEEN   Culture     Final   Value: MODERATE METHICILLIN RESISTANT STAPHYLOCOCCUS AUREUS     Note: RIFAMPIN AND GENTAMICIN SHOULD NOT BE USED AS SINGLE DRUGS FOR TREATMENT OF STAPH INFECTIONS. This organism DOES NOT demonstrate inducible Clindamycin resistance in vitro.   Report Status 09/21/2009 FINAL   Final   Organism ID, Bacteria METHICILLIN RESISTANT STAPHYLOCOCCUS AUREUS   Final  ANAEROBIC CULTURE     Status: None   Collection Time    09/17/09  3:30 PM      Result Value Ref Range Status   Specimen Description ABSCESS ABDOMEN   Final   Special Requests PT ON VANC,ZOSYN   Final   Gram Stain     Final   Value: NO WBC SEEN     NO  SQUAMOUS EPITHELIAL CELLS SEEN     NO ORGANISMS SEEN   Culture NO ANAEROBES ISOLATED   Final   Report Status 09/22/2009 FINAL   Final    Studies/Results: Dg Chest 2 View  03/22/2013   CLINICAL DATA:  Wheezing, shortness of breath  EXAM: CHEST  2 VIEW  COMPARISON:  CT ABD/PELV WO CM dated 03/09/2013; DG CHEST 2 VIEW dated 03/03/2013  FINDINGS: There is mild bilateral interstitial prominence, likely chronic. There is no focal parenchymal opacity, pleural effusion, or pneumothorax. The heart and mediastinal contours are unremarkable.  The osseous structures are unremarkable.  IMPRESSION: There is mild chronic bilateral interstitial thickening.  No active cardiopulmonary disease.   Electronically Signed   By: Kathreen Devoid   On: 03/22/2013 20:13   US Renal  03/22/2013   CLINICAL DATA:  Acute kidney injury. Chronic kidney disease. Bilateral hydronephrosis. Bladder tumor.  EXAM: RENAL/URINARY TRACT ULTRASOUND COMPLETE  COMPARISON:  CT ABD/PELV WO CM dated 03/09/2013; DG CHEST 2  VIEW dated 03/22/2013  FINDINGS: Right Kidney:  Length: 11.5 cm. Severe hydronephrosis. Dilation of the renal pelvis extends into the proximal ureter. If probably no interval change compared to prior CT.  Left Kidney:  Length: 9.4 cm. Moderate hydronephrosis extending in the anatomic pelvis. Hydroureter is present.  Bladder:  Echogenic mass is present limb posterior urinary bladder with calcifications. Findings are suggestive of transitional cell carcinoma. This measures 21 mm and correlates well with would mass in the bladder seen on prior CT.  IMPRESSION: 1. Severe right and moderate left hydronephrosis. Bilateral hydroureter. 2. 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 Partially calcified bladder stone considered less likely.   Electronically Signed   By: Dereck Ligas M.D.   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 She has associated acute renal insufficiency. She is scheduled for bilateral percutaneous nephrostomy tubes today. Unfortunately, her INR has not corrected yet despite 2 units of fresh frozen plasma. I will order 2 more units to be given. Hopefully, this will correct her coagulopathy in time for her nephrostomy tubes today.    I will look for time next week to perform TURBT and retrograde.   LOS: 2 days   Franchot Gallo M 03/24/2013, 7:46 AM

## 2013-03-24 NOTE — Progress Notes (Signed)
[facility] for Heparin Indication: History of DVT/PE  Allergies  Allergen Reactions  . Motrin [Ibuprofen] Rash  . Sulfa Drugs Cross Reactors Rash  . Iohexol      Code: HIVES, Desc: pt states she has dye allergy, Onset Date: [id number]   . Quinine Derivatives Rash    Patient Measurements: Height: 5\' 1"  (154.9 cm) Weight: 146 lb 9.7 oz (66.5 kg) IBW/kg (Calculated) : 47.8   Vital Signs: Temp: 97.4 F (36.3 C) (03/06 1700) Temp src: Oral (03/06 1700) BP: 133/79 mmHg (03/06 1700) Pulse Rate: 82 (03/06 1700)  Labs:  Recent Labs  03/22/13 1650  03/22/13 1800 03/22/13 1803 03/23/13 0530 03/24/13 0445 03/24/13 1407  HGB 10.0*  --  11.2*  --  7.6* 7.5*  --   HCT 31.2*  --  33.0*  --  25.9* 24.2*  --   PLT 286  --   --   --  419* 401*  --   APTT  --   --   --   --  47*  --   --   LABPROT 23.7*  --   --   --  25.6* 21.8* 18.7*  INR 2.20*  --   --   --  2.43* 1.97* 1.61*  CREATININE  --   < > 3.70* 3.40* 3.79*  --  4.49*  < > = values in this interval not displayed.  Estimated Creatinine Clearance: 9.6 ml/min (by C-G formula based on Cr of 4.49).   Medical History: Past Medical History  Diagnosis Date  . Diabetes mellitus   . Hypertension   . Thyroid disease   . Colon cancer   . COPD (chronic obstructive pulmonary disease)   . MRSA infection   . Pulmonary embolus   . Deep vein thrombosis   . Pneumonia   . Rheumatic fever   . Dehydration   . MRSA (methicillin resistant staph aureus) culture positive   . Arthritis   . History of shingles 02/2010  . Colon cancer     Medications:  Prescriptions prior to admission  Medication Sig Dispense Refill  . acetaminophen (TYLENOL) 500 MG tablet Take 1,000 mg by mouth every 8 (eight) hours as needed.       . Calcium Carbonate-Vit D-Min 600-200 MG-UNIT TABS Take 1 tablet by mouth daily.      . Cholecalciferol (D3 ADULT PO) Take 1,000 Units by mouth daily.       . cloNIDine (CATAPRES)  0.1 MG tablet Take 0.1 mg by mouth 2 (two) times daily.      Marland Kitchen ezetimibe (ZETIA) 10 MG tablet Take 10 mg by mouth daily.      . ferrous sulfate 325 (65 FE) MG tablet Take 325 mg by mouth daily with breakfast.      . furosemide (LASIX) 40 MG tablet Take 40 mg by mouth every other day.       . insulin glargine (LANTUS) 100 UNIT/ML injection Inject 30 Units into the skin at bedtime.      . insulin lispro (HUMALOG) 100 UNIT/ML injection Inject according to sliding scale up to three times per day.      . levothyroxine (LEVOTHROID) 112 MCG tablet Take 1 tablet (112 mcg total) by mouth daily before breakfast.  30 tablet  3  . metoprolol succinate (TOPROL-XL) 50 MG 24 hr tablet Take 50 mg by mouth 2 (two) times daily. Take with or immediately following a meal.      . Multiple  Vitamins-Minerals (WOMENS MULTIVITAMIN PLUS PO) Take 1 capsule by mouth.      . ondansetron (ZOFRAN ODT) 4 MG disintegrating tablet Take 1 tablet (4 mg total) by mouth every 8 (eight) hours as needed for nausea or vomiting.  20 tablet  0  . potassium chloride (KLOR-CON) 20 MEQ packet Take 20 mEq by mouth daily as needed (take only if furosemide has been taken).       . warfarin (COUMADIN) 3 MG tablet Take 3 mg by mouth daily.      . ciprofloxacin (CIPRO) 500 MG tablet Take 1 tablet (500 mg total) by mouth 2 (two) times daily.  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  Patient requiring IR guided bilateral stenting with eventual TURBT (planned for 3/9) so warfarin held, 4 units FFP given, and IV heparin infusion started for bridge.  Patient is now s/p IR placement of nephrostomy tubes and IR recommending to resume heparin 4 hours post-procedure.  Procedure ended at [facility].   INR 1.61 prior to procedure today  Hgb 7.5, Plts high  SCr 4.49, CrCl~9 ml/min, should improve post-stenting  Goal of Therapy :   Heparin level 0.3-0.7 units/ml Monitor platelets by anticoagulation protocol: Yes   Plan:  1.  Resume Heparin 900 units/hr IV infusion (no loading dose) at 2100 (~4 hours post-procedure). 2.  Check heparin level at 0500 3/7. 3.  Daily heparin level and CBC 4.  Monitor for worsening/recurrence of hematuria  Hershal Coria, PharmD, BCPS Pager: [phone] 03/24/2013 8:49 PM

## 2013-03-25 DIAGNOSIS — D494 Neoplasm of unspecified behavior of bladder: Secondary | ICD-10-CM

## 2013-03-25 LAB — PREPARE FRESH FROZEN PLASMA
UNIT DIVISION: 0
UNIT DIVISION: 0

## 2013-03-25 LAB — BASIC METABOLIC PANEL
BUN: 52 mg/dL — ABNORMAL HIGH (ref 6–23)
CALCIUM: 8.4 mg/dL (ref 8.4–10.5)
CO2: 22 mEq/L (ref 19–32)
Chloride: 103 mEq/L (ref 96–112)
Creatinine, Ser: 4.03 mg/dL — ABNORMAL HIGH (ref 0.50–1.10)
GFR calc Af Amer: 12 mL/min — ABNORMAL LOW (ref >90)
GFR, EST NON AFRICAN AMERICAN: 10 mL/min — AB (ref >90)
Glucose, Bld: 150 mg/dL — ABNORMAL HIGH (ref 70–99)
POTASSIUM: 4.8 meq/L (ref 3.7–5.3)
SODIUM: 137 meq/L (ref 137–147)

## 2013-03-25 LAB — CBC
HCT: 23.7 % — ABNORMAL LOW (ref 36.0–46.0)
Hemoglobin: 7.1 g/dL — ABNORMAL LOW (ref 12.0–15.0)
MCH: 26.1 pg (ref 26.0–34.0)
MCHC: 30 g/dL (ref 30.0–36.0)
MCV: 87.1 fL (ref 78.0–100.0)
Platelets: 385 10*3/uL (ref 150–400)
RBC: 2.72 MIL/uL — ABNORMAL LOW (ref 3.87–5.11)
RDW: 16.6 % — ABNORMAL HIGH (ref 11.5–15.5)
WBC: 8.9 10*3/uL (ref 4.0–10.5)

## 2013-03-25 LAB — URINE CULTURE: Colony Count: >=100000

## 2013-03-25 LAB — HEPARIN LEVEL (UNFRACTIONATED): Heparin Unfractionated: <0.1 IU/mL — ABNORMAL LOW (ref 0.30–0.70)

## 2013-03-25 LAB — GLUCOSE, CAPILLARY
Glucose-Capillary: 66 mg/dL — ABNORMAL LOW (ref 70–99)
Glucose-Capillary: 67 mg/dL — ABNORMAL LOW (ref 70–99)
Glucose-Capillary: 105 mg/dL — ABNORMAL HIGH (ref 70–99)
Glucose-Capillary: 145 mg/dL — ABNORMAL HIGH (ref 70–99)
Glucose-Capillary: 143 mg/dL — ABNORMAL HIGH (ref 70–99)
Glucose-Capillary: 169 mg/dL — ABNORMAL HIGH (ref 70–99)
Glucose-Capillary: 197 mg/dL — ABNORMAL HIGH (ref 70–99)

## 2013-03-25 MED ORDER — HEPARIN (PORCINE) IN NACL 100-0.45 UNIT/ML-% IJ SOLN
1150.0000 [IU]/h | INTRAMUSCULAR | Status: DC
  Administered 2013-03-25: 1050 [IU]/h via INTRAVENOUS
  Filled 2013-03-25 (×2): qty 250

## 2013-03-25 MED ORDER — HYDRALAZINE HCL 20 MG/ML IJ SOLN
10.0000 mg | INTRAMUSCULAR | Status: DC | PRN
  Administered 2013-03-25: 10 mg via INTRAVENOUS
  Filled 2013-03-25: qty 0.5

## 2013-03-25 MED ORDER — HEPARIN (PORCINE) IN NACL 100-0.45 UNIT/ML-% IJ SOLN
1600.0000 [IU]/h | INTRAMUSCULAR | Status: DC
  Administered 2013-03-25: 1400 [IU]/h via INTRAVENOUS
  Administered 2013-03-26: 1600 [IU]/h via INTRAVENOUS
  Filled 2013-03-25 (×2): qty 250

## 2013-03-25 MED ORDER — DEXTROSE 5 % IV SOLN
1.0000 g | INTRAVENOUS | Status: AC
  Administered 2013-03-25 – 2013-03-26 (×2): 1 g via INTRAVENOUS
  Filled 2013-03-25 (×2): qty 10

## 2013-03-25 MED ORDER — INSULIN GLARGINE 100 UNIT/ML ~~LOC~~ SOLN
30.0000 [IU] | Freq: Every day | SUBCUTANEOUS | Status: DC
  Administered 2013-03-25 – 2013-04-02 (×9): 30 [IU] via SUBCUTANEOUS
  Filled 2013-03-25 (×10): qty 0.3

## 2013-03-25 NOTE — Progress Notes (Signed)
[facility] PROGRESS NOTE  THONDA COTTRILL [id number] DOB: [birth year] DOA: 03/22/2013 PCP: Rogene Houston, MD  Assessment/Plan: 1. 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  - Appreciate Urology recs - plan for TURBT next week - s/p nephrostomy tube placement - Renal fx improving 2. Diabetes mellitus  Consider resuming home dose of insulin as pt is eating 3. Hypertension, chronic diastolic dysfunction  - Holding Lasix and reduced the Toprol to 50 daily at present, continue clonidine.  4. Active smoking, bilateral wheezing  -cont duonebs PRN 5. Hypothyroidism  Continue Synthroid as tolerated  Code Status: DNR Family Communication: Pt and family in room (indicate person spoken with, relationship, and if by phone, the number) Disposition Plan: Pending   Consultants:  Urology  Procedures:    Antibiotics:    HPI/Subjective: No complaints. No acute events noted overnight.  Objective: Filed Vitals:   03/24/13 1700 03/24/13 2103 03/25/13 0500 03/25/13 0647  BP: 133/79 117/71  125/71  Pulse: 82 70  72  Temp: 97.4 F (36.3 C) 98.1 F (36.7 C)  98.7 F (37.1 C)  TempSrc: Oral Oral  Oral  Resp: 15 18  18   Height:      Weight:   71.759 kg (158 lb 3.2 oz)   SpO2: 96% 99%  99%    Intake/Output Summary (Last 24 hours) at 03/25/13 1501 Last data filed at 03/25/13 0840  Gross per 24 hour  Intake  24.17 ml  Output   1095 ml  Net -1070.83 ml   Filed Weights   03/22/13 2319 03/25/13 0500  Weight: 66.5 kg (146 lb 9.7 oz) 71.759 kg (158 lb 3.2 oz)    Exam:   General:  Awake, in nad  Cardiovascular: regular, s1, s2  Respiratory: normal resp effort, no wheezing  Abdomen: soft, nondistended  Musculoskeletal: perfused, no clubbing   Data Reviewed: Basic Metabolic Panel:  Recent Labs Lab 03/22/13 1800 03/22/13 1803  03/23/13 0530 03/24/13 1407 03/25/13 0439  NA 137 136* 136* 136* 137  K 5.6* 5.0 5.5* 5.4* 4.8  CL 106 98 102 101 103  CO2  --  22 22 22 22   GLUCOSE 98 101* 133* 89 150*  BUN 63* 53* 55* 54* 52*  CREATININE 3.70* 3.40* 3.79* 4.49* 4.03*  CALCIUM  --  9.6 8.6 9.0 8.4   Liver Function Tests:  Recent Labs Lab 03/22/13 1803 03/23/13 0530  AST 23 17  ALT 10 7  ALKPHOS 244* 185*  BILITOT <0.2* <0.2*  PROT 8.5* 6.5  ALBUMIN 3.0* 2.2*   No results found for this basename: LIPASE, AMYLASE,  in the last 168 hours No results found for this basename: AMMONIA,  in the last 168 hours CBC:  Recent Labs Lab 03/22/13 1650 03/22/13 1800 03/23/13 0530 03/24/13 0445 03/25/13 0439  WBC 9.9  --  9.2 8.9 8.9  NEUTROABS 6.5  --   --   --   --   HGB 10.0* 11.2* 7.6* 7.5* 7.1*  HCT 31.2* 33.0* 25.9* 24.2* 23.7*  MCV 85.0  --  86.9 86.1 87.1  PLT 286  --  419* 401* 385   Cardiac Enzymes: No results found for this basename: CKTOTAL, CKMB, CKMBINDEX, TROPONINI,  in the last 168 hours BNP (last 3 results)  Recent Labs  03/03/13 2334  PROBNP 731.0*   CBG:  Recent Labs Lab 03/25/13 0252 03/25/13 0312 03/25/13 0336 03/25/13 0758 03/25/13 1214  GLUCAP 67* 66* 105* 145* 143*    Recent Results (from the past 240 hour(s))  URINE CULTURE     Status: None   Collection Time    03/22/13  5:02 PM      Result Value Ref Range Status   Specimen Description URINE, CLEAN CATCH   Final   Special Requests NONE   Final   Culture  Setup Time     Final   Value: 03/23/2013 05:15     Performed at [place]     Final   Value: >=100,000 COLONIES/ML     Performed at [facility]   Culture     Final   Value: ESCHERICHIA COLI     Performed at [facility]   Report Status 03/25/2013 FINAL   Final   Organism ID, Bacteria ESCHERICHIA COLI   Final  MRSA PCR SCREENING     Status: None   Collection Time    03/24/13  2:05 PM      Result Value Ref Range  Status   MRSA by PCR NEGATIVE  NEGATIVE Final   Comment:            The GeneXpert MRSA Assay (FDA     approved for NASAL specimens     only), is one component of a     comprehensive MRSA colonization     surveillance program. It is not     intended to diagnose MRSA     infection nor to guide or     monitor treatment for     MRSA infections.     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 She is currently admitted with acute on chronic renal failure which is thought to be exacerbated by the underlying obstructed uropathy. Bilateral percutaneous nephrostomy tubes are warranted for decompression in an effort to salvage renal function. She was anticoagulated on Coumadin at her initial presentation. She has so far received 3 units of FFP and her INR is currently 1.6. A 4th unit of FFP is currently running and will be continued throughout the procedure.  EXAM: IR ULTRASOUND GUIDANCE TISSUE ABLATION; PERC NEPHROSTOMY*L*; PERC NEPHROSTOMY*R*  Date: 03/24/2013  TECHNIQUE: Informed consent was obtained from the patient following explanation of the procedure, risks, benefits and alternatives. The patient understands, agrees and consents for the procedure. All questions were addressed. A time out was performed.  Maximal barrier sterile technique utilized including caps, mask, sterile gowns, sterile gloves, large sterile drape, hand hygiene, and Betadine skin prep.  The left flank was interrogated with ultrasound. The hydronephrotic left kidney was successfully identified. An appropriate skin entry site overlying a posterior calyx was identified and marked. Local anesthesia was attained by infiltration with 1% lidocaine. A small dermatotomy was made. Under real-time sonographic guidance, a 21 gauge Accustick needle was used to puncture a posterior interpolar calyx. Clear urine returned through the needle. An image was obtained and stored for  the medical record. The 0.018 inch wire was advanced into the renal collecting system. The Accustick sheath was then advanced over the wire and positioned within the renal pelvis. Position was confirmed with a gentle hand injection of contrast material. A Rosen wire was advanced into the renal pelvis. The tract was then subsequently dilated to 10 [place] and a [place] all-purpose drain advanced over the wire and positioned with the locking loop in the renal pelvis. A final  image was obtained and stored. The tube was secured to the skin with 0 Prolene suture and a plastic bumper. The tube was placed to gravity drainage. The patient tolerated the procedure well.  Attention was next turned to the right flank. The right flank was interrogated with ultrasound. The hydronephrotic right kidney was successfully identified. An appropriate skin entry site overlying a posterior caval ex was identified and marked. Local anesthesia was attained by infiltration with 1% lidocaine. A small dermatotomy was made. Under real-time sonographic guidance, a 21 gauge Accustick needle was used to puncture a posterior lower pole calyx. Clear urine returned through the needle. The 0.018 inch wire was advanced into the renal pelvis. The Accustick needle was exchanged for the Accustick sheath. Position within the renal collecting system was confirmed by gentle hand injection of contrast material. A J-wire was advanced through the Accustick sheath and coiled within the pelvis. The Accustick sheath was removed. The skin tract was dilated to 10 [place] and a [place] all-purpose drainage catheter was advanced over the wire and positioned with the locking loop in the renal pelvis. The tube was secured to the skin with 0 Prolene suture and connected to gravity bag drainage. The patient tolerated the procedure well without evidence of complication.  ANESTHESIA/SEDATION: Moderate (conscious) sedation was used. 0.5 mg Versed, 25 mcg Fentanyl  were administered intravenously. The patient's vital signs were monitored continuously by radiology nursing throughout the procedure. 25 mg of Benadryl was also administered intravenously an addition to 2 g Ancef within 1 hr of skin incision.  Sedation Time: 13 minutes  CONTRAST:  10 cc Omnipaque 300  FLUOROSCOPY TIME:  54 seconds  PROCEDURE: 1. Ultrasound-guided puncture of the left renal collecting system 2. Placement of a 10 French percutaneous nephrostomy tube under fluoroscopic guidance 3. Ultrasound guided puncture of the right renal collecting system 4. Placement of a 10 French percutaneous nephrostomy tube under fluoroscopic guidance Interventional Radiologist:  Criselda Peaches, MD  IMPRESSION: Successful placement of bilateral 10 French percutaneous nephrostomy tubes.  Return to interventional radiology in 4 weeks for routine bilateral tube check and change. At that time, an attempt could be made to internalize to double-J ureteral stents, or nephro ureteral stents.  Signed,  Criselda Peaches, MD  [facility]  [facility]   Electronically Signed   By: Jacqulynn Cadet M.D.   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 She is currently admitted with acute on chronic renal failure which is thought to be exacerbated by the underlying obstructed uropathy. Bilateral percutaneous nephrostomy tubes are warranted for decompression in an effort to salvage renal function. She was anticoagulated on Coumadin at her initial presentation. She has so far received 3 units of FFP and her INR is currently 1.6. A 4th unit of FFP is currently running and will be continued throughout the procedure.  EXAM: IR ULTRASOUND GUIDANCE TISSUE ABLATION; PERC NEPHROSTOMY*L*; PERC NEPHROSTOMY*R*  Date: 03/24/2013  TECHNIQUE: Informed consent was obtained from the patient following explanation of  the procedure, risks, benefits and alternatives. The patient understands, agrees and consents for the procedure. All questions were addressed. A time out was performed.  Maximal barrier sterile technique utilized including caps, mask, sterile gowns, sterile gloves, large sterile drape, hand hygiene, and Betadine skin prep.  The left flank was interrogated with ultrasound. The hydronephrotic left kidney was successfully identified. An appropriate skin entry  site overlying a posterior calyx was identified and marked. Local anesthesia was attained by infiltration with 1% lidocaine. A small dermatotomy was made. Under real-time sonographic guidance, a 21 gauge Accustick needle was used to puncture a posterior interpolar calyx. Clear urine returned through the needle. An image was obtained and stored for the medical record. The 0.018 inch wire was advanced into the renal collecting system. The Accustick sheath was then advanced over the wire and positioned within the renal pelvis. Position was confirmed with a gentle hand injection of contrast material. A Rosen wire was advanced into the renal pelvis. The tract was then subsequently dilated to 10 [place] and a [place] all-purpose drain advanced over the wire and positioned with the locking loop in the renal pelvis. A final image was obtained and stored. The tube was secured to the skin with 0 Prolene suture and a plastic bumper. The tube was placed to gravity drainage. The patient tolerated the procedure well.  Attention was next turned to the right flank. The right flank was interrogated with ultrasound. The hydronephrotic right kidney was successfully identified. An appropriate skin entry site overlying a posterior caval ex was identified and marked. Local anesthesia was attained by infiltration with 1% lidocaine. A small dermatotomy was made. Under real-time sonographic guidance, a 21 gauge Accustick needle was used to puncture a posterior lower pole calyx.  Clear urine returned through the needle. The 0.018 inch wire was advanced into the renal pelvis. The Accustick needle was exchanged for the Accustick sheath. Position within the renal collecting system was confirmed by gentle hand injection of contrast material. A J-wire was advanced through the Accustick sheath and coiled within the pelvis. The Accustick sheath was removed. The skin tract was dilated to 10 [place] and a [place] all-purpose drainage catheter was advanced over the wire and positioned with the locking loop in the renal pelvis. The tube was secured to the skin with 0 Prolene suture and connected to gravity bag drainage. The patient tolerated the procedure well without evidence of complication.  ANESTHESIA/SEDATION: Moderate (conscious) sedation was used. 0.5 mg Versed, 25 mcg Fentanyl were administered intravenously. The patient's vital signs were monitored continuously by radiology nursing throughout the procedure. 25 mg of Benadryl was also administered intravenously an addition to 2 g Ancef within 1 hr of skin incision.  Sedation Time: 13 minutes  CONTRAST:  10 cc Omnipaque 300  FLUOROSCOPY TIME:  54 seconds  PROCEDURE: 1. Ultrasound-guided puncture of the left renal collecting system 2. Placement of a 10 French percutaneous nephrostomy tube under fluoroscopic guidance 3. Ultrasound guided puncture of the right renal collecting system 4. Placement of a 10 French percutaneous nephrostomy tube under fluoroscopic guidance Interventional Radiologist:  Criselda Peaches, MD  IMPRESSION: Successful placement of bilateral 10 French percutaneous nephrostomy tubes.  Return to interventional radiology in 4 weeks for routine bilateral tube check and change. At that time, an attempt could be made to internalize to double-J ureteral stents, or nephro ureteral stents.  Signed,  Criselda Peaches, MD  [facility]  [facility]   Electronically Signed   By:  Jacqulynn Cadet M.D.   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 She is currently admitted with acute on chronic renal failure which is thought to be exacerbated by the underlying obstructed uropathy. Bilateral percutaneous nephrostomy tubes are warranted for decompression in an  effort to salvage renal function. She was anticoagulated on Coumadin at her initial presentation. She has so far received 3 units of FFP and her INR is currently 1.6. A 4th unit of FFP is currently running and will be continued throughout the procedure.  EXAM: IR ULTRASOUND GUIDANCE TISSUE ABLATION; PERC NEPHROSTOMY*L*; PERC NEPHROSTOMY*R*  Date: 03/24/2013  TECHNIQUE: Informed consent was obtained from the patient following explanation of the procedure, risks, benefits and alternatives. The patient understands, agrees and consents for the procedure. All questions were addressed. A time out was performed.  Maximal barrier sterile technique utilized including caps, mask, sterile gowns, sterile gloves, large sterile drape, hand hygiene, and Betadine skin prep.  The left flank was interrogated with ultrasound. The hydronephrotic left kidney was successfully identified. An appropriate skin entry site overlying a posterior calyx was identified and marked. Local anesthesia was attained by infiltration with 1% lidocaine. A small dermatotomy was made. Under real-time sonographic guidance, a 21 gauge Accustick needle was used to puncture a posterior interpolar calyx. Clear urine returned through the needle. An image was obtained and stored for the medical record. The 0.018 inch wire was advanced into the renal collecting system. The Accustick sheath was then advanced over the wire and positioned within the renal pelvis. Position was confirmed with a gentle hand injection of contrast material. A Rosen wire was advanced into the renal pelvis. The tract was then  subsequently dilated to 10 [place] and a [place] all-purpose drain advanced over the wire and positioned with the locking loop in the renal pelvis. A final image was obtained and stored. The tube was secured to the skin with 0 Prolene suture and a plastic bumper. The tube was placed to gravity drainage. The patient tolerated the procedure well.  Attention was next turned to the right flank. The right flank was interrogated with ultrasound. The hydronephrotic right kidney was successfully identified. An appropriate skin entry site overlying a posterior caval ex was identified and marked. Local anesthesia was attained by infiltration with 1% lidocaine. A small dermatotomy was made. Under real-time sonographic guidance, a 21 gauge Accustick needle was used to puncture a posterior lower pole calyx. Clear urine returned through the needle. The 0.018 inch wire was advanced into the renal pelvis. The Accustick needle was exchanged for the Accustick sheath. Position within the renal collecting system was confirmed by gentle hand injection of contrast material. A J-wire was advanced through the Accustick sheath and coiled within the pelvis. The Accustick sheath was removed. The skin tract was dilated to 10 [place] and a [place] all-purpose drainage catheter was advanced over the wire and positioned with the locking loop in the renal pelvis. The tube was secured to the skin with 0 Prolene suture and connected to gravity bag drainage. The patient tolerated the procedure well without evidence of complication.  ANESTHESIA/SEDATION: Moderate (conscious) sedation was used. 0.5 mg Versed, 25 mcg Fentanyl were administered intravenously. The patient's vital signs were monitored continuously by radiology nursing throughout the procedure. 25 mg of Benadryl was also administered intravenously an addition to 2 g Ancef within 1 hr of skin incision.  Sedation Time: 13 minutes  CONTRAST:  10 cc Omnipaque 300  FLUOROSCOPY TIME:   54 seconds  PROCEDURE: 1. Ultrasound-guided puncture of the left renal collecting system 2. Placement of a 10 French percutaneous nephrostomy tube under fluoroscopic guidance 3. Ultrasound guided puncture of the right renal collecting system 4. Placement of a 10 French percutaneous nephrostomy tube under fluoroscopic  guidance Interventional Radiologist:  Criselda Peaches, MD  IMPRESSION: Successful placement of bilateral 10 French percutaneous nephrostomy tubes.  Return to interventional radiology in 4 weeks for routine bilateral tube check and change. At that time, an attempt could be made to internalize to double-J ureteral stents, or nephro ureteral stents.  Signed,  Criselda Peaches, MD  [facility]  [facility]   Electronically Signed   By: Jacqulynn Cadet M.D.   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 She is currently admitted with acute on chronic renal failure which is thought to be exacerbated by the underlying obstructed uropathy. Bilateral percutaneous nephrostomy tubes are warranted for decompression in an effort to salvage renal function. She was anticoagulated on Coumadin at her initial presentation. She has so far received 3 units of FFP and her INR is currently 1.6. A 4th unit of FFP is currently running and will be continued throughout the procedure.  EXAM: IR ULTRASOUND GUIDANCE TISSUE ABLATION; PERC NEPHROSTOMY*L*; PERC NEPHROSTOMY*R*  Date: 03/24/2013  TECHNIQUE: Informed consent was obtained from the patient following explanation of the procedure, risks, benefits and alternatives. The patient understands, agrees and consents for the procedure. All questions were addressed. A time out was performed.  Maximal barrier sterile technique utilized including caps, mask, sterile gowns, sterile gloves, large sterile drape, hand hygiene, and Betadine skin prep.   The left flank was interrogated with ultrasound. The hydronephrotic left kidney was successfully identified. An appropriate skin entry site overlying a posterior calyx was identified and marked. Local anesthesia was attained by infiltration with 1% lidocaine. A small dermatotomy was made. Under real-time sonographic guidance, a 21 gauge Accustick needle was used to puncture a posterior interpolar calyx. Clear urine returned through the needle. An image was obtained and stored for the medical record. The 0.018 inch wire was advanced into the renal collecting system. The Accustick sheath was then advanced over the wire and positioned within the renal pelvis. Position was confirmed with a gentle hand injection of contrast material. A Rosen wire was advanced into the renal pelvis. The tract was then subsequently dilated to 10 [place] and a [place] all-purpose drain advanced over the wire and positioned with the locking loop in the renal pelvis. A final image was obtained and stored. The tube was secured to the skin with 0 Prolene suture and a plastic bumper. The tube was placed to gravity drainage. The patient tolerated the procedure well.  Attention was next turned to the right flank. The right flank was interrogated with ultrasound. The hydronephrotic right kidney was successfully identified. An appropriate skin entry site overlying a posterior caval ex was identified and marked. Local anesthesia was attained by infiltration with 1% lidocaine. A small dermatotomy was made. Under real-time sonographic guidance, a 21 gauge Accustick needle was used to puncture a posterior lower pole calyx. Clear urine returned through the needle. The 0.018 inch wire was advanced into the renal pelvis. The Accustick needle was exchanged for the Accustick sheath. Position within the renal collecting system was confirmed by gentle hand injection of contrast material. A J-wire was advanced through the Accustick sheath and coiled  within the pelvis. The Accustick sheath was removed. The skin tract was dilated to 10 [place] and a [place] all-purpose drainage catheter was advanced over the wire and positioned with the locking loop in the renal pelvis. The tube was  secured to the skin with 0 Prolene suture and connected to gravity bag drainage. The patient tolerated the procedure well without evidence of complication.  ANESTHESIA/SEDATION: Moderate (conscious) sedation was used. 0.5 mg Versed, 25 mcg Fentanyl were administered intravenously. The patient's vital signs were monitored continuously by radiology nursing throughout the procedure. 25 mg of Benadryl was also administered intravenously an addition to 2 g Ancef within 1 hr of skin incision.  Sedation Time: 13 minutes  CONTRAST:  10 cc Omnipaque 300  FLUOROSCOPY TIME:  54 seconds  PROCEDURE: 1. Ultrasound-guided puncture of the left renal collecting system 2. Placement of a 10 French percutaneous nephrostomy tube under fluoroscopic guidance 3. Ultrasound guided puncture of the right renal collecting system 4. Placement of a 10 French percutaneous nephrostomy tube under fluoroscopic guidance Interventional Radiologist:  Criselda Peaches, MD  IMPRESSION: Successful placement of bilateral 10 French percutaneous nephrostomy tubes.  Return to interventional radiology in 4 weeks for routine bilateral tube check and change. At that time, an attempt could be made to internalize to double-J ureteral stents, or nephro ureteral stents.  Signed,  Criselda Peaches, MD  [facility]  [facility]   Electronically Signed   By: Jacqulynn Cadet M.D.   On: 03/24/2013 17:26    Scheduled Meds: . cloNIDine  0.1 mg Oral BID  . ezetimibe  10 mg Oral Daily  . feeding supplement (GLUCERNA SHAKE)  237 mL Oral BID BM  . ferrous sulfate  325 mg Oral Q breakfast  . insulin aspart  0-9 Units Subcutaneous Q4H  . insulin glargine  15 Units Subcutaneous QHS   . levothyroxine  112 mcg Oral QAC breakfast  . metoprolol succinate  50 mg Oral Daily   Continuous Infusions: . sodium chloride 75 mL/hr at 03/25/13 1015  . heparin 1,150 Units/hr (03/25/13 0720)    Principal Problem:   Acute-on-chronic kidney injury Active Problems:   History of pulmonary embolus (PE)   Diabetes mellitus   Hypertension   Open wound of abdominal wall without complication   Smoking   Obstructive uropathy   Bladder tumor   Bilateral hydronephrosis  Time spent: 23min  Margene Cherian, [place] [facility] Pager [phone]. If 7PM-7AM, please contact night-coverage at [email or web address], password [facility] 03/25/2013, 3:01 PM  LOS: 3 days

## 2013-03-25 NOTE — Progress Notes (Signed)
Wyline Copas, MD gave telephone order for a foot stick.

## 2013-03-25 NOTE — Progress Notes (Signed)
Subjective: Patient reports that she is having back pain, but a different kind of pain than prior to her neph tubes being placed.  The pain she has now is tolerable. Restarted on heparin gtt, right neph tube is bloody. No acute events overnight.  Objective: Vital signs in last 24 hours: Temp:  [97.4 F (36.3 C)-98.7 F (37.1 C)] 98.7 F (37.1 C) (03/07 0647) Pulse Rate:  [70-87] 72 (03/07 0647) Resp:  [15-21] 18 (03/07 0647) BP: (111-146)/(49-79) 125/71 mmHg (03/07 0647) SpO2:  [79 %-100 %] 99 % (03/07 0647) Weight:  [71.759 kg (158 lb 3.2 oz)] 71.759 kg (158 lb 3.2 oz) (03/07 0500)  Intake/Output from previous day: 03/06 0701 - 03/07 0700 In: 386.7 [Blood:386.7] Out: 945 [Urine:945] Intake/Output this shift: Total I/O In: -  Out: 150 [Urine:150]  Physical Exam:  Constitutional: Vital signs reviewed. WD WN in NAD   Eyes: PERRL, No scleral icterus.   Abdominal: Soft. Non-tender, non-distended, bowel sounds are normal, no masses, organomegaly, or guarding present. Neph tube sites are c/d/i.  Right neph tube is draining bloody urine, left neph tube is draining clear urine. Genitourinary: Extremities: clubbing of finger nails  Lab Results:  Recent Labs  03/23/13 0530 03/24/13 0445 03/25/13 0439  HGB 7.6* 7.5* 7.1*  HCT 25.9* 24.2* 23.7*   BMET  Recent Labs  03/24/13 1407 03/25/13 0439  NA 136* 137  K 5.4* 4.8  CL 101 103  CO2 22 22  GLUCOSE 89 150*  BUN 54* 52*  CREATININE 4.49* 4.03*  CALCIUM 9.0 8.4    Recent Labs  03/23/13 0530 03/24/13 0445 03/24/13 1407  INR 2.43* 1.97* 1.61*   No results found for this basename: LABURIN,  in the last 72 hours Results for orders placed during the hospital encounter of 03/22/13  URINE CULTURE     Status: None   Collection Time    03/22/13  5:02 PM      Result Value Ref Range Status   Specimen Description URINE, CLEAN CATCH   Final   Special Requests NONE   Final   Culture  Setup Time     Final   Value:  03/23/2013 05:15     Performed at [place]     Final   Value: >=100,000 COLONIES/ML     Performed at [facility]   Culture     Final   Value: ESCHERICHIA COLI     Performed at [facility]   Report Status 03/25/2013 FINAL   Final   Organism ID, Bacteria ESCHERICHIA COLI   Final  MRSA PCR SCREENING     Status: None   Collection Time    03/24/13  2:05 PM      Result Value Ref Range Status   MRSA by PCR NEGATIVE  NEGATIVE Final   Comment:            The GeneXpert MRSA Assay (FDA     approved for NASAL specimens     only), is one component of a     comprehensive MRSA colonization     surveillance program. It is not     intended to diagnose MRSA     infection nor to guide or     monitor treatment for     MRSA infections.    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 She is currently admitted with acute on chronic renal failure which  is thought to be exacerbated by the underlying obstructed uropathy. Bilateral percutaneous nephrostomy tubes are warranted for decompression in an effort to salvage renal function. She was anticoagulated on Coumadin at her initial presentation. She has so far received 3 units of FFP and her INR is currently 1.6. A 4th unit of FFP is currently running and will be continued throughout the procedure.  EXAM: IR ULTRASOUND GUIDANCE TISSUE ABLATION; PERC NEPHROSTOMY*L*; PERC NEPHROSTOMY*R*  Date: 03/24/2013  TECHNIQUE: Informed consent was obtained from the patient following explanation of the procedure, risks, benefits and alternatives. The patient understands, agrees and consents for the procedure. All questions were addressed. A time out was performed.  Maximal barrier sterile technique utilized including caps, mask, sterile gowns, sterile gloves, large sterile drape, hand hygiene, and Betadine skin prep.  The left flank was interrogated with  ultrasound. The hydronephrotic left kidney was successfully identified. An appropriate skin entry site overlying a posterior calyx was identified and marked. Local anesthesia was attained by infiltration with 1% lidocaine. A small dermatotomy was made. Under real-time sonographic guidance, a 21 gauge Accustick needle was used to puncture a posterior interpolar calyx. Clear urine returned through the needle. An image was obtained and stored for the medical record. The 0.018 inch wire was advanced into the renal collecting system. The Accustick sheath was then advanced over the wire and positioned within the renal pelvis. Position was confirmed with a gentle hand injection of contrast material. A Rosen wire was advanced into the renal pelvis. The tract was then subsequently dilated to 10 [place] and a [place] all-purpose drain advanced over the wire and positioned with the locking loop in the renal pelvis. A final image was obtained and stored. The tube was secured to the skin with 0 Prolene suture and a plastic bumper. The tube was placed to gravity drainage. The patient tolerated the procedure well.  Attention was next turned to the right flank. The right flank was interrogated with ultrasound. The hydronephrotic right kidney was successfully identified. An appropriate skin entry site overlying a posterior caval ex was identified and marked. Local anesthesia was attained by infiltration with 1% lidocaine. A small dermatotomy was made. Under real-time sonographic guidance, a 21 gauge Accustick needle was used to puncture a posterior lower pole calyx. Clear urine returned through the needle. The 0.018 inch wire was advanced into the renal pelvis. The Accustick needle was exchanged for the Accustick sheath. Position within the renal collecting system was confirmed by gentle hand injection of contrast material. A J-wire was advanced through the Accustick sheath and coiled within the pelvis. The Accustick sheath  was removed. The skin tract was dilated to 10 [place] and a [place] all-purpose drainage catheter was advanced over the wire and positioned with the locking loop in the renal pelvis. The tube was secured to the skin with 0 Prolene suture and connected to gravity bag drainage. The patient tolerated the procedure well without evidence of complication.  ANESTHESIA/SEDATION: Moderate (conscious) sedation was used. 0.5 mg Versed, 25 mcg Fentanyl were administered intravenously. The patient's vital signs were monitored continuously by radiology nursing throughout the procedure. 25 mg of Benadryl was also administered intravenously an addition to 2 g Ancef within 1 hr of skin incision.  Sedation Time: 13 minutes  CONTRAST:  10 cc Omnipaque 300  FLUOROSCOPY TIME:  54 seconds  PROCEDURE: 1. Ultrasound-guided puncture of the left renal collecting system 2. Placement of a 10 French percutaneous nephrostomy tube under fluoroscopic guidance 3.  Ultrasound guided puncture of the right renal collecting system 4. Placement of a 10 French percutaneous nephrostomy tube under fluoroscopic guidance Interventional Radiologist:  Criselda Peaches, MD  IMPRESSION: Successful placement of bilateral 10 French percutaneous nephrostomy tubes.  Return to interventional radiology in 4 weeks for routine bilateral tube check and change. At that time, an attempt could be made to internalize to double-J ureteral stents, or nephro ureteral stents.  Signed,  Criselda Peaches, MD  [facility]  [facility]   Electronically Signed   By: Jacqulynn Cadet M.D.   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 She is currently admitted with acute on chronic renal failure which is thought to be exacerbated by the underlying obstructed uropathy. Bilateral percutaneous nephrostomy tubes are warranted for decompression  in an effort to salvage renal function. She was anticoagulated on Coumadin at her initial presentation. She has so far received 3 units of FFP and her INR is currently 1.6. A 4th unit of FFP is currently running and will be continued throughout the procedure.  EXAM: IR ULTRASOUND GUIDANCE TISSUE ABLATION; PERC NEPHROSTOMY*L*; PERC NEPHROSTOMY*R*  Date: 03/24/2013  TECHNIQUE: Informed consent was obtained from the patient following explanation of the procedure, risks, benefits and alternatives. The patient understands, agrees and consents for the procedure. All questions were addressed. A time out was performed.  Maximal barrier sterile technique utilized including caps, mask, sterile gowns, sterile gloves, large sterile drape, hand hygiene, and Betadine skin prep.  The left flank was interrogated with ultrasound. The hydronephrotic left kidney was successfully identified. An appropriate skin entry site overlying a posterior calyx was identified and marked. Local anesthesia was attained by infiltration with 1% lidocaine. A small dermatotomy was made. Under real-time sonographic guidance, a 21 gauge Accustick needle was used to puncture a posterior interpolar calyx. Clear urine returned through the needle. An image was obtained and stored for the medical record. The 0.018 inch wire was advanced into the renal collecting system. The Accustick sheath was then advanced over the wire and positioned within the renal pelvis. Position was confirmed with a gentle hand injection of contrast material. A Rosen wire was advanced into the renal pelvis. The tract was then subsequently dilated to 10 [place] and a [place] all-purpose drain advanced over the wire and positioned with the locking loop in the renal pelvis. A final image was obtained and stored. The tube was secured to the skin with 0 Prolene suture and a plastic bumper. The tube was placed to gravity drainage. The patient tolerated the procedure well.  Attention was  next turned to the right flank. The right flank was interrogated with ultrasound. The hydronephrotic right kidney was successfully identified. An appropriate skin entry site overlying a posterior caval ex was identified and marked. Local anesthesia was attained by infiltration with 1% lidocaine. A small dermatotomy was made. Under real-time sonographic guidance, a 21 gauge Accustick needle was used to puncture a posterior lower pole calyx. Clear urine returned through the needle. The 0.018 inch wire was advanced into the renal pelvis. The Accustick needle was exchanged for the Accustick sheath. Position within the renal collecting system was confirmed by gentle hand injection of contrast material. A J-wire was advanced through the Accustick sheath and coiled within the pelvis. The Accustick sheath was removed. The skin tract was dilated to 10 [place] and a [place] 10 [place] all-purpose drainage  catheter was advanced over the wire and positioned with the locking loop in the renal pelvis. The tube was secured to the skin with 0 Prolene suture and connected to gravity bag drainage. The patient tolerated the procedure well without evidence of complication.  ANESTHESIA/SEDATION: Moderate (conscious) sedation was used. 0.5 mg Versed, 25 mcg Fentanyl were administered intravenously. The patient's vital signs were monitored continuously by radiology nursing throughout the procedure. 25 mg of Benadryl was also administered intravenously an addition to 2 g Ancef within 1 hr of skin incision.  Sedation Time: 13 minutes  CONTRAST:  10 cc Omnipaque 300  FLUOROSCOPY TIME:  54 seconds  PROCEDURE: 1. Ultrasound-guided puncture of the left renal collecting system 2. Placement of a 10 French percutaneous nephrostomy tube under fluoroscopic guidance 3. Ultrasound guided puncture of the right renal collecting system 4. Placement of a 10 French percutaneous nephrostomy tube under fluoroscopic guidance Interventional Radiologist:  Criselda Peaches, MD  IMPRESSION: Successful placement of bilateral 10 French percutaneous nephrostomy tubes.  Return to interventional radiology in 4 weeks for routine bilateral tube check and change. At that time, an attempt could be made to internalize to double-J ureteral stents, or nephro ureteral stents.  Signed,  Criselda Peaches, MD  [facility]  [facility]   Electronically Signed   By: Jacqulynn Cadet M.D.   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 She is currently admitted with acute on chronic renal failure which is thought to be exacerbated by the underlying obstructed uropathy. Bilateral percutaneous nephrostomy tubes are warranted for decompression in an effort to salvage renal function. She was anticoagulated on Coumadin at her initial presentation. She has so far received 3 units of FFP and her INR is currently 1.6. A 4th unit of FFP is currently running and will be continued throughout the procedure.  EXAM: IR ULTRASOUND GUIDANCE TISSUE ABLATION; PERC NEPHROSTOMY*L*; PERC NEPHROSTOMY*R*  Date: 03/24/2013  TECHNIQUE: Informed consent was obtained from the patient following explanation of the procedure, risks, benefits and alternatives. The patient understands, agrees and consents for the procedure. All questions were addressed. A time out was performed.  Maximal barrier sterile technique utilized including caps, mask, sterile gowns, sterile gloves, large sterile drape, hand hygiene, and Betadine skin prep.  The left flank was interrogated with ultrasound. The hydronephrotic left kidney was successfully identified. An appropriate skin entry site overlying a posterior calyx was identified and marked. Local anesthesia was attained by infiltration with 1% lidocaine. A small dermatotomy was made. Under real-time sonographic guidance, a 21 gauge Accustick needle was used  to puncture a posterior interpolar calyx. Clear urine returned through the needle. An image was obtained and stored for the medical record. The 0.018 inch wire was advanced into the renal collecting system. The Accustick sheath was then advanced over the wire and positioned within the renal pelvis. Position was confirmed with a gentle hand injection of contrast material. A Rosen wire was advanced into the renal pelvis. The tract was then subsequently dilated to 10 [place] and a [place] all-purpose drain advanced over the wire and positioned with the locking loop in the renal pelvis. A final image was obtained and stored. The tube was secured to the skin with 0 Prolene suture and a plastic bumper. The tube was placed to gravity drainage. The patient tolerated the procedure well.  Attention was next turned to the  right flank. The right flank was interrogated with ultrasound. The hydronephrotic right kidney was successfully identified. An appropriate skin entry site overlying a posterior caval ex was identified and marked. Local anesthesia was attained by infiltration with 1% lidocaine. A small dermatotomy was made. Under real-time sonographic guidance, a 21 gauge Accustick needle was used to puncture a posterior lower pole calyx. Clear urine returned through the needle. The 0.018 inch wire was advanced into the renal pelvis. The Accustick needle was exchanged for the Accustick sheath. Position within the renal collecting system was confirmed by gentle hand injection of contrast material. A J-wire was advanced through the Accustick sheath and coiled within the pelvis. The Accustick sheath was removed. The skin tract was dilated to 10 [place] and a [place] all-purpose drainage catheter was advanced over the wire and positioned with the locking loop in the renal pelvis. The tube was secured to the skin with 0 Prolene suture and connected to gravity bag drainage. The patient tolerated the procedure well  without evidence of complication.  ANESTHESIA/SEDATION: Moderate (conscious) sedation was used. 0.5 mg Versed, 25 mcg Fentanyl were administered intravenously. The patient's vital signs were monitored continuously by radiology nursing throughout the procedure. 25 mg of Benadryl was also administered intravenously an addition to 2 g Ancef within 1 hr of skin incision.  Sedation Time: 13 minutes  CONTRAST:  10 cc Omnipaque 300  FLUOROSCOPY TIME:  54 seconds  PROCEDURE: 1. Ultrasound-guided puncture of the left renal collecting system 2. Placement of a 10 French percutaneous nephrostomy tube under fluoroscopic guidance 3. Ultrasound guided puncture of the right renal collecting system 4. Placement of a 10 French percutaneous nephrostomy tube under fluoroscopic guidance Interventional Radiologist:  Criselda Peaches, MD  IMPRESSION: Successful placement of bilateral 10 French percutaneous nephrostomy tubes.  Return to interventional radiology in 4 weeks for routine bilateral tube check and change. At that time, an attempt could be made to internalize to double-J ureteral stents, or nephro ureteral stents.  Signed,  Criselda Peaches, MD  [facility]  [facility]   Electronically Signed   By: Jacqulynn Cadet M.D.   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 She is currently admitted with acute on chronic renal failure which is thought to be exacerbated by the underlying obstructed uropathy. Bilateral percutaneous nephrostomy tubes are warranted for decompression in an effort to salvage renal function. She was anticoagulated on Coumadin at her initial presentation. She has so far received 3 units of FFP and her INR is currently 1.6. A 4th unit of FFP is currently running and will be continued throughout the procedure.  EXAM: IR ULTRASOUND GUIDANCE TISSUE ABLATION; PERC  NEPHROSTOMY*L*; PERC NEPHROSTOMY*R*  Date: 03/24/2013  TECHNIQUE: Informed consent was obtained from the patient following explanation of the procedure, risks, benefits and alternatives. The patient understands, agrees and consents for the procedure. All questions were addressed. A time out was performed.  Maximal barrier sterile technique utilized including caps, mask, sterile gowns, sterile gloves, large sterile drape, hand hygiene, and Betadine skin prep.  The left flank was interrogated with ultrasound. The hydronephrotic left kidney was successfully identified. An appropriate skin entry site overlying a posterior calyx was identified and marked. Local anesthesia was attained by infiltration with 1% lidocaine. A small dermatotomy was made. Under real-time sonographic guidance, a 21 gauge Accustick needle was used to puncture a posterior interpolar  calyx. Clear urine returned through the needle. An image was obtained and stored for the medical record. The 0.018 inch wire was advanced into the renal collecting system. The Accustick sheath was then advanced over the wire and positioned within the renal pelvis. Position was confirmed with a gentle hand injection of contrast material. A Rosen wire was advanced into the renal pelvis. The tract was then subsequently dilated to 10 [place] and a [place] all-purpose drain advanced over the wire and positioned with the locking loop in the renal pelvis. A final image was obtained and stored. The tube was secured to the skin with 0 Prolene suture and a plastic bumper. The tube was placed to gravity drainage. The patient tolerated the procedure well.  Attention was next turned to the right flank. The right flank was interrogated with ultrasound. The hydronephrotic right kidney was successfully identified. An appropriate skin entry site overlying a posterior caval ex was identified and marked. Local anesthesia was attained by infiltration with 1% lidocaine. A small  dermatotomy was made. Under real-time sonographic guidance, a 21 gauge Accustick needle was used to puncture a posterior lower pole calyx. Clear urine returned through the needle. The 0.018 inch wire was advanced into the renal pelvis. The Accustick needle was exchanged for the Accustick sheath. Position within the renal collecting system was confirmed by gentle hand injection of contrast material. A J-wire was advanced through the Accustick sheath and coiled within the pelvis. The Accustick sheath was removed. The skin tract was dilated to 10 [place] and a [place] all-purpose drainage catheter was advanced over the wire and positioned with the locking loop in the renal pelvis. The tube was secured to the skin with 0 Prolene suture and connected to gravity bag drainage. The patient tolerated the procedure well without evidence of complication.  ANESTHESIA/SEDATION: Moderate (conscious) sedation was used. 0.5 mg Versed, 25 mcg Fentanyl were administered intravenously. The patient's vital signs were monitored continuously by radiology nursing throughout the procedure. 25 mg of Benadryl was also administered intravenously an addition to 2 g Ancef within 1 hr of skin incision.  Sedation Time: 13 minutes  CONTRAST:  10 cc Omnipaque 300  FLUOROSCOPY TIME:  54 seconds  PROCEDURE: 1. Ultrasound-guided puncture of the left renal collecting system 2. Placement of a 10 French percutaneous nephrostomy tube under fluoroscopic guidance 3. Ultrasound guided puncture of the right renal collecting system 4. Placement of a 10 French percutaneous nephrostomy tube under fluoroscopic guidance Interventional Radiologist:  Criselda Peaches, MD  IMPRESSION: Successful placement of bilateral 10 French percutaneous nephrostomy tubes.  Return to interventional radiology in 4 weeks for routine bilateral tube check and change. At that time, an attempt could be made to internalize to double-J ureteral stents, or nephro ureteral stents.   Signed,  Criselda Peaches, MD  [facility]  [facility]iology   Electronically Signed   By: Jacqulynn Cadet M.D.   On: 03/24/2013 17:26   Urine culture from 03/22/13: > 100K ecoli, resistant to cipro and ampicillin.  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 She has associated acute renal insufficiency. She is scheduled for bilateral percutaneous nephrostomy tubes today.  Would hold anticoagulation given degree of hematuria low hemoglobin.   She is scheduled for TURBT on Monday, midday.  She should be NPO Sunday p MN.  May be best to hold anticoagulation now until after her procedure.  Would recommend starting Macrobid for UTI today.   LOS:  3 days   Ardis Hughs 03/25/2013, 11:52 AM

## 2013-03-25 NOTE — Progress Notes (Signed)
[place] for Heparin Indication: History of DVT/PE  Allergies  Allergen Reactions  . Motrin [Ibuprofen] Rash  . Sulfa Drugs Cross Reactors Rash  . Iohexol      Code: HIVES, Desc: pt states she has dye allergy, Onset Date: [id number]   . Quinine Derivatives Rash    Patient Measurements: Height: 5\' 1"  (154.9 cm) Weight: 158 lb 3.2 oz (71.759 kg) IBW/kg (Calculated) : 47.8   Vital Signs: Temp: 98.7 F (37.1 C) (03/07 0647) Temp src: Oral (03/07 0647) BP: 125/71 mmHg (03/07 0647) Pulse Rate: 72 (03/07 0647)  Labs:  Recent Labs  03/23/13 0530 03/24/13 0445 03/24/13 1407 03/25/13 0439  HGB 7.6* 7.5*  --  7.1*  HCT 25.9* 24.2*  --  23.7*  PLT 419* 401*  --  385  APTT 47*  --   --   --   LABPROT 25.6* 21.8* 18.7*  --   INR 2.43* 1.97* 1.61*  --   HEPARINUNFRC  --   --   --  <0.10*  CREATININE 3.79*  --  4.49* 4.03*    Estimated Creatinine Clearance: 11.1 ml/min (by C-G formula based on Cr of 4.03).  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  Patient requiring IR guided bilateral stenting with eventual TURBT (planned for 3/9) so warfarin held, 4 units FFP given, and IV heparin infusion started for bridge.  Underwent nephrostomy tube placement 3/6 and resumed heparin 4 hours later.   Heparin level undetectable. Pharmacy confirmed with RN that heparin was running at 900 units/hr with no interruptions or apparent problems.  Hgb remains low.  No bleeding reported/documented.  SCr slightly better.   Goal of Therapy :  Heparin level 0.3-0.7 units/ml Monitor platelets by anticoagulation protocol: Yes   Plan:   Increase heparin to 1150 units/hr. Conservative based on our protocol.  Recheck heparin level in 8 hours.  Daily heparin level and CBC.  Cont to monitor for worsening/recurrence of hematuria.  Romeo Rabon,  PharmD, pager [phone]. 03/25/2013,7:07 AM.

## 2013-03-25 NOTE — Progress Notes (Signed)
Subjective: Patient states she is feeling better today, less back pain. She denies any lightheadedness or dizziness.   Objective: Physical Exam: BP 125/71  Pulse 72  Temp(Src) 98.7 F (37.1 C) (Oral)  Resp 18  Ht 5\' 1"  (1.549 m)  Wt 158 lb 3.2 oz (71.759 kg)  BMI 29.91 kg/m2  SpO2 99%  General: A&Ox3, NAD Abd: Soft, NT, ND Right PCN dressing C/D/I, ouput bloody 500cc (24 hr) Left PCN dressing C/D/I, output blood tinged yellow urine 400cc (24hr)  Labs: CBC  Recent Labs  03/24/13 0445 03/25/13 0439  WBC 8.9 8.9  HGB 7.5* 7.1*  HCT 24.2* 23.7*  PLT 401* 385   BMET  Recent Labs  03/24/13 1407 03/25/13 0439  NA 136* 137  K 5.4* 4.8  CL 101 103  CO2 22 22  GLUCOSE 89 150*  BUN 54* 52*  CREATININE 4.49* 4.03*  CALCIUM 9.0 8.4   LFT  Recent Labs  03/22/13 1803 03/23/13 0530  PROT 8.5* 6.5  ALBUMIN 3.0* 2.2*  AST 23 17  ALT 10 7  ALKPHOS 244* 185*  BILITOT <0.2* <0.2*  BILIDIR <0.2  --   IBILI NOT CALCULATED  --    PT/INR  Recent Labs  03/24/13 0445 03/24/13 1407  LABPROT 21.8* 18.7*  INR 1.97* 1.61*     Studies/Results: Ir Perc Nephrostomy Left  03/24/2013   CLINICAL DATA:  73 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 She is currently admitted with acute on chronic renal failure which is thought to be exacerbated by the underlying obstructed uropathy. Bilateral percutaneous nephrostomy tubes are warranted for decompression in an effort to salvage renal function. She was anticoagulated on Coumadin at her initial presentation. She has so far received 3 units of FFP and her INR is currently 1.6. A 4th unit of FFP is currently running and will be continued throughout the procedure.  EXAM: IR ULTRASOUND GUIDANCE TISSUE ABLATION; PERC NEPHROSTOMY*L*; PERC NEPHROSTOMY*R*  Date: 03/24/2013  TECHNIQUE: Informed consent was obtained from the patient following explanation of the procedure, risks, benefits and alternatives. The patient understands, agrees  and consents for the procedure. All questions were addressed. A time out was performed.  Maximal barrier sterile technique utilized including caps, mask, sterile gowns, sterile gloves, large sterile drape, hand hygiene, and Betadine skin prep.  The left flank was interrogated with ultrasound. The hydronephrotic left kidney was successfully identified. An appropriate skin entry site overlying a posterior calyx was identified and marked. Local anesthesia was attained by infiltration with 1% lidocaine. A small dermatotomy was made. Under real-time sonographic guidance, a 21 gauge Accustick needle was used to puncture a posterior interpolar calyx. Clear urine returned through the needle. An image was obtained and stored for the medical record. The 0.018 inch wire was advanced into the renal collecting system. The Accustick sheath was then advanced over the wire and positioned within the renal pelvis. Position was confirmed with a gentle hand injection of contrast material. A Rosen wire was advanced into the renal pelvis. The tract was then subsequently dilated to 10 [place] and a [place] all-purpose drain advanced over the wire and positioned with the locking loop in the renal pelvis. A final image was obtained and stored. The tube was secured to the skin with 0 Prolene suture and a plastic bumper. The tube was placed to gravity drainage. The patient tolerated the procedure well.  Attention was next turned to the right flank. The right flank was interrogated with ultrasound. The hydronephrotic right kidney was  successfully identified. An appropriate skin entry site overlying a posterior caval ex was identified and marked. Local anesthesia was attained by infiltration with 1% lidocaine. A small dermatotomy was made. Under real-time sonographic guidance, a 21 gauge Accustick needle was used to puncture a posterior lower pole calyx. Clear urine returned through the needle. The 0.018 inch wire was advanced into the  renal pelvis. The Accustick needle was exchanged for the Accustick sheath. Position within the renal collecting system was confirmed by gentle hand injection of contrast material. A J-wire was advanced through the Accustick sheath and coiled within the pelvis. The Accustick sheath was removed. The skin tract was dilated to 10 [place] and a [place] all-purpose drainage catheter was advanced over the wire and positioned with the locking loop in the renal pelvis. The tube was secured to the skin with 0 Prolene suture and connected to gravity bag drainage. The patient tolerated the procedure well without evidence of complication.  ANESTHESIA/SEDATION: Moderate (conscious) sedation was used. 0.5 mg Versed, 25 mcg Fentanyl were administered intravenously. The patient's vital signs were monitored continuously by radiology nursing throughout the procedure. 25 mg of Benadryl was also administered intravenously an addition to 2 g Ancef within 1 hr of skin incision.  Sedation Time: 13 minutes  CONTRAST:  10 cc Omnipaque 300  FLUOROSCOPY TIME:  54 seconds  PROCEDURE: 1. Ultrasound-guided puncture of the left renal collecting system 2. Placement of a 10 French percutaneous nephrostomy tube under fluoroscopic guidance 3. Ultrasound guided puncture of the right renal collecting system 4. Placement of a 10 French percutaneous nephrostomy tube under fluoroscopic guidance Interventional Radiologist:  Criselda Peaches, MD  IMPRESSION: Successful placement of bilateral 10 French percutaneous nephrostomy tubes.  Return to interventional radiology in 4 weeks for routine bilateral tube check and change. At that time, an attempt could be made to internalize to double-J ureteral stents, or nephro ureteral stents.  Signed,  Criselda Peaches, MD  [facility]  [facility]   Electronically Signed   By: Jacqulynn Cadet M.D.   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 She is currently admitted with acute on chronic renal failure which is thought to be exacerbated by the underlying obstructed uropathy. Bilateral percutaneous nephrostomy tubes are warranted for decompression in an effort to salvage renal function. She was anticoagulated on Coumadin at her initial presentation. She has so far received 3 units of FFP and her INR is currently 1.6. A 4th unit of FFP is currently running and will be continued throughout the procedure.  EXAM: IR ULTRASOUND GUIDANCE TISSUE ABLATION; PERC NEPHROSTOMY*L*; PERC NEPHROSTOMY*R*  Date: 03/24/2013  TECHNIQUE: Informed consent was obtained from the patient following explanation of the procedure, risks, benefits and alternatives. The patient understands, agrees and consents for the procedure. All questions were addressed. A time out was performed.  Maximal barrier sterile technique utilized including caps, mask, sterile gowns, sterile gloves, large sterile drape, hand hygiene, and Betadine skin prep.  The left flank was interrogated with ultrasound. The hydronephrotic left kidney was successfully identified. An appropriate skin entry site overlying a posterior calyx was identified and marked. Local anesthesia was attained by infiltration with 1% lidocaine. A small dermatotomy was made. Under real-time sonographic guidance, a 21 gauge Accustick needle was used to puncture a posterior interpolar calyx. Clear urine returned through the needle. An image was obtained and stored for the  medical record. The 0.018 inch wire was advanced into the renal collecting system. The Accustick sheath was then advanced over the wire and positioned within the renal pelvis. Position was confirmed with a gentle hand injection of contrast material. A Rosen wire was advanced into the renal pelvis. The tract was then subsequently dilated to 10 [place] and a [place] all-purpose drain advanced over  the wire and positioned with the locking loop in the renal pelvis. A final image was obtained and stored. The tube was secured to the skin with 0 Prolene suture and a plastic bumper. The tube was placed to gravity drainage. The patient tolerated the procedure well.  Attention was next turned to the right flank. The right flank was interrogated with ultrasound. The hydronephrotic right kidney was successfully identified. An appropriate skin entry site overlying a posterior caval ex was identified and marked. Local anesthesia was attained by infiltration with 1% lidocaine. A small dermatotomy was made. Under real-time sonographic guidance, a 21 gauge Accustick needle was used to puncture a posterior lower pole calyx. Clear urine returned through the needle. The 0.018 inch wire was advanced into the renal pelvis. The Accustick needle was exchanged for the Accustick sheath. Position within the renal collecting system was confirmed by gentle hand injection of contrast material. A J-wire was advanced through the Accustick sheath and coiled within the pelvis. The Accustick sheath was removed. The skin tract was dilated to 10 [place] and a [place] all-purpose drainage catheter was advanced over the wire and positioned with the locking loop in the renal pelvis. The tube was secured to the skin with 0 Prolene suture and connected to gravity bag drainage. The patient tolerated the procedure well without evidence of complication.  ANESTHESIA/SEDATION: Moderate (conscious) sedation was used. 0.5 mg Versed, 25 mcg Fentanyl were administered intravenously. The patient's vital signs were monitored continuously by radiology nursing throughout the procedure. 25 mg of Benadryl was also administered intravenously an addition to 2 g Ancef within 1 hr of skin incision.  Sedation Time: 13 minutes  CONTRAST:  10 cc Omnipaque 300  FLUOROSCOPY TIME:  54 seconds  PROCEDURE: 1. Ultrasound-guided puncture of the left renal collecting  system 2. Placement of a 10 French percutaneous nephrostomy tube under fluoroscopic guidance 3. Ultrasound guided puncture of the right renal collecting system 4. Placement of a 10 French percutaneous nephrostomy tube under fluoroscopic guidance Interventional Radiologist:  Criselda Peaches, MD  IMPRESSION: Successful placement of bilateral 10 French percutaneous nephrostomy tubes.  Return to interventional radiology in 4 weeks for routine bilateral tube check and change. At that time, an attempt could be made to internalize to double-J ureteral stents, or nephro ureteral stents.  Signed,  Criselda Peaches, MD  [facility]  [facility]   Electronically Signed   By: Jacqulynn Cadet M.D.   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 She is currently admitted with acute on chronic renal failure which is thought to be exacerbated by the underlying obstructed uropathy. Bilateral percutaneous nephrostomy tubes are warranted for decompression in an effort to salvage renal function. She was anticoagulated on Coumadin at her initial presentation. She has so far received 3 units of FFP and her INR is currently 1.6. A 4th unit of FFP is currently running and will be continued throughout the procedure.  EXAM: IR ULTRASOUND GUIDANCE TISSUE ABLATION; PERC NEPHROSTOMY*L*; PERC  NEPHROSTOMY*R*  Date: 03/24/2013  TECHNIQUE: Informed consent was obtained from the patient following explanation of the procedure, risks, benefits and alternatives. The patient understands, agrees and consents for the procedure. All questions were addressed. A time out was performed.  Maximal barrier sterile technique utilized including caps, mask, sterile gowns, sterile gloves, large sterile drape, hand hygiene, and Betadine skin prep.  The left flank was interrogated with ultrasound. The hydronephrotic left kidney  was successfully identified. An appropriate skin entry site overlying a posterior calyx was identified and marked. Local anesthesia was attained by infiltration with 1% lidocaine. A small dermatotomy was made. Under real-time sonographic guidance, a 21 gauge Accustick needle was used to puncture a posterior interpolar calyx. Clear urine returned through the needle. An image was obtained and stored for the medical record. The 0.018 inch wire was advanced into the renal collecting system. The Accustick sheath was then advanced over the wire and positioned within the renal pelvis. Position was confirmed with a gentle hand injection of contrast material. A Rosen wire was advanced into the renal pelvis. The tract was then subsequently dilated to 10 [place] and a [place] all-purpose drain advanced over the wire and positioned with the locking loop in the renal pelvis. A final image was obtained and stored. The tube was secured to the skin with 0 Prolene suture and a plastic bumper. The tube was placed to gravity drainage. The patient tolerated the procedure well.  Attention was next turned to the right flank. The right flank was interrogated with ultrasound. The hydronephrotic right kidney was successfully identified. An appropriate skin entry site overlying a posterior caval ex was identified and marked. Local anesthesia was attained by infiltration with 1% lidocaine. A small dermatotomy was made. Under real-time sonographic guidance, a 21 gauge Accustick needle was used to puncture a posterior lower pole calyx. Clear urine returned through the needle. The 0.018 inch wire was advanced into the renal pelvis. The Accustick needle was exchanged for the Accustick sheath. Position within the renal collecting system was confirmed by gentle hand injection of contrast material. A J-wire was advanced through the Accustick sheath and coiled within the pelvis. The Accustick sheath was removed. The skin tract was dilated to 10  [place] and a [place] all-purpose drainage catheter was advanced over the wire and positioned with the locking loop in the renal pelvis. The tube was secured to the skin with 0 Prolene suture and connected to gravity bag drainage. The patient tolerated the procedure well without evidence of complication.  ANESTHESIA/SEDATION: Moderate (conscious) sedation was used. 0.5 mg Versed, 25 mcg Fentanyl were administered intravenously. The patient's vital signs were monitored continuously by radiology nursing throughout the procedure. 25 mg of Benadryl was also administered intravenously an addition to 2 g Ancef within 1 hr of skin incision.  Sedation Time: 13 minutes  CONTRAST:  10 cc Omnipaque 300  FLUOROSCOPY TIME:  54 seconds  PROCEDURE: 1. Ultrasound-guided puncture of the left renal collecting system 2. Placement of a 10 French percutaneous nephrostomy tube under fluoroscopic guidance 3. Ultrasound guided puncture of the right renal collecting system 4. Placement of a 10 French percutaneous nephrostomy tube under fluoroscopic guidance Interventional Radiologist:  Criselda Peaches, MD  IMPRESSION: Successful placement of bilateral 10 French percutaneous nephrostomy tubes.  Return to interventional radiology in 4 weeks for routine bilateral tube check and change. At that time, an attempt could be made to internalize to double-J ureteral stents, or nephro ureteral stents.  Signed,  Criselda Peaches, MD  [facility]  [facility]   Electronically Signed   By: Jacqulynn Cadet M.D.   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 She is currently admitted with acute on chronic renal failure which is thought to be exacerbated by the underlying obstructed uropathy. Bilateral percutaneous nephrostomy tubes are warranted for decompression in an effort to salvage renal function. She  was anticoagulated on Coumadin at her initial presentation. She has so far received 3 units of FFP and her INR is currently 1.6. A 4th unit of FFP is currently running and will be continued throughout the procedure.  EXAM: IR ULTRASOUND GUIDANCE TISSUE ABLATION; PERC NEPHROSTOMY*L*; PERC NEPHROSTOMY*R*  Date: 03/24/2013  TECHNIQUE: Informed consent was obtained from the patient following explanation of the procedure, risks, benefits and alternatives. The patient understands, agrees and consents for the procedure. All questions were addressed. A time out was performed.  Maximal barrier sterile technique utilized including caps, mask, sterile gowns, sterile gloves, large sterile drape, hand hygiene, and Betadine skin prep.  The left flank was interrogated with ultrasound. The hydronephrotic left kidney was successfully identified. An appropriate skin entry site overlying a posterior calyx was identified and marked. Local anesthesia was attained by infiltration with 1% lidocaine. A small dermatotomy was made. Under real-time sonographic guidance, a 21 gauge Accustick needle was used to puncture a posterior interpolar calyx. Clear urine returned through the needle. An image was obtained and stored for the medical record. The 0.018 inch wire was advanced into the renal collecting system. The Accustick sheath was then advanced over the wire and positioned within the renal pelvis. Position was confirmed with a gentle hand injection of contrast material. A Rosen wire was advanced into the renal pelvis. The tract was then subsequently dilated to 10 [place] and a [place] all-purpose drain advanced over the wire and positioned with the locking loop in the renal pelvis. A final image was obtained and stored. The tube was secured to the skin with 0 Prolene suture and a plastic bumper. The tube was placed to gravity drainage. The patient tolerated the procedure well.  Attention was next turned to the right flank. The right  flank was interrogated with ultrasound. The hydronephrotic right kidney was successfully identified. An appropriate skin entry site overlying a posterior caval ex was identified and marked. Local anesthesia was attained by infiltration with 1% lidocaine. A small dermatotomy was made. Under real-time sonographic guidance, a 21 gauge Accustick needle was used to puncture a posterior lower pole calyx. Clear urine returned through the needle. The 0.018 inch wire was advanced into the renal pelvis. The Accustick needle was exchanged for the Accustick sheath. Position within the renal collecting system was confirmed by gentle hand injection of contrast material. A J-wire was advanced through the Accustick sheath and coiled within the pelvis. The Accustick sheath was removed. The skin tract was dilated to 10 [place] and a [place] all-purpose drainage catheter was advanced over the wire and positioned with the locking loop in the renal pelvis. The tube was secured to the skin with 0 Prolene suture and connected to gravity bag drainage. The patient tolerated the procedure well without evidence of complication.  ANESTHESIA/SEDATION: Moderate (conscious) sedation was used. 0.5 mg Versed, 25 mcg Fentanyl were administered intravenously. The patient's vital signs were monitored continuously by radiology nursing throughout the procedure. 25 mg  of Benadryl was also administered intravenously an addition to 2 g Ancef within 1 hr of skin incision.  Sedation Time: 13 minutes  CONTRAST:  10 cc Omnipaque 300  FLUOROSCOPY TIME:  54 seconds  PROCEDURE: 1. Ultrasound-guided puncture of the left renal collecting system 2. Placement of a 10 French percutaneous nephrostomy tube under fluoroscopic guidance 3. Ultrasound guided puncture of the right renal collecting system 4. Placement of a 10 French percutaneous nephrostomy tube under fluoroscopic guidance Interventional Radiologist:  Criselda Peaches, MD  IMPRESSION: Successful  placement of bilateral 10 French percutaneous nephrostomy tubes.  Return to interventional radiology in 4 weeks for routine bilateral tube check and change. At that time, an attempt could be made to internalize to double-J ureteral stents, or nephro ureteral stents.  Signed,  Criselda Peaches, MD  [facility]  [facility]   Electronically Signed   By: Jacqulynn Cadet M.D.   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  Cr 4.03 (4.49), wbc WNL, afebrile.  Plans per Urology, possible cystoscopy, TURBT, bilateral retrogrades and possible ureteroscopy on Monday. IR will follow.    LOS: 3 days    Rockney Ghee 03/25/2013 8:58 AM

## 2013-03-25 NOTE — Progress Notes (Signed)
Pt's CBG checked on 0252 was 67; she was given 8 oz orange juice to treat her hypoglycemia and rechecked again at 0312, her blood sugar went down further to 66. A snack (peanut butter, graham crackers and 8 oz orange juice) was given to her at 0320; her CBG was rechecked again at 0337, of which it went back to 105. Will continue to monitor.

## 2013-03-25 NOTE — Progress Notes (Signed)
ANTICOAGULATION CONSULT NOTE - Follow Up  Heparin level via foot stick < 0.1 again on 1150 units/hr  Bleeding from right nephrostomy unchanged per RN, output a little darker from left nephrostomy per RN  Plan:  Increase heparin to 1400 units/hr (no bolus)  Recheck heparin level in 8hrs  Peggyann Juba, PharmD, BCPS Pager: [phone] 03/25/2013 6:22 PM

## 2013-03-26 DIAGNOSIS — D62 Acute posthemorrhagic anemia: Secondary | ICD-10-CM

## 2013-03-26 LAB — BASIC METABOLIC PANEL
BUN: 41 mg/dL — ABNORMAL HIGH (ref 6–23)
CALCIUM: 8.4 mg/dL (ref 8.4–10.5)
CO2: 20 meq/L (ref 19–32)
CREATININE: 3.15 mg/dL — AB (ref 0.50–1.10)
Chloride: 105 mEq/L (ref 96–112)
GFR calc Af Amer: 16 mL/min — ABNORMAL LOW (ref >90)
GFR, EST NON AFRICAN AMERICAN: 14 mL/min — AB (ref >90)
GLUCOSE: 126 mg/dL — AB (ref 70–99)
Potassium: 4.1 mEq/L (ref 3.7–5.3)
Sodium: 138 mEq/L (ref 137–147)

## 2013-03-26 LAB — GLUCOSE, CAPILLARY
GLUCOSE-CAPILLARY: 97 mg/dL (ref 70–99)
GLUCOSE-CAPILLARY: 98 mg/dL (ref 70–99)
GLUCOSE-CAPILLARY: 167 mg/dL — AB (ref 70–99)
Glucose-Capillary: 115 mg/dL — ABNORMAL HIGH (ref 70–99)
Glucose-Capillary: 139 mg/dL — ABNORMAL HIGH (ref 70–99)
Glucose-Capillary: 180 mg/dL — ABNORMAL HIGH (ref 70–99)

## 2013-03-26 LAB — CBC
HCT: 21.7 % — ABNORMAL LOW (ref 36.0–46.0)
HEMOGLOBIN: 6.7 g/dL — AB (ref 12.0–15.0)
MCH: 26.4 pg (ref 26.0–34.0)
MCHC: 30.9 g/dL (ref 30.0–36.0)
MCV: 85.4 fL (ref 78.0–100.0)
Platelets: 381 10*3/uL (ref 150–400)
RBC: 2.54 MIL/uL — ABNORMAL LOW (ref 3.87–5.11)
RDW: 16.3 % — ABNORMAL HIGH (ref 11.5–15.5)
WBC: 8.6 10*3/uL (ref 4.0–10.5)

## 2013-03-26 LAB — HEPARIN LEVEL (UNFRACTIONATED)
HEPARIN UNFRACTIONATED: 0.41 [IU]/mL (ref 0.30–0.70)
Heparin Unfractionated: 0.48 IU/mL (ref 0.30–0.70)
Heparin Unfractionated: 0.2 IU/mL — ABNORMAL LOW (ref 0.30–0.70)

## 2013-03-26 LAB — HEMOGLOBIN AND HEMATOCRIT, BLOOD
HCT: 26.8 % — ABNORMAL LOW (ref 36.0–46.0)
HEMATOCRIT: 26.5 % — AB (ref 36.0–46.0)
HEMATOCRIT: 26.7 % — AB (ref 36.0–46.0)
HEMOGLOBIN: 8.5 g/dL — AB (ref 12.0–15.0)
HEMOGLOBIN: 8.4 g/dL — AB (ref 12.0–15.0)
Hemoglobin: 8.7 g/dL — ABNORMAL LOW (ref 12.0–15.0)

## 2013-03-26 LAB — PREPARE RBC (CROSSMATCH)

## 2013-03-26 MED ORDER — CEFTRIAXONE SODIUM 1 G IJ SOLR
1.0000 g | INTRAMUSCULAR | Status: DC
  Administered 2013-03-28: 1 g via INTRAVENOUS
  Filled 2013-03-26 (×2): qty 10

## 2013-03-26 MED ORDER — HEPARIN (PORCINE) IN NACL 100-0.45 UNIT/ML-% IJ SOLN
1600.0000 [IU]/h | INTRAMUSCULAR | Status: AC
  Administered 2013-03-26: 1600 [IU]/h via INTRAVENOUS

## 2013-03-26 MED ORDER — DEXTROSE 5 % IV SOLN
1.0000 g | INTRAVENOUS | Status: DC
  Filled 2013-03-26: qty 10

## 2013-03-26 NOTE — Progress Notes (Signed)
ANTICOAGULATION CONSULT NOTE - Follow Up Consult  Pharmacy Consult for Heparin Indication: pulmonary embolus and DVT  Allergies  Allergen Reactions  . Motrin [Ibuprofen] Rash  . Sulfa Drugs Cross Reactors Rash  . Iohexol      Code: HIVES, Desc: pt states she has dye allergy, Onset Date: [id number]   . Quinine Derivatives Rash    Patient Measurements: Height: 5\' 1"  (154.9 cm) Weight: 158 lb 3.2 oz (71.759 kg) IBW/kg (Calculated) : 47.8 Heparin Dosing Weight:   Vital Signs: Temp: 99.6 F (37.6 C) (03/07 2027) Temp src: Oral (03/07 2027) BP: 133/50 mmHg (03/07 2027) Pulse Rate: 82 (03/07 2027)  Labs:  Recent Labs  03/23/13 0530 03/24/13 0445 03/24/13 1407 03/25/13 0439 03/25/13 1633 03/26/13 0321  HGB 7.6* 7.5*  --  7.1*  --  6.7*  HCT 25.9* 24.2*  --  23.7*  --  21.7*  PLT 419* 401*  --  385  --  381  APTT 47*  --   --   --   --   --   LABPROT 25.6* 21.8* 18.7*  --   --   --   INR 2.43* 1.97* 1.61*  --   --   --   HEPARINUNFRC  --   --   --  <0.10* <0.10* 0.48  CREATININE 3.79*  --  4.49* 4.03*  --  3.15*    Estimated Creatinine Clearance: 14.2 ml/min (by C-G formula based on Cr of 3.15).   Medications:  Infusions:  . sodium chloride 75 mL/hr at 03/26/13 0320  . heparin 1,400 Units/hr (03/25/13 2057)    Assessment: Patient with heparin level at goal.  Per RN the amount of hematuria unchanged.  Hgb still and slightly lower to 6.7.  Note NP made aware.  Goal of Therapy:  Heparin level 0.3-0.7 units/ml Monitor platelets by anticoagulation protocol: Yes   Plan:  Continue heparin at current rate. Recheck level with 1000 am level  Tyler Deis, Shea Stakes Crowford 03/26/2013,4:49 AM

## 2013-03-26 NOTE — Progress Notes (Signed)
Subjective: Remains on heparin gtt, right neph tube less bloody today than yesterday Transfused 1 unit of PRBC overnight, f/u hgb 6.7 No acute events overnight.  Objective: Vital signs in last 24 hours: Temp:  [98.5 F (36.9 C)-99.6 F (37.6 C)] 98.7 F (37.1 C) (03/08 0915) Pulse Rate:  [68-86] 73 (03/08 0915) Resp:  [16-20] 16 (03/08 0915) BP: (98-199)/(40-87) 134/51 mmHg (03/08 0915) SpO2:  [96 %-100 %] 100 % (03/08 0915) Weight:  [72.6 kg (160 lb 0.9 oz)] 72.6 kg (160 lb 0.9 oz) (03/08 0557)  Intake/Output from previous day: 03/07 0701 - 03/08 0700 In: 946 [I.V.:933.5; Blood:12.5] Out: 1835 [AYTKZ:6010] Intake/Output this shift: Total I/O In: 331.3 [Blood:331.3] Out: -   Physical Exam:  Constitutional: Vital signs reviewed. WD WN in NAD   Eyes: PERRL, No scleral icterus.   Abdominal: Soft. Non-tender, non-distended, bowel sounds are normal, no masses, organomegaly, or guarding present. Neph tube sites are c/d/i.  Right neph tube is draining bloody urine, left neph tube is draining clear urine. Genitourinary: Extremities: clubbing of finger nails  Lab Results:  Recent Labs  03/24/13 0445 03/25/13 0439 03/26/13 0321  HGB 7.5* 7.1* 6.7*  HCT 24.2* 23.7* 21.7*   BMET  Recent Labs  03/25/13 0439 03/26/13 0321  NA 137 138  K 4.8 4.1  CL 103 105  CO2 22 20  GLUCOSE 150* 126*  BUN 52* 41*  CREATININE 4.03* 3.15*  CALCIUM 8.4 8.4    Recent Labs  03/24/13 0445 03/24/13 1407  INR 1.97* 1.61*   No results found for this basename: LABURIN,  in the last 72 hours Results for orders placed during the hospital encounter of 03/22/13  URINE CULTURE     Status: None   Collection Time    03/22/13  5:02 PM      Result Value Ref Range Status   Specimen Description URINE, CLEAN CATCH   Final   Special Requests NONE   Final   Culture  Setup Time     Final   Value: 03/23/2013 05:15     Performed at [facility]     Final   Value:  >=100,000 COLONIES/ML     Performed at [facility]   Culture     Final   Value: ESCHERICHIA COLI     Performed at [facility]   Report Status 03/25/2013 FINAL   Final   Organism ID, Bacteria ESCHERICHIA COLI   Final  MRSA PCR SCREENING     Status: None   Collection Time    03/24/13  2:05 PM      Result Value Ref Range Status   MRSA by PCR NEGATIVE  NEGATIVE Final   Comment:            The GeneXpert MRSA Assay (FDA     approved for NASAL specimens     only), is one component of a     comprehensive MRSA colonization     surveillance program. It is not     intended to diagnose MRSA     infection nor to guide or     monitor treatment for     MRSA infections.    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 She is currently admitted with acute on chronic renal failure which is thought to be exacerbated by the underlying obstructed uropathy. Bilateral percutaneous nephrostomy tubes are warranted for decompression in an  effort to salvage renal function. She was anticoagulated on Coumadin at her initial presentation. She has so far received 3 units of FFP and her INR is currently 1.6. A 4th unit of FFP is currently running and will be continued throughout the procedure.  EXAM: IR ULTRASOUND GUIDANCE TISSUE ABLATION; PERC NEPHROSTOMY*L*; PERC NEPHROSTOMY*R*  Date: 03/24/2013  TECHNIQUE: Informed consent was obtained from the patient following explanation of the procedure, risks, benefits and alternatives. The patient understands, agrees and consents for the procedure. All questions were addressed. A time out was performed.  Maximal barrier sterile technique utilized including caps, mask, sterile gowns, sterile gloves, large sterile drape, hand hygiene, and Betadine skin prep.  The left flank was interrogated with ultrasound. The hydronephrotic left kidney was successfully identified. An appropriate skin  entry site overlying a posterior calyx was identified and marked. Local anesthesia was attained by infiltration with 1% lidocaine. A small dermatotomy was made. Under real-time sonographic guidance, a 21 gauge Accustick needle was used to puncture a posterior interpolar calyx. Clear urine returned through the needle. An image was obtained and stored for the medical record. The 0.018 inch wire was advanced into the renal collecting system. The Accustick sheath was then advanced over the wire and positioned within the renal pelvis. Position was confirmed with a gentle hand injection of contrast material. A Rosen wire was advanced into the renal pelvis. The tract was then subsequently dilated to 10 [place] and a [place] all-purpose drain advanced over the wire and positioned with the locking loop in the renal pelvis. A final image was obtained and stored. The tube was secured to the skin with 0 Prolene suture and a plastic bumper. The tube was placed to gravity drainage. The patient tolerated the procedure well.  Attention was next turned to the right flank. The right flank was interrogated with ultrasound. The hydronephrotic right kidney was successfully identified. An appropriate skin entry site overlying a posterior caval ex was identified and marked. Local anesthesia was attained by infiltration with 1% lidocaine. A small dermatotomy was made. Under real-time sonographic guidance, a 21 gauge Accustick needle was used to puncture a posterior lower pole calyx. Clear urine returned through the needle. The 0.018 inch wire was advanced into the renal pelvis. The Accustick needle was exchanged for the Accustick sheath. Position within the renal collecting system was confirmed by gentle hand injection of contrast material. A J-wire was advanced through the Accustick sheath and coiled within the pelvis. The Accustick sheath was removed. The skin tract was dilated to 10 [place] and a [place] all-purpose drainage  catheter was advanced over the wire and positioned with the locking loop in the renal pelvis. The tube was secured to the skin with 0 Prolene suture and connected to gravity bag drainage. The patient tolerated the procedure well without evidence of complication.  ANESTHESIA/SEDATION: Moderate (conscious) sedation was used. 0.5 mg Versed, 25 mcg Fentanyl were administered intravenously. The patient's vital signs were monitored continuously by radiology nursing throughout the procedure. 25 mg of Benadryl was also administered intravenously an addition to 2 g Ancef within 1 hr of skin incision.  Sedation Time: 13 minutes  CONTRAST:  10 cc Omnipaque 300  FLUOROSCOPY TIME:  54 seconds  PROCEDURE: 1. Ultrasound-guided puncture of the left renal collecting system 2. Placement of a 10 French percutaneous nephrostomy tube under fluoroscopic guidance 3. Ultrasound guided puncture of the right renal collecting system 4. Placement of a 10 French percutaneous nephrostomy tube under fluoroscopic  guidance Interventional Radiologist:  Criselda Peaches, MD  IMPRESSION: Successful placement of bilateral 10 French percutaneous nephrostomy tubes.  Return to interventional radiology in 4 weeks for routine bilateral tube check and change. At that time, an attempt could be made to internalize to double-J ureteral stents, or nephro ureteral stents.  Signed,  Criselda Peaches, MD  [facility]  [facility]   Electronically Signed   By: Jacqulynn Cadet M.D.   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 She is currently admitted with acute on chronic renal failure which is thought to be exacerbated by the underlying obstructed uropathy. Bilateral percutaneous nephrostomy tubes are warranted for decompression in an effort to salvage renal function. She was anticoagulated on Coumadin at her initial  presentation. She has so far received 3 units of FFP and her INR is currently 1.6. A 4th unit of FFP is currently running and will be continued throughout the procedure.  EXAM: IR ULTRASOUND GUIDANCE TISSUE ABLATION; PERC NEPHROSTOMY*L*; PERC NEPHROSTOMY*R*  Date: 03/24/2013  TECHNIQUE: Informed consent was obtained from the patient following explanation of the procedure, risks, benefits and alternatives. The patient understands, agrees and consents for the procedure. All questions were addressed. A time out was performed.  Maximal barrier sterile technique utilized including caps, mask, sterile gowns, sterile gloves, large sterile drape, hand hygiene, and Betadine skin prep.  The left flank was interrogated with ultrasound. The hydronephrotic left kidney was successfully identified. An appropriate skin entry site overlying a posterior calyx was identified and marked. Local anesthesia was attained by infiltration with 1% lidocaine. A small dermatotomy was made. Under real-time sonographic guidance, a 21 gauge Accustick needle was used to puncture a posterior interpolar calyx. Clear urine returned through the needle. An image was obtained and stored for the medical record. The 0.018 inch wire was advanced into the renal collecting system. The Accustick sheath was then advanced over the wire and positioned within the renal pelvis. Position was confirmed with a gentle hand injection of contrast material. A Rosen wire was advanced into the renal pelvis. The tract was then subsequently dilated to 10 [place] and a [place] all-purpose drain advanced over the wire and positioned with the locking loop in the renal pelvis. A final image was obtained and stored. The tube was secured to the skin with 0 Prolene suture and a plastic bumper. The tube was placed to gravity drainage. The patient tolerated the procedure well.  Attention was next turned to the right flank. The right flank was interrogated with ultrasound. The  hydronephrotic right kidney was successfully identified. An appropriate skin entry site overlying a posterior caval ex was identified and marked. Local anesthesia was attained by infiltration with 1% lidocaine. A small dermatotomy was made. Under real-time sonographic guidance, a 21 gauge Accustick needle was used to puncture a posterior lower pole calyx. Clear urine returned through the needle. The 0.018 inch wire was advanced into the renal pelvis. The Accustick needle was exchanged for the Accustick sheath. Position within the renal collecting system was confirmed by gentle hand injection of contrast material. A J-wire was advanced through the Accustick sheath and coiled within the pelvis. The Accustick sheath was removed. The skin tract was dilated to 10 [place] and a [place] all-purpose drainage catheter was advanced over the wire and positioned with the locking loop in the renal pelvis. The tube was secured  to the skin with 0 Prolene suture and connected to gravity bag drainage. The patient tolerated the procedure well without evidence of complication.  ANESTHESIA/SEDATION: Moderate (conscious) sedation was used. 0.5 mg Versed, 25 mcg Fentanyl were administered intravenously. The patient's vital signs were monitored continuously by radiology nursing throughout the procedure. 25 mg of Benadryl was also administered intravenously an addition to 2 g Ancef within 1 hr of skin incision.  Sedation Time: 13 minutes  CONTRAST:  10 cc Omnipaque 300  FLUOROSCOPY TIME:  54 seconds  PROCEDURE: 1. Ultrasound-guided puncture of the left renal collecting system 2. Placement of a 10 French percutaneous nephrostomy tube under fluoroscopic guidance 3. Ultrasound guided puncture of the right renal collecting system 4. Placement of a 10 French percutaneous nephrostomy tube under fluoroscopic guidance Interventional Radiologist:  Criselda Peaches, MD  IMPRESSION: Successful placement of bilateral 10 French percutaneous  nephrostomy tubes.  Return to interventional radiology in 4 weeks for routine bilateral tube check and change. At that time, an attempt could be made to internalize to double-J ureteral stents, or nephro ureteral stents.  Signed,  Criselda Peaches, MD  [facility]  [facility]   Electronically Signed   By: Jacqulynn Cadet M.D.   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 She is currently admitted with acute on chronic renal failure which is thought to be exacerbated by the underlying obstructed uropathy. Bilateral percutaneous nephrostomy tubes are warranted for decompression in an effort to salvage renal function. She was anticoagulated on Coumadin at her initial presentation. She has so far received 3 units of FFP and her INR is currently 1.6. A 4th unit of FFP is currently running and will be continued throughout the procedure.  EXAM: IR ULTRASOUND GUIDANCE TISSUE ABLATION; PERC NEPHROSTOMY*L*; PERC NEPHROSTOMY*R*  Date: 03/24/2013  TECHNIQUE: Informed consent was obtained from the patient following explanation of the procedure, risks, benefits and alternatives. The patient understands, agrees and consents for the procedure. All questions were addressed. A time out was performed.  Maximal barrier sterile technique utilized including caps, mask, sterile gowns, sterile gloves, large sterile drape, hand hygiene, and Betadine skin prep.  The left flank was interrogated with ultrasound. The hydronephrotic left kidney was successfully identified. An appropriate skin entry site overlying a posterior calyx was identified and marked. Local anesthesia was attained by infiltration with 1% lidocaine. A small dermatotomy was made. Under real-time sonographic guidance, a 21 gauge Accustick needle was used to puncture a posterior interpolar calyx. Clear urine returned through the needle. An  image was obtained and stored for the medical record. The 0.018 inch wire was advanced into the renal collecting system. The Accustick sheath was then advanced over the wire and positioned within the renal pelvis. Position was confirmed with a gentle hand injection of contrast material. A Rosen wire was advanced into the renal pelvis. The tract was then subsequently dilated to 10 [place] and a [place] all-purpose drain advanced over the wire and positioned with the locking loop in the renal pelvis. A final image was obtained and stored. The tube was secured to the skin with 0 Prolene suture and a plastic bumper. The tube was placed to gravity drainage. The patient tolerated the procedure well.  Attention was next turned to the right flank. The right flank was interrogated with ultrasound. The hydronephrotic right kidney was successfully identified. An appropriate skin entry  site overlying a posterior caval ex was identified and marked. Local anesthesia was attained by infiltration with 1% lidocaine. A small dermatotomy was made. Under real-time sonographic guidance, a 21 gauge Accustick needle was used to puncture a posterior lower pole calyx. Clear urine returned through the needle. The 0.018 inch wire was advanced into the renal pelvis. The Accustick needle was exchanged for the Accustick sheath. Position within the renal collecting system was confirmed by gentle hand injection of contrast material. A J-wire was advanced through the Accustick sheath and coiled within the pelvis. The Accustick sheath was removed. The skin tract was dilated to 10 [place] and a [place] all-purpose drainage catheter was advanced over the wire and positioned with the locking loop in the renal pelvis. The tube was secured to the skin with 0 Prolene suture and connected to gravity bag drainage. The patient tolerated the procedure well without evidence of complication.  ANESTHESIA/SEDATION: Moderate (conscious) sedation was  used. 0.5 mg Versed, 25 mcg Fentanyl were administered intravenously. The patient's vital signs were monitored continuously by radiology nursing throughout the procedure. 25 mg of Benadryl was also administered intravenously an addition to 2 g Ancef within 1 hr of skin incision.  Sedation Time: 13 minutes  CONTRAST:  10 cc Omnipaque 300  FLUOROSCOPY TIME:  54 seconds  PROCEDURE: 1. Ultrasound-guided puncture of the left renal collecting system 2. Placement of a 10 French percutaneous nephrostomy tube under fluoroscopic guidance 3. Ultrasound guided puncture of the right renal collecting system 4. Placement of a 10 French percutaneous nephrostomy tube under fluoroscopic guidance Interventional Radiologist:  Criselda Peaches, MD  IMPRESSION: Successful placement of bilateral 10 French percutaneous nephrostomy tubes.  Return to interventional radiology in 4 weeks for routine bilateral tube check and change. At that time, an attempt could be made to internalize to double-J ureteral stents, or nephro ureteral stents.  Signed,  Criselda Peaches, MD  [facility]  [facility]   Electronically Signed   By: Jacqulynn Cadet M.D.   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 She is currently admitted with acute on chronic renal failure which is thought to be exacerbated by the underlying obstructed uropathy. Bilateral percutaneous nephrostomy tubes are warranted for decompression in an effort to salvage renal function. She was anticoagulated on Coumadin at her initial presentation. She has so far received 3 units of FFP and her INR is currently 1.6. A 4th unit of FFP is currently running and will be continued throughout the procedure.  EXAM: IR ULTRASOUND GUIDANCE TISSUE ABLATION; PERC NEPHROSTOMY*L*; PERC NEPHROSTOMY*R*  Date: 03/24/2013  TECHNIQUE: Informed consent was obtained from the  patient following explanation of the procedure, risks, benefits and alternatives. The patient understands, agrees and consents for the procedure. All questions were addressed. A time out was performed.  Maximal barrier sterile technique utilized including caps, mask, sterile gowns, sterile gloves, large sterile drape, hand hygiene, and Betadine skin prep.  The left flank was interrogated with ultrasound. The hydronephrotic left kidney was successfully identified. An appropriate skin entry site overlying a posterior calyx was identified and marked. Local anesthesia was attained by infiltration with 1% lidocaine. A small dermatotomy was made. Under real-time sonographic guidance, a 21 gauge Accustick needle was used to puncture a posterior interpolar calyx. Clear urine returned through the needle. An image was obtained and stored for the medical record. The 0.018 inch  wire was advanced into the renal collecting system. The Accustick sheath was then advanced over the wire and positioned within the renal pelvis. Position was confirmed with a gentle hand injection of contrast material. A Rosen wire was advanced into the renal pelvis. The tract was then subsequently dilated to 10 [place] and a [place] all-purpose drain advanced over the wire and positioned with the locking loop in the renal pelvis. A final image was obtained and stored. The tube was secured to the skin with 0 Prolene suture and a plastic bumper. The tube was placed to gravity drainage. The patient tolerated the procedure well.  Attention was next turned to the right flank. The right flank was interrogated with ultrasound. The hydronephrotic right kidney was successfully identified. An appropriate skin entry site overlying a posterior caval ex was identified and marked. Local anesthesia was attained by infiltration with 1% lidocaine. A small dermatotomy was made. Under real-time sonographic guidance, a 21 gauge Accustick needle was used to puncture a  posterior lower pole calyx. Clear urine returned through the needle. The 0.018 inch wire was advanced into the renal pelvis. The Accustick needle was exchanged for the Accustick sheath. Position within the renal collecting system was confirmed by gentle hand injection of contrast material. A J-wire was advanced through the Accustick sheath and coiled within the pelvis. The Accustick sheath was removed. The skin tract was dilated to 10 [place] and a [place] all-purpose drainage catheter was advanced over the wire and positioned with the locking loop in the renal pelvis. The tube was secured to the skin with 0 Prolene suture and connected to gravity bag drainage. The patient tolerated the procedure well without evidence of complication.  ANESTHESIA/SEDATION: Moderate (conscious) sedation was used. 0.5 mg Versed, 25 mcg Fentanyl were administered intravenously. The patient's vital signs were monitored continuously by radiology nursing throughout the procedure. 25 mg of Benadryl was also administered intravenously an addition to 2 g Ancef within 1 hr of skin incision.  Sedation Time: 13 minutes  CONTRAST:  10 cc Omnipaque 300  FLUOROSCOPY TIME:  54 seconds  PROCEDURE: 1. Ultrasound-guided puncture of the left renal collecting system 2. Placement of a 10 French percutaneous nephrostomy tube under fluoroscopic guidance 3. Ultrasound guided puncture of the right renal collecting system 4. Placement of a 10 French percutaneous nephrostomy tube under fluoroscopic guidance Interventional Radiologist:  Criselda Peaches, MD  IMPRESSION: Successful placement of bilateral 10 French percutaneous nephrostomy tubes.  Return to interventional radiology in 4 weeks for routine bilateral tube check and change. At that time, an attempt could be made to internalize to double-J ureteral stents, or nephro ureteral stents.  Signed,  Criselda Peaches, MD  [facility]  [facility]    Electronically Signed   By: Jacqulynn Cadet M.D.   On: 03/24/2013 17:26   Urine culture from 03/22/13: > 100K ecoli, resistant to cipro and ampicillin.  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 She has associated acute renal insufficiency. This has improved after the placement of neph tubes  Stop heparin gtt at midnight to be safe - allow time for flexibility in OR Monday - scheduled for midday  On ceftriaxone for UTI  Would recommend transfusing to get hgb >8 for procedure tomorrow.   LOS: 4 days   Ardis Hughs 03/26/2013, 10:15 AM

## 2013-03-26 NOTE — Progress Notes (Signed)
Subjective: Patient admits to tenderness at drain sites. She denies any lightheadedness or dizziness when in bed, but does admit to some lightheadedness when walking from bathroom earlier.  Objective: Physical Exam: BP 134/51  Pulse 73  Temp(Src) 98.7 F (37.1 C) (Oral)  Resp 16  Ht 5\' 1"  (1.549 m)  Wt 160 lb 0.9 oz (72.6 kg)  BMI 30.26 kg/m2  SpO2 100%  General: A&Ox3, NAD  Abd: Soft, NT, ND  Right PCN dressing C/D/I, ouput bloody 1305cc (24 hr)  Left PCN dressing C/D/I, output bloody 530cc (24hr)  Labs: CBC  Recent Labs  03/25/13 0439 03/26/13 0321  WBC 8.9 8.6  HGB 7.1* 6.7*  HCT 23.7* 21.7*  PLT 385 381   BMET  Recent Labs  03/25/13 0439 03/26/13 0321  NA 137 138  K 4.8 4.1  CL 103 105  CO2 22 20  GLUCOSE 150* 126*  BUN 52* 41*  CREATININE 4.03* 3.15*  CALCIUM 8.4 8.4   LFT No results found for this basename: PROT, ALBUMIN, AST, ALT, ALKPHOS, BILITOT, BILIDIR, IBILI, LIPASE,  in the last 72 hours PT/INR  Recent Labs  03/24/13 0445 03/24/13 1407  LABPROT 21.8* 18.7*  INR 1.97* 1.61*     Studies/Results: Ir Perc Nephrostomy Left  03/24/2013   CLINICAL DATA:  73 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 She is currently admitted with acute on chronic renal failure which is thought to be exacerbated by the underlying obstructed uropathy. Bilateral percutaneous nephrostomy tubes are warranted for decompression in an effort to salvage renal function. She was anticoagulated on Coumadin at her initial presentation. She has so far received 3 units of FFP and her INR is currently 1.6. A 4th unit of FFP is currently running and will be continued throughout the procedure.  EXAM: IR ULTRASOUND GUIDANCE TISSUE ABLATION; PERC NEPHROSTOMY*L*; PERC NEPHROSTOMY*R*  Date: 03/24/2013  TECHNIQUE: Informed consent was obtained from the patient following explanation of the procedure, risks, benefits and alternatives. The patient understands, agrees and consents for  the procedure. All questions were addressed. A time out was performed.  Maximal barrier sterile technique utilized including caps, mask, sterile gowns, sterile gloves, large sterile drape, hand hygiene, and Betadine skin prep.  The left flank was interrogated with ultrasound. The hydronephrotic left kidney was successfully identified. An appropriate skin entry site overlying a posterior calyx was identified and marked. Local anesthesia was attained by infiltration with 1% lidocaine. A small dermatotomy was made. Under real-time sonographic guidance, a 21 gauge Accustick needle was used to puncture a posterior interpolar calyx. Clear urine returned through the needle. An image was obtained and stored for the medical record. The 0.018 inch wire was advanced into the renal collecting system. The Accustick sheath was then advanced over the wire and positioned within the renal pelvis. Position was confirmed with a gentle hand injection of contrast material. A Rosen wire was advanced into the renal pelvis. The tract was then subsequently dilated to 10 [place] and a [place] all-purpose drain advanced over the wire and positioned with the locking loop in the renal pelvis. A final image was obtained and stored. The tube was secured to the skin with 0 Prolene suture and a plastic bumper. The tube was placed to gravity drainage. The patient tolerated the procedure well.  Attention was next turned to the right flank. The right flank was interrogated with ultrasound. The hydronephrotic right kidney was successfully identified. An appropriate skin entry site overlying a posterior caval ex was identified and marked.  Local anesthesia was attained by infiltration with 1% lidocaine. A small dermatotomy was made. Under real-time sonographic guidance, a 21 gauge Accustick needle was used to puncture a posterior lower pole calyx. Clear urine returned through the needle. The 0.018 inch wire was advanced into the renal pelvis. The  Accustick needle was exchanged for the Accustick sheath. Position within the renal collecting system was confirmed by gentle hand injection of contrast material. A J-wire was advanced through the Accustick sheath and coiled within the pelvis. The Accustick sheath was removed. The skin tract was dilated to 10 [place] and a [place] all-purpose drainage catheter was advanced over the wire and positioned with the locking loop in the renal pelvis. The tube was secured to the skin with 0 Prolene suture and connected to gravity bag drainage. The patient tolerated the procedure well without evidence of complication.  ANESTHESIA/SEDATION: Moderate (conscious) sedation was used. 0.5 mg Versed, 25 mcg Fentanyl were administered intravenously. The patient's vital signs were monitored continuously by radiology nursing throughout the procedure. 25 mg of Benadryl was also administered intravenously an addition to 2 g Ancef within 1 hr of skin incision.  Sedation Time: 13 minutes  CONTRAST:  10 cc Omnipaque 300  FLUOROSCOPY TIME:  54 seconds  PROCEDURE: 1. Ultrasound-guided puncture of the left renal collecting system 2. Placement of a 10 French percutaneous nephrostomy tube under fluoroscopic guidance 3. Ultrasound guided puncture of the right renal collecting system 4. Placement of a 10 French percutaneous nephrostomy tube under fluoroscopic guidance Interventional Radiologist:  Criselda Peaches, MD  IMPRESSION: Successful placement of bilateral 10 French percutaneous nephrostomy tubes.  Return to interventional radiology in 4 weeks for routine bilateral tube check and change. At that time, an attempt could be made to internalize to double-J ureteral stents, or nephro ureteral stents.  Signed,  Criselda Peaches, MD  [facility]  [facility]   Electronically Signed   By: Jacqulynn Cadet M.D.   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 She is currently admitted with acute on chronic renal failure which is thought to be exacerbated by the underlying obstructed uropathy. Bilateral percutaneous nephrostomy tubes are warranted for decompression in an effort to salvage renal function. She was anticoagulated on Coumadin at her initial presentation. She has so far received 3 units of FFP and her INR is currently 1.6. A 4th unit of FFP is currently running and will be continued throughout the procedure.  EXAM: IR ULTRASOUND GUIDANCE TISSUE ABLATION; PERC NEPHROSTOMY*L*; PERC NEPHROSTOMY*R*  Date: 03/24/2013  TECHNIQUE: Informed consent was obtained from the patient following explanation of the procedure, risks, benefits and alternatives. The patient understands, agrees and consents for the procedure. All questions were addressed. A time out was performed.  Maximal barrier sterile technique utilized including caps, mask, sterile gowns, sterile gloves, large sterile drape, hand hygiene, and Betadine skin prep.  The left flank was interrogated with ultrasound. The hydronephrotic left kidney was successfully identified. An appropriate skin entry site overlying a posterior calyx was identified and marked. Local anesthesia was attained by infiltration with 1% lidocaine. A small dermatotomy was made. Under real-time sonographic guidance, a 21 gauge Accustick needle was used to puncture a posterior interpolar calyx. Clear urine returned through the needle. An image was obtained and stored for the medical record. The 0.018 inch wire was advanced into the renal collecting system. The Accustick sheath  was then advanced over the wire and positioned within the renal pelvis. Position was confirmed with a gentle hand injection of contrast material. A Rosen wire was advanced into the renal pelvis. The tract was then subsequently dilated to 10 [place] and a [place] all-purpose drain advanced over the wire and  positioned with the locking loop in the renal pelvis. A final image was obtained and stored. The tube was secured to the skin with 0 Prolene suture and a plastic bumper. The tube was placed to gravity drainage. The patient tolerated the procedure well.  Attention was next turned to the right flank. The right flank was interrogated with ultrasound. The hydronephrotic right kidney was successfully identified. An appropriate skin entry site overlying a posterior caval ex was identified and marked. Local anesthesia was attained by infiltration with 1% lidocaine. A small dermatotomy was made. Under real-time sonographic guidance, a 21 gauge Accustick needle was used to puncture a posterior lower pole calyx. Clear urine returned through the needle. The 0.018 inch wire was advanced into the renal pelvis. The Accustick needle was exchanged for the Accustick sheath. Position within the renal collecting system was confirmed by gentle hand injection of contrast material. A J-wire was advanced through the Accustick sheath and coiled within the pelvis. The Accustick sheath was removed. The skin tract was dilated to 10 [place] and a [place] all-purpose drainage catheter was advanced over the wire and positioned with the locking loop in the renal pelvis. The tube was secured to the skin with 0 Prolene suture and connected to gravity bag drainage. The patient tolerated the procedure well without evidence of complication.  ANESTHESIA/SEDATION: Moderate (conscious) sedation was used. 0.5 mg Versed, 25 mcg Fentanyl were administered intravenously. The patient's vital signs were monitored continuously by radiology nursing throughout the procedure. 25 mg of Benadryl was also administered intravenously an addition to 2 g Ancef within 1 hr of skin incision.  Sedation Time: 13 minutes  CONTRAST:  10 cc Omnipaque 300  FLUOROSCOPY TIME:  54 seconds  PROCEDURE: 1. Ultrasound-guided puncture of the left renal collecting system 2.  Placement of a 10 French percutaneous nephrostomy tube under fluoroscopic guidance 3. Ultrasound guided puncture of the right renal collecting system 4. Placement of a 10 French percutaneous nephrostomy tube under fluoroscopic guidance Interventional Radiologist:  Criselda Peaches, MD  IMPRESSION: Successful placement of bilateral 10 French percutaneous nephrostomy tubes.  Return to interventional radiology in 4 weeks for routine bilateral tube check and change. At that time, an attempt could be made to internalize to double-J ureteral stents, or nephro ureteral stents.  Signed,  Criselda Peaches, MD  [facility]  [facility]   Electronically Signed   By: Jacqulynn Cadet M.D.   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 She is currently admitted with acute on chronic renal failure which is thought to be exacerbated by the underlying obstructed uropathy. Bilateral percutaneous nephrostomy tubes are warranted for decompression in an effort to salvage renal function. She was anticoagulated on Coumadin at her initial presentation. She has so far received 3 units of FFP and her INR is currently 1.6. A 4th unit of FFP is currently running and will be continued throughout the procedure.  EXAM: IR ULTRASOUND GUIDANCE TISSUE ABLATION; PERC NEPHROSTOMY*L*; PERC NEPHROSTOMY*R*  Date: 03/24/2013  TECHNIQUE: Informed consent was obtained from the patient following explanation of  the procedure, risks, benefits and alternatives. The patient understands, agrees and consents for the procedure. All questions were addressed. A time out was performed.  Maximal barrier sterile technique utilized including caps, mask, sterile gowns, sterile gloves, large sterile drape, hand hygiene, and Betadine skin prep.  The left flank was interrogated with ultrasound. The hydronephrotic left kidney was  successfully identified. An appropriate skin entry site overlying a posterior calyx was identified and marked. Local anesthesia was attained by infiltration with 1% lidocaine. A small dermatotomy was made. Under real-time sonographic guidance, a 21 gauge Accustick needle was used to puncture a posterior interpolar calyx. Clear urine returned through the needle. An image was obtained and stored for the medical record. The 0.018 inch wire was advanced into the renal collecting system. The Accustick sheath was then advanced over the wire and positioned within the renal pelvis. Position was confirmed with a gentle hand injection of contrast material. A Rosen wire was advanced into the renal pelvis. The tract was then subsequently dilated to 10 [place] and a [place] all-purpose drain advanced over the wire and positioned with the locking loop in the renal pelvis. A final image was obtained and stored. The tube was secured to the skin with 0 Prolene suture and a plastic bumper. The tube was placed to gravity drainage. The patient tolerated the procedure well.  Attention was next turned to the right flank. The right flank was interrogated with ultrasound. The hydronephrotic right kidney was successfully identified. An appropriate skin entry site overlying a posterior caval ex was identified and marked. Local anesthesia was attained by infiltration with 1% lidocaine. A small dermatotomy was made. Under real-time sonographic guidance, a 21 gauge Accustick needle was used to puncture a posterior lower pole calyx. Clear urine returned through the needle. The 0.018 inch wire was advanced into the renal pelvis. The Accustick needle was exchanged for the Accustick sheath. Position within the renal collecting system was confirmed by gentle hand injection of contrast material. A J-wire was advanced through the Accustick sheath and coiled within the pelvis. The Accustick sheath was removed. The skin tract was dilated to 10  [place] and a [place] all-purpose drainage catheter was advanced over the wire and positioned with the locking loop in the renal pelvis. The tube was secured to the skin with 0 Prolene suture and connected to gravity bag drainage. The patient tolerated the procedure well without evidence of complication.  ANESTHESIA/SEDATION: Moderate (conscious) sedation was used. 0.5 mg Versed, 25 mcg Fentanyl were administered intravenously. The patient's vital signs were monitored continuously by radiology nursing throughout the procedure. 25 mg of Benadryl was also administered intravenously an addition to 2 g Ancef within 1 hr of skin incision.  Sedation Time: 13 minutes  CONTRAST:  10 cc Omnipaque 300  FLUOROSCOPY TIME:  54 seconds  PROCEDURE: 1. Ultrasound-guided puncture of the left renal collecting system 2. Placement of a 10 French percutaneous nephrostomy tube under fluoroscopic guidance 3. Ultrasound guided puncture of the right renal collecting system 4. Placement of a 10 French percutaneous nephrostomy tube under fluoroscopic guidance Interventional Radiologist:  Sterling Big, MD  IMPRESSION: Successful placement of bilateral 10 French percutaneous nephrostomy tubes.  Return to interventional radiology in 4 weeks for routine bilateral tube check and change. At that time, an attempt could be made to internalize to double-J ureteral stents, or nephro ureteral stents.  Signed,  Sterling Big, MD  [facility]  [facility]  Electronically Signed   By: Jacqulynn Cadet M.D.   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 She is currently admitted with acute on chronic renal failure which is thought to be exacerbated by the underlying obstructed uropathy. Bilateral percutaneous nephrostomy tubes are warranted for decompression in an effort to salvage renal function. She  was anticoagulated on Coumadin at her initial presentation. She has so far received 3 units of FFP and her INR is currently 1.6. A 4th unit of FFP is currently running and will be continued throughout the procedure.  EXAM: IR ULTRASOUND GUIDANCE TISSUE ABLATION; PERC NEPHROSTOMY*L*; PERC NEPHROSTOMY*R*  Date: 03/24/2013  TECHNIQUE: Informed consent was obtained from the patient following explanation of the procedure, risks, benefits and alternatives. The patient understands, agrees and consents for the procedure. All questions were addressed. A time out was performed.  Maximal barrier sterile technique utilized including caps, mask, sterile gowns, sterile gloves, large sterile drape, hand hygiene, and Betadine skin prep.  The left flank was interrogated with ultrasound. The hydronephrotic left kidney was successfully identified. An appropriate skin entry site overlying a posterior calyx was identified and marked. Local anesthesia was attained by infiltration with 1% lidocaine. A small dermatotomy was made. Under real-time sonographic guidance, a 21 gauge Accustick needle was used to puncture a posterior interpolar calyx. Clear urine returned through the needle. An image was obtained and stored for the medical record. The 0.018 inch wire was advanced into the renal collecting system. The Accustick sheath was then advanced over the wire and positioned within the renal pelvis. Position was confirmed with a gentle hand injection of contrast material. A Rosen wire was advanced into the renal pelvis. The tract was then subsequently dilated to 10 [place] and a [place] all-purpose drain advanced over the wire and positioned with the locking loop in the renal pelvis. A final image was obtained and stored. The tube was secured to the skin with 0 Prolene suture and a plastic bumper. The tube was placed to gravity drainage. The patient tolerated the procedure well.  Attention was next turned to the right flank. The right  flank was interrogated with ultrasound. The hydronephrotic right kidney was successfully identified. An appropriate skin entry site overlying a posterior caval ex was identified and marked. Local anesthesia was attained by infiltration with 1% lidocaine. A small dermatotomy was made. Under real-time sonographic guidance, a 21 gauge Accustick needle was used to puncture a posterior lower pole calyx. Clear urine returned through the needle. The 0.018 inch wire was advanced into the renal pelvis. The Accustick needle was exchanged for the Accustick sheath. Position within the renal collecting system was confirmed by gentle hand injection of contrast material. A J-wire was advanced through the Accustick sheath and coiled within the pelvis. The Accustick sheath was removed. The skin tract was dilated to 10 [place] and a [place] all-purpose drainage catheter was advanced over the wire and positioned with the locking loop in the renal pelvis. The tube was secured to the skin with 0 Prolene suture and connected to gravity bag drainage. The patient tolerated the procedure well without evidence of complication.  ANESTHESIA/SEDATION: Moderate (conscious) sedation was used. 0.5 mg Versed, 25 mcg Fentanyl were administered intravenously. The patient's vital signs were monitored continuously by radiology nursing throughout the procedure. 25 mg of Benadryl was also administered intravenously an addition to 2 g Ancef within 1 hr  of skin incision.  Sedation Time: 13 minutes  CONTRAST:  10 cc Omnipaque 300  FLUOROSCOPY TIME:  54 seconds  PROCEDURE: 1. Ultrasound-guided puncture of the left renal collecting system 2. Placement of a 10 French percutaneous nephrostomy tube under fluoroscopic guidance 3. Ultrasound guided puncture of the right renal collecting system 4. Placement of a 10 French percutaneous nephrostomy tube under fluoroscopic guidance Interventional Radiologist:  Criselda Peaches, MD  IMPRESSION: Successful  placement of bilateral 10 French percutaneous nephrostomy tubes.  Return to interventional radiology in 4 weeks for routine bilateral tube check and change. At that time, an attempt could be made to internalize to double-J ureteral stents, or nephro ureteral stents.  Signed,  Criselda Peaches, MD  [facility]  [facility]   Electronically Signed   By: Jacqulynn Cadet M.D.   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  On IV heparin gtt for chronic DVT/PE history while off coumadin, d/c at midnight for urology procedure 3/9.  Cr 3.15 (4.03), wbc WNL, afebrile.  Plans per Urology, possible cystoscopy, TURBT, bilateral retrogrades and possible ureteroscopy on Monday.    LOS: 4 days    Hedy Jacob PA-C 03/26/2013 10:30 AM

## 2013-03-26 NOTE — Progress Notes (Signed)
Pharmacy Consult Note - IV Heparin Follow Up  Labs: heparin level 0.41  A/P: heparin level therapeutic (goal 0.3-0.7) on rate of 1600 units/hr. Continue current rate until midnight when drip is supposed to stop as planned already.  Adrian Saran, PharmD, BCPS Pager [phone] 03/26/2013 8:41 PM

## 2013-03-26 NOTE — Progress Notes (Signed)
[facility] PROGRESS NOTE  Heather [name] [id number] DOB: September 25, 1940 DOA: 03/22/2013 PCP: Rogene Houston, MD  Assessment/Plan: 1. 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  - Urology following - plan for TURBT tomorrow - s/p nephrostomy tube placement with steady improvement in renal function - Cont to follow 2. Diabetes mellitus  Consider resuming home dose of insulin as pt is eating 3. Hypertension, chronic diastolic dysfunction  - Holding Lasix and reduced the Toprol to 50 daily at present, continue clonidine.  4. Active smoking, bilateral wheezing  -cont duonebs PRN 5. Hypothyroidism  Continue Synthroid as tolerated 6. Acute Blood Loss Anemia - Likely secondary to blood loss via nephrostomy tubes while on anticoagulation - 1 unit PRBC tx this AM for 6.7 hgb - Will follow q4 h/h and transfuse as needed to maintain hgb >8 in anticipation for tomorrow's surgery 7.  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  Code Status: DNR Family Communication: Pt and family in room (indicate person spoken with, relationship, and if by phone, the number) Disposition Plan: Pending  Consultants:  Urology  Procedures:    Antibiotics:    HPI/Subjective: Noted to be anemic, now s/p 1 unit PRBC. No complaints.  Objective: Filed Vitals:   03/26/13 0557 03/26/13 0657 03/26/13 0817 03/26/13 0915  BP: 107/45 114/52 121/45 134/51  Pulse: 79 77 68 73  Temp: 98.7 F (37.1 C) 98.6 F (37 C) 98.8 F (37.1 C) 98.7 F (37.1 C)  TempSrc: Oral Oral Oral Oral  Resp: 20 16  16 16   Height:      Weight: 72.6 kg (160 lb 0.9 oz)     SpO2: 96% 98% 98% 100%    Intake/Output Summary (Last 24 hours) at 03/26/13 1317 Last data filed at 03/26/13 1048  Gross per 24 hour  Intake 1277.25 ml  Output   2085 ml  Net -807.75 ml   Filed Weights   03/22/13 2319 03/25/13 0500 03/26/13 0557  Weight: 66.5 kg (146 lb 9.7 oz) 71.759 kg (158 lb 3.2 oz) 72.6 kg (160 lb 0.9 oz)    Exam:  General:  Awake, in nad  Cardiovascular: regular, s1, s2  Respiratory: normal resp effort, no wheezing  Abdomen: soft, nondistended  Musculoskeletal: perfused, no clubbing   Data Reviewed: Basic Metabolic Panel:  Recent Labs Lab 03/22/13 1803 03/23/13 0530 03/24/13 1407 03/25/13 0439 03/26/13 0321  NA 136* 136* 136* 137 138  K 5.0 5.5* 5.4* 4.8 4.1  CL 98 102 101 103 105  CO2 22 22 22 22 20   GLUCOSE 101* 133* 89 150* 126*  BUN 53* 55* 54* 52* 41*  CREATININE 3.40* 3.79* 4.49* 4.03* 3.15*  CALCIUM 9.6 8.6 9.0 8.4 8.4   Liver Function Tests:  Recent Labs Lab 03/22/13 1803 03/23/13 0530  AST 23 17  ALT 10 7  ALKPHOS 244* 185*  BILITOT <0.2* <0.2*  PROT 8.5* 6.5  ALBUMIN 3.0* 2.2*   No results found for this basename: LIPASE, AMYLASE,  in the last 168 hours No results found for this basename: AMMONIA,  in the last 168 hours CBC:  Recent Labs  Lab 03/22/13 1650 03/22/13 1800 03/23/13 0530 03/24/13 0445 03/25/13 0439 03/26/13 0321  WBC 9.9  --  9.2 8.9 8.9 8.6  NEUTROABS 6.5  --   --   --   --   --   HGB 10.0* 11.2* 7.6* 7.5* 7.1* 6.7*  HCT 31.2* 33.0* 25.9* 24.2* 23.7* 21.7*  MCV 85.0  --  86.9 86.1 87.1 85.4  PLT 286  --  419* 401* 385 381   Cardiac Enzymes: No results found for this basename: CKTOTAL, CKMB, CKMBINDEX, TROPONINI,  in the last 168 hours BNP (last 3 results)  Recent Labs  03/03/13 2334  PROBNP 731.0*   CBG:  Recent Labs Lab 03/25/13 2025 03/26/13 0031 03/26/13 0326 03/26/13 0814 03/26/13 1201  GLUCAP 197* 139* 97 98 115*     Recent Results (from the past 240 hour(s))  URINE CULTURE     Status: None   Collection Time    03/22/13  5:02 PM      Result Value Ref Range Status   Specimen Description URINE, CLEAN CATCH   Final   Special Requests NONE   Final   Culture  Setup Time     Final   Value: 03/23/2013 05:15     Performed at [facility]     Final   Value: >=100,000 COLONIES/ML     Performed at [facility]   Culture     Final   Value: ESCHERICHIA COLI     Performed at [facility]   Report Status 03/25/2013 FINAL   Final   Organism ID, Bacteria ESCHERICHIA COLI   Final  MRSA PCR SCREENING     Status: None   Collection Time    03/24/13  2:05 PM      Result Value Ref Range Status   MRSA by PCR NEGATIVE  NEGATIVE Final   Comment:            The GeneXpert MRSA Assay (FDA     approved for NASAL specimens     only), is one component of a     comprehensive MRSA colonization     surveillance program. It is not     intended to diagnose MRSA     infection nor to guide or     monitor treatment for     MRSA infections.     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 She is currently admitted with acute on chronic renal failure which is thought to be exacerbated by the underlying obstructed uropathy. Bilateral percutaneous nephrostomy tubes are warranted for decompression in an effort to salvage renal function. She was anticoagulated on Coumadin at her initial presentation. She has so far received 3 units of FFP and her INR is currently 1.6. A 4th unit of FFP is currently running and will be continued throughout the procedure.  EXAM: IR ULTRASOUND GUIDANCE TISSUE ABLATION; PERC NEPHROSTOMY*L*; PERC NEPHROSTOMY*R*  Date: 03/24/2013  TECHNIQUE: Informed consent was obtained from the patient following explanation of the procedure, risks, benefits and alternatives. The patient understands, agrees and  consents for the procedure. All questions were addressed. A time out was performed.  Maximal barrier sterile technique utilized including caps, mask, sterile gowns, sterile gloves, large sterile drape, hand hygiene, and Betadine skin prep.  The left flank was interrogated with ultrasound. The hydronephrotic left kidney was successfully identified. An appropriate skin entry site overlying a posterior calyx was  identified and marked. Local anesthesia was attained by infiltration with 1% lidocaine. A small dermatotomy was made. Under real-time sonographic guidance, a 21 gauge Accustick needle was used to puncture a posterior interpolar calyx. Clear urine returned through the needle. An image was obtained and stored for the medical record. The 0.018 inch wire was advanced into the renal collecting system. The Accustick sheath was then advanced over the wire and positioned within the renal pelvis. Position was confirmed with a gentle hand injection of contrast material. A Rosen wire was advanced into the renal pelvis. The tract was then subsequently dilated to 10 [place] and a [place] all-purpose drain advanced over the wire and positioned with the locking loop in the renal pelvis. A final image was obtained and stored. The tube was secured to the skin with 0 Prolene suture and a plastic bumper. The tube was placed to gravity drainage. The patient tolerated the procedure well.  Attention was next turned to the right flank. The right flank was interrogated with ultrasound. The hydronephrotic right kidney was successfully identified. An appropriate skin entry site overlying a posterior caval ex was identified and marked. Local anesthesia was attained by infiltration with 1% lidocaine. A small dermatotomy was made. Under real-time sonographic guidance, a 21 gauge Accustick needle was used to puncture a posterior lower pole calyx. Clear urine returned through the needle. The 0.018 inch wire was advanced into the renal  pelvis. The Accustick needle was exchanged for the Accustick sheath. Position within the renal collecting system was confirmed by gentle hand injection of contrast material. A J-wire was advanced through the Accustick sheath and coiled within the pelvis. The Accustick sheath was removed. The skin tract was dilated to 10 [place] and a [place] all-purpose drainage catheter was advanced over the wire and positioned with the locking loop in the renal pelvis. The tube was secured to the skin with 0 Prolene suture and connected to gravity bag drainage. The patient tolerated the procedure well without evidence of complication.  ANESTHESIA/SEDATION: Moderate (conscious) sedation was used. 0.5 mg Versed, 25 mcg Fentanyl were administered intravenously. The patient's vital signs were monitored continuously by radiology nursing throughout the procedure. 25 mg of Benadryl was also administered intravenously an addition to 2 g Ancef within 1 hr of skin incision.  Sedation Time: 13 minutes  CONTRAST:  10 cc Omnipaque 300  FLUOROSCOPY TIME:  54 seconds  PROCEDURE: 1. Ultrasound-guided puncture of the left renal collecting system 2. Placement of a 10 French percutaneous nephrostomy tube under fluoroscopic guidance 3. Ultrasound guided puncture of the right renal collecting system 4. Placement of a 10 French percutaneous nephrostomy tube under fluoroscopic guidance Interventional Radiologist:  Criselda Peaches, MD  IMPRESSION: Successful placement of bilateral 10 French percutaneous nephrostomy tubes.  Return to interventional radiology in 4 weeks for routine bilateral tube check and change. At that time, an attempt could be made to internalize to double-J ureteral stents, or nephro ureteral stents.  Signed,  Criselda Peaches, MD  [facility]  [facility]   Electronically Signed   By: Jacqulynn Cadet M.D.   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 She is currently admitted with acute on chronic renal failure which is thought to be exacerbated by the underlying obstructed uropathy. Bilateral percutaneous nephrostomy tubes are warranted for decompression in an effort to salvage renal function. She was  anticoagulated on Coumadin at her initial presentation. She has so far received 3 units of FFP and her INR is currently 1.6. A 4th unit of FFP is currently running and will be continued throughout the procedure.  EXAM: IR ULTRASOUND GUIDANCE TISSUE ABLATION; PERC NEPHROSTOMY*L*; PERC NEPHROSTOMY*R*  Date: 03/24/2013  TECHNIQUE: Informed consent was obtained from the patient following explanation of the procedure, risks, benefits and alternatives. The patient understands, agrees and consents for the procedure. All questions were addressed. A time out was performed.  Maximal barrier sterile technique utilized including caps, mask, sterile gowns, sterile gloves, large sterile drape, hand hygiene, and Betadine skin prep.  The left flank was interrogated with ultrasound. The hydronephrotic left kidney was successfully identified. An appropriate skin entry site overlying a posterior calyx was identified and marked. Local anesthesia was attained by infiltration with 1% lidocaine. A small dermatotomy was made. Under real-time sonographic guidance, a 21 gauge Accustick needle was used to puncture a posterior interpolar calyx. Clear urine returned through the needle. An image was obtained and stored for the medical record. The 0.018 inch wire was advanced into the renal collecting system. The Accustick sheath was then advanced over the wire and positioned within the renal pelvis. Position was confirmed with a gentle hand injection of contrast material. A Rosen wire was advanced into the renal pelvis. The tract was then subsequently dilated to 10 [place] and a [place] all-purpose drain advanced over  the wire and positioned with the locking loop in the renal pelvis. A final image was obtained and stored. The tube was secured to the skin with 0 Prolene suture and a plastic bumper. The tube was placed to gravity drainage. The patient tolerated the procedure well.  Attention was next turned to the right flank. The right flank was interrogated with ultrasound. The hydronephrotic right kidney was successfully identified. An appropriate skin entry site overlying a posterior caval ex was identified and marked. Local anesthesia was attained by infiltration with 1% lidocaine. A small dermatotomy was made. Under real-time sonographic guidance, a 21 gauge Accustick needle was used to puncture a posterior lower pole calyx. Clear urine returned through the needle. The 0.018 inch wire was advanced into the renal pelvis. The Accustick needle was exchanged for the Accustick sheath. Position within the renal collecting system was confirmed by gentle hand injection of contrast material. A J-wire was advanced through the Accustick sheath and coiled within the pelvis. The Accustick sheath was removed. The skin tract was dilated to 10 [place] and a [place] all-purpose drainage catheter was advanced over the wire and positioned with the locking loop in the renal pelvis. The tube was secured to the skin with 0 Prolene suture and connected to gravity bag drainage. The patient tolerated the procedure well without evidence of complication.  ANESTHESIA/SEDATION: Moderate (conscious) sedation was used. 0.5 mg Versed, 25 mcg Fentanyl were administered intravenously. The patient's vital signs were monitored continuously by radiology nursing throughout the procedure. 25 mg of Benadryl was also administered intravenously an addition to 2 g Ancef within 1 hr of skin incision.  Sedation Time: 13 minutes  CONTRAST:  10 cc Omnipaque 300  FLUOROSCOPY TIME:  54 seconds  PROCEDURE: 1. Ultrasound-guided puncture of the left renal collecting  system 2. Placement of a 10 French percutaneous nephrostomy tube under fluoroscopic guidance 3. Ultrasound guided puncture of the right renal collecting system 4. Placement of a 10 French percutaneous nephrostomy tube under fluoroscopic guidance Interventional Radiologist:  Criselda Peaches,  MD  IMPRESSION: Successful placement of bilateral 10 French percutaneous nephrostomy tubes.  Return to interventional radiology in 4 weeks for routine bilateral tube check and change. At that time, an attempt could be made to internalize to double-J ureteral stents, or nephro ureteral stents.  Signed,  Criselda Peaches, MD  [facility]  [facility] Radiology   Electronically Signed   By: Jacqulynn Cadet M.D.   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 She is currently admitted with acute on chronic renal failure which is thought to be exacerbated by the underlying obstructed uropathy. Bilateral percutaneous nephrostomy tubes are warranted for decompression in an effort to salvage renal function. She was anticoagulated on Coumadin at her initial presentation. She has so far received 3 units of FFP and her INR is currently 1.6. A 4th unit of FFP is currently running and will be continued throughout the procedure.  EXAM: IR ULTRASOUND GUIDANCE TISSUE ABLATION; PERC NEPHROSTOMY*L*; PERC NEPHROSTOMY*R*  Date: 03/24/2013  TECHNIQUE: Informed consent was obtained from the patient following explanation of the procedure, risks, benefits and alternatives. The patient understands, agrees and consents for the procedure. All questions were addressed. A time out was performed.  Maximal barrier sterile technique utilized including caps, mask, sterile gowns, sterile gloves, large sterile drape, hand hygiene, and Betadine skin prep.  The left flank was interrogated with ultrasound. The hydronephrotic left kidney  was successfully identified. An appropriate skin entry site overlying a posterior calyx was identified and marked. Local anesthesia was attained by infiltration with 1% lidocaine. A small dermatotomy was made. Under real-time sonographic guidance, a 21 gauge Accustick needle was used to puncture a posterior interpolar calyx. Clear urine returned through the needle. An image was obtained and stored for the medical record. The 0.018 inch wire was advanced into the renal collecting system. The Accustick sheath was then advanced over the wire and positioned within the renal pelvis. Position was confirmed with a gentle hand injection of contrast material. A Rosen wire was advanced into the renal pelvis. The tract was then subsequently dilated to 10 [place] and a [place] all-purpose drain advanced over the wire and positioned with the locking loop in the renal pelvis. A final image was obtained and stored. The tube was secured to the skin with 0 Prolene suture and a plastic bumper. The tube was placed to gravity drainage. The patient tolerated the procedure well.  Attention was next turned to the right flank. The right flank was interrogated with ultrasound. The hydronephrotic right kidney was successfully identified. An appropriate skin entry site overlying a posterior caval ex was identified and marked. Local anesthesia was attained by infiltration with 1% lidocaine. A small dermatotomy was made. Under real-time sonographic guidance, a 21 gauge Accustick needle was used to puncture a posterior lower pole calyx. Clear urine returned through the needle. The 0.018 inch wire was advanced into the renal pelvis. The Accustick needle was exchanged for the Accustick sheath. Position within the renal collecting system was confirmed by gentle hand injection of contrast material. A J-wire was advanced through the Accustick sheath and coiled within the pelvis. The Accustick sheath was removed. The skin tract was dilated to 10  [place] and a [place] all-purpose drainage catheter was advanced over the wire and positioned with the locking loop in the renal pelvis. The tube was secured to the skin with 0 Prolene  suture and connected to gravity bag drainage. The patient tolerated the procedure well without evidence of complication.  ANESTHESIA/SEDATION: Moderate (conscious) sedation was used. 0.5 mg Versed, 25 mcg Fentanyl were administered intravenously. The patient's vital signs were monitored continuously by radiology nursing throughout the procedure. 25 mg of Benadryl was also administered intravenously an addition to 2 g Ancef within 1 hr of skin incision.  Sedation Time: 13 minutes  CONTRAST:  10 cc Omnipaque 300  FLUOROSCOPY TIME:  54 seconds  PROCEDURE: 1. Ultrasound-guided puncture of the left renal collecting system 2. Placement of a 10 French percutaneous nephrostomy tube under fluoroscopic guidance 3. Ultrasound guided puncture of the right renal collecting system 4. Placement of a 10 French percutaneous nephrostomy tube under fluoroscopic guidance Interventional Radiologist:  Criselda Peaches, MD  IMPRESSION: Successful placement of bilateral 10 French percutaneous nephrostomy tubes.  Return to interventional radiology in 4 weeks for routine bilateral tube check and change. At that time, an attempt could be made to internalize to double-J ureteral stents, or nephro ureteral stents.  Signed,  Criselda Peaches, MD  [facility]  [facility] Radiology   Electronically Signed   By: Jacqulynn Cadet M.D.   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 She is currently admitted with acute on chronic renal failure which is thought to be exacerbated by the underlying obstructed uropathy. Bilateral percutaneous nephrostomy tubes are warranted for decompression in an effort to salvage renal function. She  was anticoagulated on Coumadin at her initial presentation. She has so far received 3 units of FFP and her INR is currently 1.6. A 4th unit of FFP is currently running and will be continued throughout the procedure.  EXAM: IR ULTRASOUND GUIDANCE TISSUE ABLATION; PERC NEPHROSTOMY*L*; PERC NEPHROSTOMY*R*  Date: 03/24/2013  TECHNIQUE: Informed consent was obtained from the patient following explanation of the procedure, risks, benefits and alternatives. The patient understands, agrees and consents for the procedure. All questions were addressed. A time out was performed.  Maximal barrier sterile technique utilized including caps, mask, sterile gowns, sterile gloves, large sterile drape, hand hygiene, and Betadine skin prep.  The left flank was interrogated with ultrasound. The hydronephrotic left kidney was successfully identified. An appropriate skin entry site overlying a posterior calyx was identified and marked. Local anesthesia was attained by infiltration with 1% lidocaine. A small dermatotomy was made. Under real-time sonographic guidance, a 21 gauge Accustick needle was used to puncture a posterior interpolar calyx. Clear urine returned through the needle. An image was obtained and stored for the medical record. The 0.018 inch wire was advanced into the renal collecting system. The Accustick sheath was then advanced over the wire and positioned within the renal pelvis. Position was confirmed with a gentle hand injection of contrast material. A Rosen wire was advanced into the renal pelvis. The tract was then subsequently dilated to 10 [place] and a [place] all-purpose drain advanced over the wire and positioned with the locking loop in the renal pelvis. A final image was obtained and stored. The tube was secured to the skin with 0 Prolene suture and a plastic bumper. The tube was placed to gravity drainage. The patient tolerated the procedure well.  Attention was next turned to the right flank. The right  flank was interrogated with ultrasound. The hydronephrotic right kidney was successfully identified. An appropriate skin entry site overlying a posterior caval ex  was identified and marked. Local anesthesia was attained by infiltration with 1% lidocaine. A small dermatotomy was made. Under real-time sonographic guidance, a 21 gauge Accustick needle was used to puncture a posterior lower pole calyx. Clear urine returned through the needle. The 0.018 inch wire was advanced into the renal pelvis. The Accustick needle was exchanged for the Accustick sheath. Position within the renal collecting system was confirmed by gentle hand injection of contrast material. A J-wire was advanced through the Accustick sheath and coiled within the pelvis. The Accustick sheath was removed. The skin tract was dilated to 10 [place] and a [place] all-purpose drainage catheter was advanced over the wire and positioned with the locking loop in the renal pelvis. The tube was secured to the skin with 0 Prolene suture and connected to gravity bag drainage. The patient tolerated the procedure well without evidence of complication.  ANESTHESIA/SEDATION: Moderate (conscious) sedation was used. 0.5 mg Versed, 25 mcg Fentanyl were administered intravenously. The patient's vital signs were monitored continuously by radiology nursing throughout the procedure. 25 mg of Benadryl was also administered intravenously an addition to 2 g Ancef within 1 hr of skin incision.  Sedation Time: 13 minutes  CONTRAST:  10 cc Omnipaque 300  FLUOROSCOPY TIME:  54 seconds  PROCEDURE: 1. Ultrasound-guided puncture of the left renal collecting system 2. Placement of a 10 French percutaneous nephrostomy tube under fluoroscopic guidance 3. Ultrasound guided puncture of the right renal collecting system 4. Placement of a 10 French percutaneous nephrostomy tube under fluoroscopic guidance Interventional Radiologist:  Criselda Peaches, MD  IMPRESSION: Successful  placement of bilateral 10 French percutaneous nephrostomy tubes.  Return to interventional radiology in 4 weeks for routine bilateral tube check and change. At that time, an attempt could be made to internalize to double-J ureteral stents, or nephro ureteral stents.  Signed,  Criselda Peaches, MD  [facility]  [facility] Radiology   Electronically Signed   By: Jacqulynn Cadet M.D.   On: 03/24/2013 17:26    Scheduled Meds: . cefTRIAXone (ROCEPHIN)  IV  1 g Intravenous Q24H  . [START ON 03/27/2013] cefTRIAXone (ROCEPHIN)  IV  1 g Intravenous On Call to OR  . [START ON 03/28/2013] cefTRIAXone (ROCEPHIN)  IV  1 g Intravenous Q24H  . cloNIDine  0.1 mg Oral BID  . ezetimibe  10 mg Oral Daily  . feeding supplement (GLUCERNA SHAKE)  237 mL Oral BID BM  . ferrous sulfate  325 mg Oral Q breakfast  . insulin aspart  0-9 Units Subcutaneous Q4H  . insulin glargine  30 Units Subcutaneous QHS  . levothyroxine  112 mcg Oral QAC breakfast  . metoprolol succinate  50 mg Oral Daily   Continuous Infusions: . sodium chloride 75 mL/hr at 03/26/13 0320  . heparin 1,600 Units/hr (03/26/13 1129)    Principal Problem:   Acute-on-chronic kidney injury Active Problems:   History of pulmonary embolus (PE)   Diabetes mellitus   Hypertension   Open wound of abdominal wall without complication   Smoking   Obstructive uropathy   Bladder tumor   Bilateral hydronephrosis  Time spent: 45min  Heavenleigh Petruzzi, [facility] Pager [phone]. If 7PM-7AM, please contact night-coverage at [email or web address], password [facility] 03/26/2013, 1:17 PM  LOS: 4 days

## 2013-03-26 NOTE — Progress Notes (Signed)
ANTIBIOTIC CONSULT NOTE  Pharmacy Consult for Ancef  Indication: surgical prophylaxis on 3/9.  Allergies  Allergen Reactions  . Motrin [Ibuprofen] Rash  . Sulfa Drugs Cross Reactors Rash  . Iohexol      Code: HIVES, Desc: pt states she has dye allergy, Onset Date: [id number]   . Quinine Derivatives Rash   Microbiology: Recent Results (from the past 720 hour(s))  URINE CULTURE     Status: None   Collection Time    03/22/13  5:02 PM      Result Value Ref Range Status   Specimen Description URINE, CLEAN CATCH   Final   Special Requests NONE   Final   Culture  Setup Time     Final   Value: 03/23/2013 05:15     Performed at [facility]     Final   Value: >=100,000 COLONIES/ML     Performed at [facility]   Culture     Final   Value: ESCHERICHIA COLI     Performed at [facility]   Report Status 03/25/2013 FINAL   Final   Organism ID, Bacteria ESCHERICHIA COLI   Final  MRSA PCR SCREENING     Status: None   Collection Time    03/24/13  2:05 PM      Result Value Ref Range Status   MRSA by PCR NEGATIVE  NEGATIVE Final   Comment:            The GeneXpert MRSA Assay (FDA     approved for NASAL specimens     only), is one component of a     comprehensive MRSA colonization     surveillance program. It is not     intended to diagnose MRSA     infection nor to guide or     monitor treatment for     MRSA infections.    Medications:  Scheduled:  . cefTRIAXone (ROCEPHIN)  IV  1 g Intravenous Q24H  . cloNIDine  0.1 mg Oral BID  . ezetimibe  10 mg Oral Daily  . feeding supplement (GLUCERNA SHAKE)  237 mL Oral BID BM  . ferrous sulfate  325 mg Oral Q breakfast  . insulin aspart  0-9 Units Subcutaneous Q4H  . insulin glargine  30 Units Subcutaneous QHS  . levothyroxine  112 mcg Oral QAC breakfast  . metoprolol succinate  50 mg Oral Daily   Assessment: Pharmacy was ordered to dose Ancef on 3/9 for ppx prior to TURBT. Since then the  patient was started on Rocephin for E.coli UTI.  Goal of Therapy:  Prevention of infection.  Plan:   No Ancef. Cont Rocephin 1g IV q24h. It should be adequate ppx for TURBT.  Will change admin time on 3/9 to be within 61min of surgery.  Romeo Rabon, PharmD, pager [phone]. 03/26/2013,12:06 PM.

## 2013-03-26 NOTE — Progress Notes (Addendum)
[address] for Heparin Indication: History of DVT/PE  Allergies  Allergen Reactions  . Motrin [Ibuprofen] Rash  . Sulfa Drugs Cross Reactors Rash  . Iohexol      Code: HIVES, Desc: pt states she has dye allergy, Onset Date: [id number]   . Quinine Derivatives Rash    Patient Measurements: Height: 5\' 1"  (154.9 cm) Weight: 160 lb 0.9 oz (72.6 kg) IBW/kg (Calculated) : 47.8   Vital Signs: Temp: 98.7 F (37.1 C) (03/08 0915) Temp src: Oral (03/08 0915) BP: 134/51 mmHg (03/08 0915) Pulse Rate: 73 (03/08 0915)  Labs:  Recent Labs  03/24/13 0445 03/24/13 1407  03/25/13 0439 03/25/13 1633 03/26/13 0321 03/26/13 1033  HGB 7.5*  --   --  7.1*  --  6.7*  --   HCT 24.2*  --   --  23.7*  --  21.7*  --   PLT 401*  --   --  385  --  381  --   LABPROT 21.8* 18.7*  --   --   --   --   --   INR 1.97* 1.61*  --   --   --   --   --   HEPARINUNFRC  --   --   < > <0.10* <0.10* 0.48 0.20*  CREATININE  --  4.49*  --  4.03*  --  3.15*  --   < > = values in this interval not displayed.  Estimated Creatinine Clearance: 14.3 ml/min (by C-G formula based on Cr of 3.15).  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  Patient requiring IR guided bilateral stenting with eventual TURBT (planned for 3/9) so warfarin held, 4 units FFP given, and IV heparin infusion started for bridge.  Underwent nephrostomy tube placement 3/6 and resumed heparin 4 hours later.   Heparin level now subtherapeutic on 1400 units/hr.   H/H remains low despite 1u PRBCs last night.  By report, Right nephrostomy drainage is slightly less bloody today.  SCr slightly better.   Goal of Therapy :  Heparin level 0.3-0.7 units/ml Monitor platelets by anticoagulation protocol: Yes   Plan:   Increase heparin to 1600 units/hr.  Check heparin level 8 hrs later d/t renal  dysfunction.  Stop heparin at 2400 per Urology recommendation in preparation of surgery around midday on 3/9.  We will f/u for resumption of anticoag after TURBT on 3/9.  Romeo Rabon, PharmD, pager [phone]. 03/26/2013,11:23 AM.

## 2013-03-26 NOTE — Progress Notes (Signed)
CRITICAL VALUE ALERT  Critical value received:  Hbg 6.7   Date of notification:  03/26/2013  Time of notification:  0340  Critical value read back:yes  Nurse who received alert:  Noreene Larsson   MD notified (1st page):  M. Donnal Debar, NP   Time of first page:  0345  MD notified (2nd page):  Time of second page:  Responding MD:  M. Donnal Debar, NP  Time MD responded:  [phone]

## 2013-03-27 ENCOUNTER — Encounter (HOSPITAL_COMMUNITY): Payer: BC Managed Care – PPO | Admitting: Anesthesiology

## 2013-03-27 ENCOUNTER — Inpatient Hospital Stay (HOSPITAL_COMMUNITY): Payer: BC Managed Care – PPO | Admitting: Anesthesiology

## 2013-03-27 ENCOUNTER — Encounter (HOSPITAL_COMMUNITY): Payer: Self-pay | Admitting: Anesthesiology

## 2013-03-27 ENCOUNTER — Encounter (HOSPITAL_COMMUNITY)
Admission: EM | Disposition: A | Discharge status: Skilled Nursing Facility | Payer: Self-pay | Source: Home / Self Care | Attending: Internal Medicine

## 2013-03-27 DIAGNOSIS — N139 Obstructive and reflux uropathy, unspecified: Secondary | ICD-10-CM

## 2013-03-27 HISTORY — PX: CYSTOSCOPY/RETROGRADE/URETEROSCOPY: SHX5316

## 2013-03-27 HISTORY — PX: TRANSURETHRAL RESECTION OF BLADDER TUMOR: SHX2575

## 2013-03-27 LAB — GLUCOSE, CAPILLARY
GLUCOSE-CAPILLARY: 146 mg/dL — AB (ref 70–99)
Glucose-Capillary: 90 mg/dL (ref 70–99)
Glucose-Capillary: 64 mg/dL — ABNORMAL LOW (ref 70–99)
Glucose-Capillary: 103 mg/dL — ABNORMAL HIGH (ref 70–99)
Glucose-Capillary: 93 mg/dL (ref 70–99)
Glucose-Capillary: 94 mg/dL (ref 70–99)

## 2013-03-27 LAB — SURGICAL PCR SCREEN
MRSA, PCR: POSITIVE — AB
Staphylococcus aureus: POSITIVE — AB

## 2013-03-27 LAB — CBC
HCT: 25.4 % — ABNORMAL LOW (ref 36.0–46.0)
Hemoglobin: 8.2 g/dL — ABNORMAL LOW (ref 12.0–15.0)
MCH: 27.2 pg (ref 26.0–34.0)
MCHC: 32.3 g/dL (ref 30.0–36.0)
MCV: 84.1 fL (ref 78.0–100.0)
PLATELETS: 389 10*3/uL (ref 150–400)
RBC: 3.02 MIL/uL — ABNORMAL LOW (ref 3.87–5.11)
RDW: 15.9 % — AB (ref 11.5–15.5)
WBC: 8 10*3/uL (ref 4.0–10.5)

## 2013-03-27 LAB — TYPE AND SCREEN
ABO/RH(D): O POS
ANTIBODY SCREEN: NEGATIVE
Unit division: 0

## 2013-03-27 LAB — BASIC METABOLIC PANEL
BUN: 27 mg/dL — ABNORMAL HIGH (ref 6–23)
CO2: 20 mEq/L (ref 19–32)
Calcium: 8.3 mg/dL — ABNORMAL LOW (ref 8.4–10.5)
Chloride: 103 mEq/L (ref 96–112)
Creatinine, Ser: 1.96 mg/dL — ABNORMAL HIGH (ref 0.50–1.10)
GFR, EST AFRICAN AMERICAN: 28 mL/min — AB (ref >90)
GFR, EST NON AFRICAN AMERICAN: 24 mL/min — AB (ref >90)
Glucose, Bld: 66 mg/dL — ABNORMAL LOW (ref 70–99)
POTASSIUM: 3.7 meq/L (ref 3.7–5.3)
Sodium: 138 mEq/L (ref 137–147)

## 2013-03-27 SURGERY — TURBT (TRANSURETHRAL RESECTION OF BLADDER TUMOR)
Anesthesia: General | Site: Ureter

## 2013-03-27 MED ORDER — FENTANYL CITRATE 0.05 MG/ML IJ SOLN
INTRAMUSCULAR | Status: DC | PRN
  Administered 2013-03-27 (×4): 25 ug via INTRAVENOUS

## 2013-03-27 MED ORDER — DEXTROSE 5 % IV SOLN
1.0000 g | INTRAVENOUS | Status: AC
  Administered 2013-03-27: 1 g via INTRAVENOUS
  Filled 2013-03-27: qty 10

## 2013-03-27 MED ORDER — LACTATED RINGERS IV SOLN
INTRAVENOUS | Status: DC | PRN
  Administered 2013-03-27: 13:00:00 via INTRAVENOUS

## 2013-03-27 MED ORDER — STERILE WATER FOR IRRIGATION IR SOLN
Status: DC | PRN
  Administered 2013-03-27: 5000 mL

## 2013-03-27 MED ORDER — FENTANYL CITRATE 0.05 MG/ML IJ SOLN
INTRAMUSCULAR | Status: AC
  Filled 2013-03-27: qty 2

## 2013-03-27 MED ORDER — ONDANSETRON HCL 4 MG/2ML IJ SOLN
INTRAMUSCULAR | Status: AC
  Filled 2013-03-27: qty 2

## 2013-03-27 MED ORDER — ONDANSETRON HCL 4 MG/2ML IJ SOLN
INTRAMUSCULAR | Status: DC | PRN
  Administered 2013-03-27: 4 mg via INTRAVENOUS

## 2013-03-27 MED ORDER — PROMETHAZINE HCL 25 MG/ML IJ SOLN: 6.2500 mg | INTRAMUSCULAR | Status: DC | PRN

## 2013-03-27 MED ORDER — HYDROMORPHONE HCL PF 1 MG/ML IJ SOLN
INTRAMUSCULAR | Status: AC
  Filled 2013-03-27: qty 1

## 2013-03-27 MED ORDER — HYDROMORPHONE HCL PF 1 MG/ML IJ SOLN
0.2500 mg | INTRAMUSCULAR | Status: DC | PRN
  Administered 2013-03-27 (×2): 0.5 mg via INTRAVENOUS

## 2013-03-27 MED ORDER — PROPOFOL 10 MG/ML IV BOLUS
INTRAVENOUS | Status: AC
  Filled 2013-03-27: qty 20

## 2013-03-27 MED ORDER — IOHEXOL 300 MG/ML  SOLN
INTRAMUSCULAR | Status: DC | PRN
  Administered 2013-03-27: 8 mL via URETHRAL

## 2013-03-27 MED ORDER — LACTATED RINGERS IV SOLN: INTRAVENOUS | Status: DC

## 2013-03-27 MED ORDER — DEXTROSE 50 % IV SOLN
INTRAVENOUS | Status: AC
  Administered 2013-03-27: 50 mL
  Filled 2013-03-27: qty 50

## 2013-03-27 MED ORDER — PROPOFOL 10 MG/ML IV BOLUS
INTRAVENOUS | Status: DC | PRN
  Administered 2013-03-27: 100 mg via INTRAVENOUS

## 2013-03-27 SURGICAL SUPPLY — 28 items
BAG URINE DRAINAGE (UROLOGICAL SUPPLIES) ×2 IMPLANT
BAG URO CATCHER STRL LF (DRAPE) ×4 IMPLANT
BASKET ZERO TIP NITINOL 2.4FR (BASKET) IMPLANT
BRUSH URET BIOPSY 3F (UROLOGICAL SUPPLIES) ×2 IMPLANT
BSKT STON RTRVL ZERO TP 2.4FR (BASKET)
CATH INTERMIT  6FR 70CM (CATHETERS) ×2 IMPLANT
CATH SILICONE 22FR 30CC 3WAY (CATHETERS) ×2 IMPLANT
CATH URET 5FR 28IN CONE TIP (BALLOONS)
CATH URET 5FR 70CM CONE TIP (BALLOONS) IMPLANT
DRAPE CAMERA CLOSED 9X96 (DRAPES) ×4 IMPLANT
ELECT REM PT RETURN 9FT ADLT (ELECTROSURGICAL) ×4
ELECTRODE REM PT RTRN 9FT ADLT (ELECTROSURGICAL) ×2 IMPLANT
EVACUATOR MICROVAS BLADDER (UROLOGICAL SUPPLIES) IMPLANT
GLOVE BIOGEL M 8.0 STRL (GLOVE) ×10 IMPLANT
GOWN STRL REUS W/ TWL XL LVL3 (GOWN DISPOSABLE) ×2 IMPLANT
GOWN STRL REUS W/TWL XL LVL3 (GOWN DISPOSABLE) ×8 IMPLANT
GUIDEWIRE STR DUAL SENSOR (WIRE) ×2 IMPLANT
GUIDEWIRE SUPER STIFF (WIRE) ×2 IMPLANT
KIT ASPIRATION TUBING (SET/KITS/TRAYS/PACK) IMPLANT
LOOPS RESECTOSCOPE DISP (ELECTROSURGICAL) ×4 IMPLANT
MANIFOLD NEPTUNE II (INSTRUMENTS) ×4 IMPLANT
PACK CYSTO (CUSTOM PROCEDURE TRAY) ×4 IMPLANT
SHEATH URET ACCESS 12FR/35CM (UROLOGICAL SUPPLIES) ×2 IMPLANT
SYRINGE IRR TOOMEY STRL 70CC (SYRINGE) IMPLANT
TUBING CONNECTING 10 (TUBING) ×3 IMPLANT
TUBING CONNECTING 10' (TUBING) ×1
WATER STERILE IRR 3000ML UROMA (IV SOLUTION) ×4 IMPLANT
WIRE COONS/BENSON .038X145CM (WIRE) ×2 IMPLANT

## 2013-03-27 NOTE — Progress Notes (Signed)
[facility] PROGRESS NOTE  Heather [name] VOZ:[id number] DOB: August 22, 1940 DOA: 03/22/2013 PCP: Rogene [place], MD  Assessment/Plan: 1. 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  - Urology following - for TURBT today - s/p nephrostomy tube placement with steady improvement in renal function - Cont to follow 2. Diabetes mellitus  Stable. Cont current regimen 3. Hypertension, chronic diastolic dysfunction  - Holding Lasix and reduced the Toprol to 50 daily at present, continue clonidine.  4. Active smoking, bilateral wheezing  -cont duonebs PRN 5. Hypothyroidism  Continue Synthroid as tolerated 6. Acute Blood Loss Anemia - Likely secondary to blood loss via nephrostomy tubes while on anticoagulation - Improved with PRBC transfusion 7.  Hx of PE and DVT in the setting of colon cancer - PE and DVT noted since at least 2008 - Had been maintained on chronic anticoagulation - If blood loss persists or worsens, consider holding anticoagulation - Hgb stable thus far  Code Status: DNR Family Communication: Pt and family in room (indicate person spoken with, relationship, and if by phone, the number) Disposition Plan: Pending  Consultants:  Urology  Procedures:    Antibiotics:    HPI/Subjective: No complaints. Reports swelling markedly improved this AM  Objective: Filed Vitals:   03/26/13 2120 03/26/13 2300 03/27/13 0500 03/27/13 0848  BP: 133/48  106/54   Pulse: 74  68   Temp: 100.1 F (37.8 C) 98.7 F (37.1 C) 98.8 F (37.1 C)   TempSrc: Oral Oral Oral   Resp: 16  16   Height:      Weight:      SpO2: 93% 98% 98% 91%    Intake/Output Summary (Last 24 hours) at 03/27/13 1205 Last data filed at 03/27/13 1021  Gross per 24 hour  Intake 2185.03 ml  Output   1530 ml  Net 655.03 ml   Filed Weights   03/22/13 2319 03/25/13 0500 03/26/13  0557  Weight: 66.5 kg (146 lb 9.7 oz) 71.759 kg (158 lb 3.2 oz) 72.6 kg (160 lb 0.9 oz)    Exam:  General:  Awake, in nad  Cardiovascular: regular, s1, s2  Respiratory: normal resp effort, no wheezing  Abdomen: soft, nondistended  Musculoskeletal: perfused, no clubbing   Data Reviewed: Basic Metabolic Panel:  Recent Labs Lab 03/23/13 0530 03/24/13 1407 03/25/13 0439 03/26/13 0321 03/27/13 0420  NA 136* 136* 137 138 138  K 5.5* 5.4* 4.8 4.1 3.7  CL 102 101 103 105 103  CO2 22 22 22 20 20   GLUCOSE 133* 89 150* 126* 66*  BUN 55* 54* 52* 41* 27*  CREATININE 3.79* 4.49* 4.03* 3.15* 1.96*  CALCIUM 8.6 9.0 8.4 8.4 8.3*   Liver Function Tests:  Recent Labs Lab 03/22/13 1803 03/23/13 0530  AST 23 17  ALT 10 7  ALKPHOS 244* 185*  BILITOT <0.2* <0.2*  PROT 8.5* 6.5  ALBUMIN 3.0* 2.2*   No results found for this basename: LIPASE, AMYLASE,  in the last 168 hours No results found for this basename: AMMONIA,  in the last 168 hours CBC:  Recent Labs Lab 03/22/13 1650  03/23/13 0530 03/24/13 0445 03/25/13 0439 03/26/13 0321 03/26/13 1454 03/26/13 1811 03/26/13 2144 03/27/13 0420  WBC 9.9  --  9.2 8.9 8.9 8.6  --   --   --  8.0  NEUTROABS 6.5  --   --   --   --   --   --   --   --   --  HGB 10.0*  < > 7.6* 7.5* 7.1* 6.7* 8.7* 8.5* 8.4* 8.2*  HCT 31.2*  < > 25.9* 24.2* 23.7* 21.7* 26.8* 26.5* 26.7* 25.4*  MCV 85.0  --  86.9 86.1 87.1 85.4  --   --   --  84.1  PLT 286  --  419* 401* 385 381  --   --   --  389  < > = values in this interval not displayed. Cardiac Enzymes: No results found for this basename: CKTOTAL, CKMB, CKMBINDEX, TROPONINI,  in the last 168 hours BNP (last 3 results)  Recent Labs  03/03/13 2334  PROBNP 731.0*   CBG:  Recent Labs Lab 03/26/13 1712 03/26/13 1935 03/27/13 0008 03/27/13 0351 03/27/13 0747  GLUCAP 167* 180* 146* 90 64*    Recent Results (from the past 240 hour(s))  URINE CULTURE     Status: None   Collection Time     03/22/13  5:02 PM      Result Value Ref Range Status   Specimen Description URINE, CLEAN CATCH   Final   Special Requests NONE   Final   Culture  Setup Time     Final   Value: 03/23/2013 05:15     Performed at [facility]     Final   Value: >=100,000 COLONIES/ML     Performed at [facility]   Culture     Final   Value: ESCHERICHIA COLI     Performed at [facility]   Report Status 03/25/2013 FINAL   Final   Organism ID, Bacteria ESCHERICHIA COLI   Final  MRSA PCR SCREENING     Status: None   Collection Time    03/24/13  2:05 PM      Result Value Ref Range Status   MRSA by PCR NEGATIVE  NEGATIVE Final   Comment:            The GeneXpert MRSA Assay (FDA     approved for NASAL specimens     only), is one component of a     comprehensive MRSA colonization     surveillance program. It is not     intended to diagnose MRSA     infection nor to guide or     monitor treatment for     MRSA infections.  SURGICAL PCR SCREEN     Status: Abnormal   Collection Time    03/27/13  3:52 AM      Result Value Ref Range Status   MRSA, PCR POSITIVE (*) NEGATIVE Final   Comment: RESULT CALLED TO, READ BACK BY AND VERIFIED WITH:     A. RODRIGUIZ RN AT [id number] ON 03.09.15 BY SHUEA   Staphylococcus aureus POSITIVE (*) NEGATIVE Final   Comment:            The Xpert SA Assay (FDA     approved for NASAL specimens     in patients over 8 years of age),     is one component of     a comprehensive surveillance     program.  Test performance has     been validated by [employer] for patients greater     than or equal to 74 year old.     It is not intended     to diagnose infection nor to     guide or monitor treatment.     Studies: No results found.  Scheduled Meds: . [  START ON 03/28/2013] cefTRIAXone (ROCEPHIN)  IV  1 g Intravenous Q24H  . cefTRIAXone (ROCEPHIN)  IV  1 g Intravenous On Call to OR  . cloNIDine  0.1 mg Oral BID  . ezetimibe  10 mg  Oral Daily  . feeding supplement (GLUCERNA SHAKE)  237 mL Oral BID BM  . ferrous sulfate  325 mg Oral Q breakfast  . insulin aspart  0-9 Units Subcutaneous Q4H  . insulin glargine  30 Units Subcutaneous QHS  . levothyroxine  112 mcg Oral QAC breakfast  . metoprolol succinate  50 mg Oral Daily   Continuous Infusions: . sodium chloride 75 mL/hr at 03/26/13 2022    Principal Problem:   Acute-on-chronic kidney injury Active Problems:   History of pulmonary embolus (PE)   Diabetes mellitus   Hypertension   Open wound of abdominal wall without complication   Smoking   Obstructive uropathy   Bladder tumor   Bilateral hydronephrosis  Time spent: 9min  Lorieann Argueta, [facility] Pager [phone]. If 7PM-7AM, please contact night-coverage at [email or web address], password [facility] 03/27/2013, 12:05 PM  LOS: 5 days

## 2013-03-27 NOTE — Preoperative (Addendum)
Beta Blockers   Reason not to administer Beta Blockers:Not Applicable 6256 am

## 2013-03-27 NOTE — Transfer of Care (Signed)
Immediate Anesthesia Transfer of Care Note  Patient: Heather [name]  Procedure(s) Performed: Procedure(s): TRANSURETHRAL RESECTION OF BLADDER TUMOR (TURBT) (N/A) CYSTOSCOPY RETROGRADE PYELOGRAM POSSIBLE URETEROSCOPY (N/A)  Patient Location: PACU  Anesthesia Type:General  Level of Consciousness: awake, alert , oriented and patient cooperative  Airway & Oxygen Therapy: Patient Spontanous Breathing and Patient connected to face mask oxygen  Post-op Assessment: Report given to PACU RN and Post -op Vital signs reviewed and stable  Post vital signs: Reviewed and stable  Complications: No apparent anesthesia complications

## 2013-03-27 NOTE — Progress Notes (Signed)
Inpatient Diabetes Program Recommendations  AACE/ADA: New Consensus Statement on Inpatient Glycemic Control (2013)  Target Ranges:  Prepandial:   less than 140 mg/dL      Peak postprandial:   less than 180 mg/dL (1-2 hours)      Critically ill patients:  140 - 180 mg/dL   Reason for Visit: Hypoglycemia  Diabetes history: Type 2 DM Outpatient Diabetes medications: Lantus 30 units QHS and Humalog S/S Current orders for Inpatient glycemic control: Lantus 30 units QHS and Novolog sensitive Q4H  Results for VERENISE, MOULIN (MRN [id number]) as of 03/27/2013 12:01  Ref. Range 03/26/2013 00:31 03/26/2013 03:26 03/26/2013 08:14 03/26/2013 12:01 03/26/2013 17:12 03/26/2013 19:35 03/27/2013 00:08 03/27/2013 03:51 03/27/2013 07:47  Glucose-Capillary Latest Range: 70-99 mg/dL 139 (H) 97 98 115 (H) 167 (H) 180 (H) 146 (H) 90 64 (L)   Hypoglycemia likely d/t Lantus dose.  Recommendations: Decrease Lantus to 20 units QHS. When po intake begins, change Novolog to sensitive tidwc.  Will continue to follow. Thank you. Lorenda Peck, RD, LDN, CDE Inpatient Diabetes Coordinator [phone]

## 2013-03-27 NOTE — Op Note (Signed)
Preoperative diagnosis: 1. Urothelial carcinoma the bladder, left-sided bladder wall tumor 2. Bilateral hydronephrosis  Postoperative diagnosis: Same  Procedure: Cystoscopy, TURBT of 2.5 cm bladder tumor, bilateral retrograde ureteropyelograms , right ureteroscopy, brush biopsy of right ureter  Surgeon: Lillette Boxer. Tyreek Clabo, M.D.   Anesthesia: Gen.   Complications: None  Specimen(s): 1. Brush biopsy of right ureter 2. Bladder tumor  Drain(s): 67 French Foley catheter to three-way irrigation  Indications: 73 year old female who presented to my attention in [place] last week with a bladder tumor as well as right greater than left hydronephrosis. 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 She has had nephrostomy tubes placed. She presents at this time for diagnostic excision of her left bladder tumor, as well as retrogrades and possible ureteroscopy. The procedure, risks and complications have been discussed with the patient. She understands these and desires to proceed.   Technique and findings: The patient was properly identified in the holding area and has been on IV antibiotics. She was taken the operating room where general anesthetic was administered with the LMA. She was placed in the dorsolithotomy position. Genitalia and perineum were prepped and draped, time out was performed.  I then passed a 22 [place] panendoscope. Purulent urine was obtained. The bladder was irrigated, and then systematic inspection of the bladder was performed with both the 12 and 70 lenses. The right ureteral orifice was normal and with normal configuration and location. The left ureteral orifice was obscured by a papillary tumor, approximately 2-1/2 cm long, extending just inferior to the region where the orifice would be, superolaterally. There were some calcifications on the bladder tumor. I did  not see any other urothelial abnormalities upon inspection. There were no bladder stones. I then used a 6 [place] open-ended catheter to perform a right retrograde ureteropyelogram. This was used with the assistance of an angled tip guidewire, as there was a small submucosal injury to the bladder by trying to admit just the open-ended catheter. Retrograde was performed using Omnipaque.  The retrograde revealed a normal distal and mid ureter. At approximately the level of L4, there was a significant narrowing and tortuosity of the ureter. I did not see any filling defects. Contrast did get up to the renal pelvis which at this point, as the patient had a nephrostomy tube, was decompressed. I saw no filling defects there.  At this point, I negotiated the guidewire up through the strictured area into the right renal pelvis. I then removed the cystoscope, and passed a 6 French ureteroscope, following dilation of the ureteral orifice with the inner core of a ureteral access sheath. The distal and proximal mid ureter were normal. Above this, there was significant narrowing and I was unable to admit the ureteroscope any farther. I did not see any urothelial abnormalities, however. I passed a ureteral brush up into this area, and passed it proximally and distally several times. The brush was then removed and sent for cytology. I did not leave a stent in at this point, as the patient had a nephrostomy tube.  After inspection of the left trigonal area, I was unable to identify the left ureteral orifice. I did probe with an open-ended catheter and the guidewire without success in cannulating. At this point, as the patient did have a nephrostomy tube on the left as well, I did not proceed further with trying to perform a retrograde or  ureteroscopy. The scope was then removed and replaced with the resectoscope sheath. I then passed a resectoscope and the cutting loop and resected the bladder tumor down to the muscle layer.  Despite this, I still did not see the ureteral orifice. The base of the bladder tumor was cauterized. There was no bleeding at this point. The bladder tumor fragments were sent labeled "bladder tumor". At this point, it placed a 22 French three-way Foley catheter and hooked it to CBI with saline. The procedure was terminated. The patient was awakened and taken to the PACU in stable condition.

## 2013-03-27 NOTE — Anesthesia Preprocedure Evaluation (Addendum)
Anesthesia Evaluation  Patient identified by MRN, date of birth, ID band Patient awake    Reviewed: Allergy & Precautions, H&P , NPO status , Patient's Chart, lab work & pertinent test results  Airway Mallampati: II TM Distance: >3 FB Neck ROM: Full    Dental  (+) Dental Advisory Given, Poor Dentition Mostly missing and broken teeth.:   Pulmonary pneumonia -, resolved, COPD COPD inhaler, Current Smoker,  breath sounds clear to auscultation  Pulmonary exam normal       Cardiovascular hypertension, Pt. on medications and Pt. on home beta blockers + Peripheral Vascular Disease negative cardio ROS  Rhythm:Regular Rate:Normal     Neuro/Psych negative neurological ROS  negative psych ROS   GI/Hepatic negative GI ROS, Neg liver ROS,   Endo/Other  diabetes, Type 1, Insulin DependentHypothyroidism   Renal/GU Renal InsufficiencyRenal disease  negative genitourinary   Musculoskeletal negative musculoskeletal ROS (+)   Abdominal   Peds negative pediatric ROS (+)  Hematology  (+) anemia ,   Anesthesia Other Findings   Reproductive/Obstetrics negative OB ROS                          Anesthesia Physical Anesthesia Plan  ASA: III  Anesthesia Plan: General   Post-op Pain Management:    Induction: Intravenous  Airway Management Planned: LMA  Additional Equipment:   Intra-op Plan:   Post-operative Plan: Extubation in OR  Informed Consent: I have reviewed the patients History and Physical, chart, labs and discussed the procedure including the risks, benefits and alternatives for the proposed anesthesia with the patient or authorized representative who has indicated his/her understanding and acceptance.   Dental advisory given  Plan Discussed with: CRNA  Anesthesia Plan Comments:         Anesthesia Quick Evaluation

## 2013-03-28 ENCOUNTER — Encounter (HOSPITAL_COMMUNITY): Payer: Self-pay | Admitting: Urology

## 2013-03-28 DIAGNOSIS — C189 Malignant neoplasm of colon, unspecified: Secondary | ICD-10-CM

## 2013-03-28 LAB — BASIC METABOLIC PANEL
BUN: 27 mg/dL — AB (ref 6–23)
CHLORIDE: 104 meq/L (ref 96–112)
CO2: 19 meq/L (ref 19–32)
CREATININE: 2.2 mg/dL — AB (ref 0.50–1.10)
Calcium: 9 mg/dL (ref 8.4–10.5)
GFR calc Af Amer: 24 mL/min — ABNORMAL LOW (ref >90)
GFR calc non Af Amer: 21 mL/min — ABNORMAL LOW (ref >90)
GLUCOSE: 120 mg/dL — AB (ref 70–99)
Potassium: 4 mEq/L (ref 3.7–5.3)
Sodium: 140 mEq/L (ref 137–147)

## 2013-03-28 LAB — GLUCOSE, CAPILLARY
GLUCOSE-CAPILLARY: 95 mg/dL (ref 70–99)
GLUCOSE-CAPILLARY: 163 mg/dL — AB (ref 70–99)
Glucose-Capillary: 104 mg/dL — ABNORMAL HIGH (ref 70–99)
Glucose-Capillary: 131 mg/dL — ABNORMAL HIGH (ref 70–99)
Glucose-Capillary: 156 mg/dL — ABNORMAL HIGH (ref 70–99)

## 2013-03-28 LAB — CBC
HEMATOCRIT: 29.7 % — AB (ref 36.0–46.0)
HEMOGLOBIN: 9.2 g/dL — AB (ref 12.0–15.0)
MCH: 26.7 pg (ref 26.0–34.0)
MCHC: 31 g/dL (ref 30.0–36.0)
MCV: 86.3 fL (ref 78.0–100.0)
PLATELETS: 372 10*3/uL (ref 150–400)
RBC: 3.44 MIL/uL — AB (ref 3.87–5.11)
RDW: 16.3 % — ABNORMAL HIGH (ref 11.5–15.5)
WBC: 10.7 10*3/uL — AB (ref 4.0–10.5)

## 2013-03-28 LAB — PROTIME-INR
INR: 1.27 (ref 0.00–1.49)
Prothrombin Time: 15.6 seconds — ABNORMAL HIGH (ref 11.6–15.2)

## 2013-03-28 MED ORDER — HEPARIN (PORCINE) IN NACL 100-0.45 UNIT/ML-% IJ SOLN
1000.0000 [IU]/h | INTRAMUSCULAR | Status: DC
  Administered 2013-03-28: 1000 [IU]/h via INTRAVENOUS
  Filled 2013-03-28: qty 250

## 2013-03-28 MED ORDER — WARFARIN - PHARMACIST DOSING INPATIENT
Freq: Every day | Status: DC
  Administered 2013-03-30: 18:00:00

## 2013-03-28 MED ORDER — INSULIN ASPART 100 UNIT/ML ~~LOC~~ SOLN
0.0000 [IU] | Freq: Three times a day (TID) | SUBCUTANEOUS | Status: DC
  Administered 2013-03-29 (×2): 2 [IU] via SUBCUTANEOUS
  Administered 2013-03-30 (×2): 1 [IU] via SUBCUTANEOUS
  Administered 2013-03-31: 2 [IU] via SUBCUTANEOUS
  Administered 2013-03-31 – 2013-04-01 (×2): 1 [IU] via SUBCUTANEOUS
  Administered 2013-04-01: 2 [IU] via SUBCUTANEOUS
  Administered 2013-04-01: 1 [IU] via SUBCUTANEOUS
  Administered 2013-04-02 – 2013-04-03 (×3): 2 [IU] via SUBCUTANEOUS

## 2013-03-28 MED ORDER — WARFARIN SODIUM 4 MG PO TABS
4.0000 mg | ORAL_TABLET | Freq: Once | ORAL | Status: AC
  Administered 2013-03-28: 4 mg via ORAL
  Filled 2013-03-28: qty 1

## 2013-03-28 MED ORDER — HEPARIN (PORCINE) IN NACL 100-0.45 UNIT/ML-% IJ SOLN
1000.0000 [IU]/h | INTRAMUSCULAR | Status: DC
  Filled 2013-03-28: qty 250

## 2013-03-28 NOTE — Progress Notes (Signed)
Right and left urostomies flushed per PA order.  Right and left urostomy dressing changes done at 1200 noon.   Sites looked clean and dry, scant serosanguineous drainage present on both surgical dressings

## 2013-03-28 NOTE — Progress Notes (Signed)
I have ordered the catheter to be discontinued. If patient voids ok and is stable/improved enough from medical standpoint to be sent home, that is OK with me. I discussed the need for going home with both percutaneous tubes, at least temporarily. I will eventually see if she can be scheduled for internalization of bilateral stents.  She still had pyuria at the time of her cysto yesterday. I would recommend starting her on oral antibiotic therapy tailores to her ARI to continue another 10 days.  I will set up an appointment for urologic followup. She will need percutaneous bx of her retroperitoneal mass.  Call me for any questions  Lillette Boxer. Diona Fanti, [place]

## 2013-03-28 NOTE — Progress Notes (Signed)
Pt transferred to 1428, assumed care of pt at this time.  Resting comfortably in bed with no current complaints.  Coolidge Breeze, RN 03/28/2013

## 2013-03-28 NOTE — Progress Notes (Signed)
Pt has not voided since Foley d/c'd this AM.  Assisted pt to [facility], unable to void.  Bladder scan performed - 69ml urine visualized.  Nephrostomy tubes patent and draining, emptied.  R nephrostomy had 6ml, L nephrostomy had 63ml.  Text-paged Dr. Wyline Copas, will await further orders.  Coolidge Breeze, RN 03/28/2013

## 2013-03-28 NOTE — Progress Notes (Addendum)
[facility] PROGRESS NOTE  Heather [name] [id number] DOB: 1941-01-15 DOA: 03/22/2013 PCP: Rogene Houston, MD Off Service Summary [phone] who presents with B hydronephrosis. 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 INR was reversed and pt underwent B nephrostomy tube placement by IR. Pt then underwent TURBT by Urology on 03/27/13. OK to resume full dose anticoagulation per Urology. Pt's coumadin has been resumed 3/10 with heparin bridge. Currently on rocephin for cipro-resistant ecoli UTI. Renal fx had been improving nicely since nephrostomy tube placement, however there was a slight increase in Cr on 3/10.  Assessment/Plan: 1. 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  - Urology following - Pt is s/p TURBT 3/9 - s/p nephrostomy tube placement with initial good improvement in renal fx - Cr is elevated today to 2.2 - Cont to monitor renal fx and avoid nephrotoxins 2. Diabetes mellitus  Stable. Cont current regimen 3. Hypertension, chronic diastolic dysfunction  - Holding Lasix and reduced the Toprol to 50 daily at present, continue clonidine. - BP well controlled  4. Active smoking, bilateral wheezing  -cont duonebs PRN 5. Hypothyroidism  Continue Synthroid as tolerated 6. Acute Blood Loss Anemia - Likely secondary to blood loss via nephrostomy tubes while on anticoagulation - Improved with recent PRBC transfusion 7.  Hx of PE and DVT in the setting of colon cancer - PE and DVT noted since at least 2008 - Had been maintained on chronic anticoagulation - If blood loss persists or worsens, consider holding anticoagulation - Hgb stable thus far - INR was reversed for below procedures - Consider resuming coumadin w/ heparin bridge if OK with Urology 8.  Multi-drug sensitive UTI - On empiric rocephin - Consider continuing rocephin and completing course  w/ renally dosed cipro on discharge  Code Status: DNR Family Communication: Pt and family in room (indicate person spoken with, relationship, and if by phone, the number) Disposition Plan: Pending  Consultants:  Urology  Procedures:  B nephrostomy tube placement per IR 03/24/13  TURBT 03/27/13  Antibiotics:  Rocephin 03/27/13>>>  HPI/Subjective: No acute events noted overnight.  Objective: Filed Vitals:   03/27/13 1715 03/27/13 2023 03/28/13 0426 03/28/13 1000  BP: 98/55 115/53 110/58   Pulse: 75 88 84 60  Temp: 98.6 F (37 C) 98.5 F (36.9 C) 99.2 F (37.3 C)   TempSrc: Oral Oral Oral   Resp:  16 16   Height:      Weight:      SpO2: 98% 96% 98%     Intake/Output Summary (Last 24 hours) at 03/28/13 1137 Last data filed at 03/28/13 1101  Gross per 24 hour  Intake   2290 ml  Output   5505 ml  Net  -3215 ml   Filed Weights   03/22/13 2319 03/25/13 0500 03/26/13 0557  Weight: 66.5 kg (146 lb 9.7 oz) 71.759 kg (158 lb 3.2 oz) 72.6 kg (160 lb 0.9 oz)    Exam:  General:  Awake, in nad  Cardiovascular: regular, s1, s2  Respiratory: normal resp effort, no wheezing  Abdomen: soft, nondistended  Musculoskeletal: perfused, no clubbing   Data Reviewed: Basic Metabolic Panel:  Recent Labs Lab 03/24/13 1407 03/25/13 0439 03/26/13 0321 03/27/13 0420 03/28/13 0455  NA 136* 137 138 138 140  K 5.4* 4.8 4.1 3.7 4.0  CL 101 103 105 103 104  CO2 22 22 20 20 19   GLUCOSE  89 150* 126* 66* 120*  BUN 54* 52* 41* 27* 27*  CREATININE 4.49* 4.03* 3.15* 1.96* 2.20*  CALCIUM 9.0 8.4 8.4 8.3* 9.0   Liver Function Tests:  Recent Labs Lab 03/22/13 1803 03/23/13 0530  AST 23 17  ALT 10 7  ALKPHOS 244* 185*  BILITOT <0.2* <0.2*  PROT 8.5* 6.5  ALBUMIN 3.0* 2.2*   No results found for this basename: LIPASE, AMYLASE,  in the last 168 hours No results found for this basename: AMMONIA,  in the last 168 hours CBC:  Recent Labs Lab 03/22/13 1650  03/24/13 0445  03/25/13 0439 03/26/13 0321 03/26/13 1454 03/26/13 1811 03/26/13 2144 03/27/13 0420 03/28/13 0455  WBC 9.9  < > 8.9 8.9 8.6  --   --   --  8.0 10.7*  NEUTROABS 6.5  --   --   --   --   --   --   --   --   --   HGB 10.0*  < > 7.5* 7.1* 6.7* 8.7* 8.5* 8.4* 8.2* 9.2*  HCT 31.2*  < > 24.2* 23.7* 21.7* 26.8* 26.5* 26.7* 25.4* 29.7*  MCV 85.0  < > 86.1 87.1 85.4  --   --   --  84.1 86.3  PLT 286  < > 401* 385 381  --   --   --  389 372  < > = values in this interval not displayed. Cardiac Enzymes: No results found for this basename: CKTOTAL, CKMB, CKMBINDEX, TROPONINI,  in the last 168 hours BNP (last 3 results)  Recent Labs  03/03/13 2334  PROBNP 731.0*   CBG:  Recent Labs Lab 03/27/13 1455 03/27/13 2043 03/28/13 0005 03/28/13 0429 03/28/13 0741  GLUCAP 93 94 104* 95 131*    Recent Results (from the past 240 hour(s))  URINE CULTURE     Status: None   Collection Time    03/22/13  5:02 PM      Result Value Ref Range Status   Specimen Description URINE, CLEAN CATCH   Final   Special Requests NONE   Final   Culture  Setup Time     Final   Value: 03/23/2013 05:15     Performed at [facility]     Final   Value: >=100,000 COLONIES/ML     Performed at [facility]   Culture     Final   Value: ESCHERICHIA COLI     Performed at [facility]   Report Status 03/25/2013 FINAL   Final   Organism ID, Bacteria ESCHERICHIA COLI   Final  MRSA PCR SCREENING     Status: None   Collection Time    03/24/13  2:05 PM      Result Value Ref Range Status   MRSA by PCR NEGATIVE  NEGATIVE Final   Comment:            The GeneXpert MRSA Assay (FDA     approved for NASAL specimens     only), is one component of a     comprehensive MRSA colonization     surveillance program. It is not     intended to diagnose MRSA     infection nor to guide or     monitor treatment for     MRSA infections.  SURGICAL PCR SCREEN     Status: Abnormal   Collection  Time    03/27/13  3:52 AM      Result Value Ref  Range Status   MRSA, PCR POSITIVE (*) NEGATIVE Final   Comment: RESULT CALLED TO, READ BACK BY AND VERIFIED WITH:     A. RODRIGUIZ RN AT 8101 ON 03.09.15 BY SHUEA   Staphylococcus aureus POSITIVE (*) NEGATIVE Final   Comment:            The Xpert SA Assay (FDA     approved for NASAL specimens     in patients over 59 years of age),     is one component of     a comprehensive surveillance     program.  Test performance has     been validated by [employer] for patients greater     than or equal to 49 year old.     It is not intended     to diagnose infection nor to     guide or monitor treatment.     Studies: No results found.  Scheduled Meds: . cefTRIAXone (ROCEPHIN)  IV  1 g Intravenous Q24H  . cloNIDine  0.1 mg Oral BID  . ezetimibe  10 mg Oral Daily  . feeding supplement (GLUCERNA SHAKE)  237 mL Oral BID BM  . ferrous sulfate  325 mg Oral Q breakfast  . insulin aspart  0-9 Units Subcutaneous Q4H  . insulin glargine  30 Units Subcutaneous QHS  . levothyroxine  112 mcg Oral QAC breakfast  . metoprolol succinate  50 mg Oral Daily   Continuous Infusions: . sodium chloride 75 mL/hr at 03/26/13 2022  . lactated ringers      Principal Problem:   Acute-on-chronic kidney injury Active Problems:   History of pulmonary embolus (PE)   Diabetes mellitus   Hypertension   Open wound of abdominal wall without complication   Smoking   Obstructive uropathy   Bladder tumor   Bilateral hydronephrosis  Time spent: 23min  Tiffiany Beadles, [facility] Pager [phone]. If 7PM-7AM, please contact night-coverage at [email or web address], password [facility] 03/28/2013, 11:37 AM  LOS: 6 days

## 2013-03-28 NOTE — Progress Notes (Signed)
Report called to RN on 4W. All questions answered. Patient transferred with RN, NT and family

## 2013-03-28 NOTE — Progress Notes (Addendum)
ANTICOAGULATION CONSULT NOTE - Follow Up Consult  Pharmacy Consult for heparin, warfarin Indication: hx of VTE  Allergies  Allergen Reactions  . Motrin [Ibuprofen] Rash  . Sulfa Drugs Cross Reactors Rash  . Iohexol      Code: HIVES, Desc: pt states she has dye allergy, Onset Date: [id number]   . Quinine Derivatives Rash    Patient Measurements: Height: 5\' 1"  (154.9 cm) Weight: 160 lb 0.9 oz (72.6 kg) IBW/kg (Calculated) : 47.8   Vital Signs: Temp: 99.2 F (37.3 C) (03/10 0426) Temp src: Oral (03/10 0426) BP: 110/58 mmHg (03/10 0426) Pulse Rate: 60 (03/10 1000)  Labs:  Recent Labs  03/26/13 0321 03/26/13 1033  03/26/13 2004 03/26/13 2144 03/27/13 0420 03/28/13 0455  HGB 6.7*  --   < >  --  8.4* 8.2* 9.2*  HCT 21.7*  --   < >  --  26.7* 25.4* 29.7*  PLT 381  --   --   --   --  389 372  HEPARINUNFRC 0.48 0.20*  --  0.41  --   --   --   CREATININE 3.15*  --   --   --   --  1.96* 2.20*  < > = values in this interval not displayed.  Estimated Creatinine Clearance: 20.4 ml/min (by C-G formula based on Cr of 2.2).   Medications:  Scheduled:  . cefTRIAXone (ROCEPHIN)  IV  1 g Intravenous Q24H  . cloNIDine  0.1 mg Oral BID  . ezetimibe  10 mg Oral Daily  . feeding supplement (GLUCERNA SHAKE)  237 mL Oral BID BM  . ferrous sulfate  325 mg Oral Q breakfast  . insulin aspart  0-9 Units Subcutaneous Q4H  . insulin glargine  30 Units Subcutaneous QHS  . levothyroxine  112 mcg Oral QAC breakfast  . metoprolol succinate  50 mg Oral Daily   Infusions:  . sodium chloride 75 mL/hr at 03/26/13 2022  . heparin    . 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   Warfarin therapy  was interrupted on admission, a total of 4 units FFP was administered for INR reversal, and IV heparin was used for bridging.   At midnight 3/8 heparin was discontinued and on 3/9 patient underwent TURBT.  Orders were received today from Dr. Wyline Copas to resume warfarin along with IV heparin bridge until INR therapeutic, with pharmacy dosing assistance for anticoagulants.  He has already discussed the case with urologist and obtained clearance to proceed with this.   Heparin dosing records from earlier this admission were reviewed.  Patient's dosage requirements were variable and larger than predicted for age and weight.     Goal of Therapy:  Heparin level 0.3-0.7 units/ml Monitor platelets by anticoagulation protocol: Yes   Plan:  1. Begin Heparin 1000 units/hr IV infusion, NO BOLUS.  (using conservative initial rate compared to previous requirements to limit hemorrhagic risk postoperatively). 2. PT/INR now.  Will order today's warfarin dose when result available. 3. Heparin level 8 hours after infusion begins. 4. Heparin level and CBC daily. 5. Monitor for worsening hematuria.  Clayburn Pert, PharmD, BCPS Pager: [phone] 03/28/2013  12:30 PM

## 2013-03-28 NOTE — Discharge Instructions (Signed)
Percutaneous Nephrostomy, Care After Refer to this sheet in the next few weeks. These instructions provide you with information on caring for yourself after your procedure. Your health care provider may also give you more specific instructions. Your treatment has been planned according to current medical practices, but problems sometimes occur. Call your health care provider if you have any problems or questions after your procedure. WHAT TO EXPECT AFTER THE PROCEDURE  You will need to remain lying down for several hours. Some people are able to leave the hospital the same day, others stay overnight. HOME CARE INSTRUCTIONS  Your nephrostomy tube is connected to a leg bag or bedside drainage bag. Always keep the tubing, the leg bag, or the bedside drainage bags below the level of the kidney so that the urine drains freely.  During the day, if you are connecting the nephrostomy tube to a leg bag, be sure there are no kinks in the tubing and that the urine is draining freely.  At night, you may want to connect the nephrostomy tube or the leg bag to a larger bedside drainage bag.  Change the dressing as often as directed by your health care provider or if it becomes wet.  Gently remove the tapes and dressing from around the nephrostomy tube. Be careful not to pull on the tube while removing the dressing.  Wash the skin around the tube, rinse well and dry.  Place two split drain sponges in and around the tube exit site.  Place tape around edge of the dressing.  Secure the nephrostomy tubing. Remember to make certain that the nephrostomy tube does not kink or become pinched closed. It can be useful to wrap any exposed tubing going from the nephrostomy tube to any of the connecting tubes to either the leg bag or drainage bag with an elastic bandage.  Every three weeks, replace the leg bag, drainage bag, and any extension tubing connected to your nephrostomy tube. Your health care provider will  explain how to change the drainage bag and extension tubing. SEEK MEDICAL CARE IF:  You experience any problems with any of the valves or tubing.  You have persistent pain or soreness in your back. SEEK IMMEDIATE MEDICAL CARE IF:  You have a fever or chills.  You have abdominal pain during the first week.  You have a new appearance of blood in urine.  You have back pain that is not relieved by your medicine.  You have redness, swelling, or drainage at the tube insertion site.  You have decreased urine output.  Your nephrostomy tube comes out. Document Released: 08/29/2003 Document Revised: 10/26/2012 Document Reviewed: 09/01/2012 [facility] Patient Information 2014 [place], [place]. Transurethral Resection of Bladder Tumor (TURBT)  Definition:  Transurethral Resection of the Bladder Tumor is a surgical procedure used to diagnose and remove tumors within the bladder. TURBT is the most common treatment for early stage bladder cancer.   General instructions:  Your recent bladder surgery requires very little post hospital care but some definite precautions.  Despite the fact that no skin incisions were used, the area around the tumor removal site is raw and covered with scabs to promote healing and prevent bleeding. Certain precautions are needed to insure that the scabs are not disturbed over the next 2-4 weeks while the healing proceeds.  Because the raw surface inside your bladder and the irritating effects of urine you may expect frequency of urination and/or urgency (a stronger desire to urinate) and perhaps even getting up at  night more often. This will usually resolve or improve slowly over the healing period. You may see some blood in your urine over the first 6 weeks. Do not be alarmed, even if the urine was clear for a while. Get off your feet and drink lots of fluids until clearing occurs. If you start to pass clots or don't improve call us.   Diet:  You may return to your normal  diet immediately. Because of the raw surface of your bladder, alcohol, spicy foods, foods high in acid and drinks with caffeine may cause irritation or frequency and should be used in moderation. To keep your urine flowing freely and avoid constipation, drink plenty of fluids during the day (8-10 glasses). Tip: Avoid cranberry juice because it is very acidic.   Activity:  Your physical activity needs to be restricted somewhat.  We suggest that you reduce your activity under the circumstances until the bleeding has stopped. Heavy lifting (greater than 20 lbs.) and heavy exertion should be limited for 2-3 weeks.  Bowels:  It is important to keep your bowels regular during the postoperative period. Straining with bowel movements can cause bleeding. A bowel movement every other day is reasonable. Use a mild laxative if needed, such as milk of magnesia 2-3 tablespoons, or 2 Dulcolax tablets. Call if you continue to have problems. If you had been taking narcotics for pain, before, during or after your surgery, you may be constipated.   Medication:  You should resume your pre-surgery medications unless told not to. In addition you may be given an antibiotic to prevent or treat infection. Antibiotics are not always necessary. All medication should be taken as prescribed until the bottles are finished unless you are having an unusual reaction to one of the drugs.  General Anesthetic, Adult  A doctor specialized in giving anesthesia (anesthesiologist) or a nurse specialized in giving anesthesia (nurse anesthetist) gives medicine that makes you sleep while a procedure is performed (general anesthetic). Once the general anesthetic has been administered, you will be in a sleeplike state in which you feel no pain. After having a general anesthetic you may feel:  Dizzy.  Weak.  Drowsy.  Confused.  These feelings are normal and can be expected to last for up to 24 hours after the procedure.   LET YOUR CAREGIVER  KNOW ABOUT:  Allergies you have.  Medications you are taking, including herbs, eye drops, over the counter medications, dietary supplements, and creams.  Previous problems you have had with anesthetics or numbing medicines.  Use of cigarettes, alcohol, or illicit drugs.  Possibility of pregnancy, if this applies.  History of bleeding or blood disorders, including blood clots and clotting disorders.  Previous surgeries you have had and types of anesthetics you have received.  Family medical history, especially anesthetic problems.  Other health problems.   AFTER THE PROCEDURE  After surgery, you will be taken to the recovery area where a nurse will monitor your progress. You will be allowed to go home when you are awake, stable, taking fluids well, and without serious pain or complications.  For the first 24 hours following an anesthetic:  Have a responsible person with you.  Do not drive a car. If you are alone, do not take public transportation.  Do not engage in strenuous activity. You may usually resume normal activities the next day, or as advised by your caregiver.  Do not drink alcohol.  Do not take medicine that has not been prescribed by your caregiver.  Do not sign important papers or make important decisions as your judgement may be impaired.  You may resume a normal diet as directed.  Change bandages (dressings) as directed.  Only take over-the-counter or prescription medicines for pain, discomfort, or fever as directed by your caregiver.  If you have questions or problems that seem related to the anesthetic, call the hospital and ask for the anesthetist, anesthesiologist, or anesthesia department.   SEEK IMMEDIATE MEDICAL CARE IF:  You develop a rash.  You have difficulty breathing.  You have chest pain.  You have allergic problems.  You have uncontrolled nausea.  You have uncontrolled vomiting.  You develop any serious bleeding, especially from the incision site.    Document Released: 04/14/2007 Document Revised: 09/17/2010 Document Reviewed: 05/08/2010  [facility] Patient Information 2012 [place].

## 2013-03-28 NOTE — Progress Notes (Signed)
1 Day Post-Op  Subjective: Pt doing ok; denies sig flank pain  Objective: Vital signs in last 24 hours: Temp:  [97.7 F (36.5 C)-99.2 F (37.3 C)] 97.7 F (36.5 C) (03/10 1100) Pulse Rate:  [60-109] 109 (03/10 1100) Resp:  [12-18] 18 (03/10 1100) BP: (98-138)/(52-100) 105/53 mmHg (03/10 1100) SpO2:  [94 %-100 %] 95 % (03/10 1100) Last BM Date: 03/21/13  Intake/Output from previous day: 03/09 0701 - 03/10 0700 In: 2080 [I.V.:1700] Out: 5630 [Urine:5630] Intake/Output this shift: Total I/O In: 230 [P.O.:60; I.V.:150; Other:20] Out: 125 [Urine:125]  L/R PCN's intact, dressings dry, sites NT; outputs today 50/75 cc's dark bloody urine   Lab Results:   Recent Labs  03/27/13 0420 03/28/13 0455  WBC 8.0 10.7*  HGB 8.2* 9.2*  HCT 25.4* 29.7*  PLT 389 372   BMET  Recent Labs  03/27/13 0420 03/28/13 0455  NA 138 140  K 3.7 4.0  CL 103 104  CO2 20 19  GLUCOSE 66* 120*  BUN 27* 27*  CREATININE 1.96* 2.20*  CALCIUM 8.3* 9.0   PT/INR No results found for this basename: LABPROT, INR,  in the last 72 hours ABG No results found for this basename: PHART, PCO2, PO2, HCO3,  in the last 72 hours  Studies/Results: No results found.  Anti-infectives: Anti-infectives   Start     Dose/Rate Route Frequency Ordered Stop   03/28/13 1200  cefTRIAXone (ROCEPHIN) 1 g in dextrose 5 % 50 mL IVPB     1 g 100 mL/hr over 30 Minutes Intravenous Every 24 hours 03/26/13 1209     03/27/13 1200  cefTRIAXone (ROCEPHIN) 1 g in dextrose 5 % 50 mL IVPB  Status:  Discontinued     1 g 100 mL/hr over 30 Minutes Intravenous On call to O.R. 03/26/13 1209 03/27/13 0538   03/27/13 1200  [MAR Hold]  cefTRIAXone (ROCEPHIN) 1 g in dextrose 5 % 50 mL IVPB     (On MAR Hold since 03/27/13 1244)  Comments:  TO BE ADMINISTERED BY CRNA IN O.R. HOLDING AREA   1 g 100 mL/hr over 30 Minutes Intravenous On call to O.R. 03/27/13 0538 03/27/13 1335   03/25/13 1600  cefTRIAXone (ROCEPHIN) 1 g in dextrose 5 %  50 mL IVPB     1 g 100 mL/hr over 30 Minutes Intravenous Every 24 hours 03/25/13 1509 03/26/13 1630   03/24/13 1600  ceFAZolin (ANCEF) IVPB 2 g/50 mL premix     2 g 100 mL/hr over 30 Minutes Intravenous  Once 03/24/13 1552 03/24/13 1630      Assessment/Plan: s/p Procedure(s): TRANSURETHRAL RESECTION OF BLADDER TUMOR (TURBT) (N/A) CYSTOSCOPY RETROGRADE PYELOGRAM POSSIBLE URETEROSCOPY (N/A) S/p bilateral PCN's 3/6 ; irrigate PCN's prn; monitor labs; attempt at ureteral stenting when ok with urology  LOS: 6 days    ALLRED,D [facility] 03/28/2013

## 2013-03-28 NOTE — Progress Notes (Signed)
PHARMACY BRIEF NOTE - WARFARIN  INR 1.27 today.  Patient confirms her usual home warfarin dosage as 3 mg daily. Tolerating small amounts of renal/carb modified diet.  Plan: 1. Warfarin 4 mg PO x1 today at 6pm 2. PT / INR daily.  Clayburn Pert, PharmD, BCPS Pager: [phone] 03/28/2013  2:38 PM

## 2013-03-28 NOTE — Anesthesia Postprocedure Evaluation (Signed)
  Anesthesia Post-op Note  Patient: Heather [name]  Procedure(s) Performed: Procedure(s) (LRB): TRANSURETHRAL RESECTION OF BLADDER TUMOR (TURBT) (N/A) CYSTOSCOPY RETROGRADE PYELOGRAM POSSIBLE URETEROSCOPY (N/A)  Patient Location: PACU  Anesthesia Type: General  Level of Consciousness: awake and alert   Airway and Oxygen Therapy: Patient Spontanous Breathing  Post-op Pain: mild  Post-op Assessment: Post-op Vital signs reviewed, Patient's Cardiovascular Status Stable, Respiratory Function Stable, Patent Airway and No signs of Nausea or vomiting  Last Vitals:  Filed Vitals:   03/28/13 0426  BP: 110/58  Pulse: 84  Temp: 37.3 C  Resp: 16    Post-op Vital Signs: stable   Complications: No apparent anesthesia complications

## 2013-03-28 NOTE — Progress Notes (Signed)
Patient still has not voided since foley d/c'd this morning. Patient states that there is mild discomfort in abdomen. Asked patient if she has to urinate and she said not right now. Bladder scan performed and showed 173 ml. Nephrostomy tubes patent, draining, and emptied. R nephrostomy had 50 ml, L nephrostomy had 20 ml. Schorr, NP notified via text page. Page returned. No new orders placed. Will continue to monitor.

## 2013-03-29 DIAGNOSIS — C679 Malignant neoplasm of bladder, unspecified: Secondary | ICD-10-CM | POA: Diagnosis present

## 2013-03-29 LAB — GLUCOSE, CAPILLARY
GLUCOSE-CAPILLARY: 112 mg/dL — AB (ref 70–99)
GLUCOSE-CAPILLARY: 161 mg/dL — AB (ref 70–99)
Glucose-Capillary: 146 mg/dL — ABNORMAL HIGH (ref 70–99)
Glucose-Capillary: 157 mg/dL — ABNORMAL HIGH (ref 70–99)
Glucose-Capillary: 137 mg/dL — ABNORMAL HIGH (ref 70–99)

## 2013-03-29 LAB — BASIC METABOLIC PANEL
BUN: 29 mg/dL — AB (ref 6–23)
CALCIUM: 8.8 mg/dL (ref 8.4–10.5)
CO2: 20 meq/L (ref 19–32)
Chloride: 103 mEq/L (ref 96–112)
Creatinine, Ser: 2.31 mg/dL — ABNORMAL HIGH (ref 0.50–1.10)
GFR calc Af Amer: 23 mL/min — ABNORMAL LOW (ref >90)
GFR calc non Af Amer: 20 mL/min — ABNORMAL LOW (ref >90)
Glucose, Bld: 105 mg/dL — ABNORMAL HIGH (ref 70–99)
Potassium: 3.7 mEq/L (ref 3.7–5.3)
SODIUM: 138 meq/L (ref 137–147)

## 2013-03-29 LAB — CBC
HEMATOCRIT: 26.3 % — AB (ref 36.0–46.0)
HEMOGLOBIN: 7.9 g/dL — AB (ref 12.0–15.0)
MCH: 26 pg (ref 26.0–34.0)
MCHC: 30 g/dL (ref 30.0–36.0)
MCV: 86.5 fL (ref 78.0–100.0)
Platelets: 308 10*3/uL (ref 150–400)
RBC: 3.04 MIL/uL — ABNORMAL LOW (ref 3.87–5.11)
RDW: 16.4 % — ABNORMAL HIGH (ref 11.5–15.5)
WBC: 8.3 10*3/uL (ref 4.0–10.5)

## 2013-03-29 LAB — HEPARIN LEVEL (UNFRACTIONATED)
HEPARIN UNFRACTIONATED: 0.36 [IU]/mL (ref 0.30–0.70)
Heparin Unfractionated: 0.12 IU/mL — ABNORMAL LOW (ref 0.30–0.70)
Heparin Unfractionated: 0.33 IU/mL (ref 0.30–0.70)

## 2013-03-29 LAB — PROTIME-INR
INR: 1.36 (ref 0.00–1.49)
Prothrombin Time: 16.4 seconds — ABNORMAL HIGH (ref 11.6–15.2)

## 2013-03-29 MED ORDER — CEPHALEXIN 500 MG PO CAPS
500.0000 mg | ORAL_CAPSULE | Freq: Two times a day (BID) | ORAL | Status: DC
  Administered 2013-03-29 – 2013-04-03 (×11): 500 mg via ORAL
  Filled 2013-03-29 (×12): qty 1

## 2013-03-29 MED ORDER — WARFARIN SODIUM 4 MG PO TABS
4.0000 mg | ORAL_TABLET | Freq: Once | ORAL | Status: AC
  Administered 2013-03-29: 4 mg via ORAL
  Filled 2013-03-29: qty 1

## 2013-03-29 MED ORDER — POLYETHYLENE GLYCOL 3350 17 G PO PACK
17.0000 g | PACK | Freq: Every day | ORAL | Status: DC
  Administered 2013-03-29 – 2013-03-30 (×2): 17 g via ORAL
  Filled 2013-03-29 (×3): qty 1

## 2013-03-29 MED ORDER — HEPARIN (PORCINE) IN NACL 100-0.45 UNIT/ML-% IJ SOLN
1200.0000 [IU]/h | INTRAMUSCULAR | Status: DC
  Administered 2013-03-29: 1200 [IU]/h via INTRAVENOUS
  Filled 2013-03-29 (×2): qty 250

## 2013-03-29 MED ORDER — DOCUSATE SODIUM 100 MG PO CAPS
100.0000 mg | ORAL_CAPSULE | Freq: Two times a day (BID) | ORAL | Status: DC
  Administered 2013-03-29 – 2013-04-03 (×10): 100 mg via ORAL
  Filled 2013-03-29 (×12): qty 1

## 2013-03-29 NOTE — Progress Notes (Signed)
Second bladder scan performed during shift. Bladder scan showed 37 ml. Patient in no discomfort. Patient was asked if she had to urinate and she said "not really". Will continue to monitor.

## 2013-03-29 NOTE — Progress Notes (Addendum)
ANTICOAGULATION CONSULT NOTE - Follow Up Consult  Pharmacy Consult for heparin, warfarin Indication: hx of VTE  Allergies  Allergen Reactions  . Motrin [Ibuprofen] Rash  . Sulfa Drugs Cross Reactors Rash  . Iohexol      Code: HIVES, Desc: pt states she has dye allergy, Onset Date: 08657846   . Quinine Derivatives Rash    Patient Measurements: Height: 5\' 1"  (154.9 cm) Weight: 160 lb 4.4 oz (72.7 kg) IBW/kg (Calculated) : 47.8   Vital Signs: Temp: 98.1 F (36.7 C) (03/11 0528) Temp src: Oral (03/11 0528) BP: 121/53 mmHg (03/11 0528) Pulse Rate: 82 (03/11 0528)  Labs:  Recent Labs  03/26/13 2004  03/27/13 0420 03/28/13 0455 03/28/13 1401 03/28/13 2359 03/29/13 0345 03/29/13 1108  HGB  --   < > 8.2* 9.2*  --   --  7.9*  --   HCT  --   < > 25.4* 29.7*  --   --  26.3*  --   PLT  --   --  389 372  --   --  308  --   LABPROT  --   --   --   --  15.6*  --  16.4*  --   INR  --   --   --   --  1.27  --  1.36  --   HEPARINUNFRC 0.41  --   --   --   --  0.12*  --  0.36  CREATININE  --   --  1.96* 2.20*  --   --  2.31*  --   < > = values in this interval not displayed.  Estimated Creatinine Clearance: 19.5 ml/min (by C-G formula based on Cr of 2.31).   Medications:  Scheduled:  . cephALEXin  500 mg Oral Q12H  . cloNIDine  0.1 mg Oral BID  . docusate sodium  100 mg Oral BID  . ezetimibe  10 mg Oral Daily  . feeding supplement (GLUCERNA SHAKE)  237 mL Oral BID BM  . ferrous sulfate  325 mg Oral Q breakfast  . insulin aspart  0-9 Units Subcutaneous TID WC  . insulin glargine  30 Units Subcutaneous QHS  . levothyroxine  112 mcg Oral QAC breakfast  . metoprolol succinate  50 mg Oral Daily  . polyethylene glycol  17 g Oral Daily  . Warfarin - Pharmacist Dosing Inpatient   Does not apply q1800   Infusions:  . sodium chloride 75 mL/hr at 03/29/13 0552  . heparin 1,200 Units/hr (03/29/13 0205)  . 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   Warfarin therapy was interrupted on admission, a total of 4 units FFP was administered for INR reversal, and IV heparin was used for bridging.   At midnight 3/8 heparin was discontinued and on 3/9 patient underwent TURBT. Orders were received today from Dr. Wyline Copas to resume warfarin along with IV heparin bridge until INR therapeutic, with pharmacy dosing assistance for anticoagulants.  He has already discussed the case with urologist and obtained clearance to proceed with this. Heparin dosing records from earlier this admission were reviewed.  Patient's dosage requirements were variable and larger than predicted for age and weight.     Heparin level therapeutic at rate of 1200 units/hr after  level was low late last night on a rate of 1000 units/hr  INR 1.36, up from 1.27 after receiving one dose of 4mg  on 3/10  Stable Plts, but drop in Hgb - no overt signs of bleeding and no bleeding per discussion with patient  Goal of Therapy:  Heparin level 0.3-0.7 units/ml Monitor platelets by anticoagulation protocol: Yes INR 2-3   Plan:  1) Continue current rate of IV heparin 1200 units/hr 2) Will check a confirmatory heparin level at 7pm tonight to verify that current rate continues to be correct rate 3) Of note, with patient's renal dysfunction currently and SCr increasing, IV heparin would be safer option than changing to Lovenox. Lovenox's kinetics can be erratic in patient's with renal dysfunction 4) Repeat warfarin 4mg  tonight 5) Daily INR, heparin level and CBC   Adrian Saran, PharmD, BCPS Pager [phone] 03/29/2013 12:02 PM

## 2013-03-29 NOTE — Evaluation (Signed)
Physical Therapy Evaluation Patient Details Name: Heather [name] MRN: [id number] DOB: October 21, 1940 Today's Date: 03/29/2013 Time: 6160-7371 PT Time Calculation (min): 21 min  PT Assessment / Plan / Recommendation History of Present Illness  73 yo female admitted with acute kidney injury. S/P transurethral resection of bladder tumor (TURBT) 3/9  Clinical Impression  On eval, pt required Min assist for mobility-able to ambulate ~60 feet with walker. Fatigues easily. Recommend HHPT, RW.     PT Assessment  Patient needs continued PT services    Follow Up Recommendations  Home health PT    Does the patient have the potential to tolerate intense rehabilitation      Barriers to Discharge        Equipment Recommendations  Rolling walker with 5" wheels (family states they are in process of getting wheelchair)    Recommendations for Other Services     Frequency Min 3X/week    Precautions / Restrictions Precautions Precautions: Fall Restrictions Weight Bearing Restrictions: No   Pertinent Vitals/Pain Pt denies pain      Mobility  Bed Mobility Overal bed mobility: Needs Assistance Bed Mobility: Supine to Sit;Sit to Supine Supine to sit: Min guard Sit to supine: Min assist General bed mobility comments: Assist for LEs back onto bed.  Transfers Overall transfer level: Needs assistance Equipment used: Rolling walker (2 wheeled) Transfers: Sit to/from Stand Sit to Stand: Min assist General transfer comment: Assist to rise, stabilize, control descent.  Ambulation/Gait Ambulation/Gait assistance: Min assist Ambulation Distance (Feet): 60 Feet Assistive device: Rolling walker (2 wheeled) Gait Pattern/deviations: Decreased stride length;Step-through pattern General Gait Details: slow gait speed. Assist to stabilize throughout ambulation. fatigues easily. dyspnea 2/4    Exercises     PT Diagnosis: Difficulty walking;Generalized weakness  PT Problem List: Decreased  strength;Decreased activity tolerance;Decreased mobility;Obesity;Decreased knowledge of use of DME PT Treatment Interventions: DME instruction;Gait training;Functional mobility training;Therapeutic activities;Therapeutic exercise;Patient/family education     PT Goals(Current goals can be found in the care plan section) Acute Rehab PT Goals Patient Stated Goal: home soon PT Goal Formulation: With patient/family Time For Goal Achievement: 04/12/13 Potential to Achieve Goals: Good  Visit Information  Last PT Received On: 03/29/13 Assistance Needed: +1 History of Present Illness: 73 yo female admitted with acute kidney injury. S/P transurethral resection of bladder tumor (TURBT) 3/9       Prior [place] expects to be discharged to:: Private residence Living Arrangements: Spouse/significant other;Children Type of Home: House Home Access: Stairs to enter Technical brewer of Steps: 4-front Entrance Stairs-Rails: Right Home Layout: One level Home Equipment: [place] - single point Prior Function Level of Independence: Independent with assistive device(s) Communication Communication: No difficulties    Cognition  Cognition Arousal/Alertness: Awake/alert Behavior During Therapy: WFL for tasks assessed/performed Overall Cognitive Status: Within Functional Limits for tasks assessed    Extremity/Trunk Assessment Upper Extremity Assessment Upper Extremity Assessment: Generalized weakness Lower Extremity Assessment Lower Extremity Assessment: Generalized weakness Cervical / Trunk Assessment Cervical / Trunk Assessment: Normal   Balance    End of Session PT - End of Session Activity Tolerance: Patient limited by fatigue Patient left: in bed;with call bell/phone within reach;with family/visitor present  GP     Weston Anna, MPT Pager: [phone]

## 2013-03-29 NOTE — Progress Notes (Signed)
ANTICOAGULATION CONSULT NOTE - Follow Up Consult  Pharmacy Consult for Heparin Indication: hx of VTE  Allergies  Allergen Reactions  . Motrin [Ibuprofen] Rash  . Sulfa Drugs Cross Reactors Rash  . Iohexol      Code: HIVES, Desc: pt states she has dye allergy, Onset Date: [id number]   . Quinine Derivatives Rash    Patient Measurements: Height: 5\' 1"  (154.9 cm) Weight: 160 lb 0.9 oz (72.6 kg) IBW/kg (Calculated) : 47.8 Heparin Dosing Weight:   Vital Signs: Temp: 99 F (37.2 C) (03/10 2147) Temp src: Oral (03/10 2147) BP: 118/50 mmHg (03/10 2147) Pulse Rate: 103 (03/10 2147)  Labs:  Recent Labs  03/26/13 0321 03/26/13 1033  03/26/13 2004 03/26/13 2144 03/27/13 0420 03/28/13 0455 03/28/13 1401 03/28/13 2359  HGB 6.7*  --   < >  --  8.4* 8.2* 9.2*  --   --   HCT 21.7*  --   < >  --  26.7* 25.4* 29.7*  --   --   PLT 381  --   --   --   --  389 372  --   --   LABPROT  --   --   --   --   --   --   --  15.6*  --   INR  --   --   --   --   --   --   --  1.27  --   HEPARINUNFRC 0.48 0.20*  --  0.41  --   --   --   --  0.12*  CREATININE 3.15*  --   --   --   --  1.96* 2.20*  --   --   < > = values in this interval not displayed.  Estimated Creatinine Clearance: 20.4 ml/min (by C-G formula based on Cr of 2.2).   Medications:  Infusions:  . sodium chloride 75 mL/hr at 03/26/13 2022  . heparin 1,200 Units/hr (03/29/13 0205)  . lactated ringers      Assessment: Patient with low heparin level.  No issues per RN.  Goal of Therapy:  Heparin level 0.3-0.7 units/ml Monitor platelets by anticoagulation protocol: Yes   Plan:  Increase heparin to 1200 units/hr, recheck level at [place], [place] 03/29/2013,2:43 AM

## 2013-03-29 NOTE — Progress Notes (Signed)
2 Days Post-Op  Subjective: Pt has been unable to void since foley removed yesterday; denies flank/abd pain  Objective: Vital signs in last 24 hours: Temp:  [97.7 F (36.5 C)-99 F (37.2 C)] 98.1 F (36.7 C) (03/11 0528) Pulse Rate:  [60-109] 82 (03/11 0528) Resp:  [18] 18 (03/11 0528) BP: (105-121)/(50-53) 121/53 mmHg (03/11 0528) SpO2:  [91 %-95 %] 92 % (03/11 0528) Weight:  [160 lb 4.4 oz (72.7 kg)] 160 lb 4.4 oz (72.7 kg) (03/11 0528) Last BM Date: 03/21/13  Intake/Output from previous day: 03/10 0701 - 03/11 0700 In: 2101.3 [P.O.:180; I.V.:1901.3] Out: 340 [Urine:340] Intake/Output this shift:    L/R PCN's intact, outputs (L)-115 cc's,(R)- 225 cc's bloody to blood tinged urine; cx's - e coli; both PCN's irrigated with sterile NS with sl more resistance met with right secondary to clot  Lab Results:   Recent Labs  03/28/13 0455 03/29/13 0345  WBC 10.7* 8.3  HGB 9.2* 7.9*  HCT 29.7* 26.3*  PLT 372 308   BMET  Recent Labs  03/28/13 0455 03/29/13 0345  NA 140 138  K 4.0 3.7  CL 104 103  CO2 19 20  GLUCOSE 120* 105*  BUN 27* 29*  CREATININE 2.20* 2.31*  CALCIUM 9.0 8.8   PT/INR  Recent Labs  03/28/13 1401 03/29/13 0345  LABPROT 15.6* 16.4*  INR 1.27 1.36   ABG No results found for this basename: PHART, PCO2, PO2, HCO3,  in the last 72 hours  Studies/Results: No results found.  Anti-infectives: Anti-infectives   Start     Dose/Rate Route Frequency Ordered Stop   03/29/13 1000  cephALEXin (KEFLEX) capsule 500 mg     500 mg Oral Every 12 hours 03/29/13 0907     03/28/13 1200  cefTRIAXone (ROCEPHIN) 1 g in dextrose 5 % 50 mL IVPB  Status:  Discontinued     1 g 100 mL/hr over 30 Minutes Intravenous Every 24 hours 03/26/13 1209 03/29/13 0907   03/27/13 1200  cefTRIAXone (ROCEPHIN) 1 g in dextrose 5 % 50 mL IVPB  Status:  Discontinued     1 g 100 mL/hr over 30 Minutes Intravenous On call to O.R. 03/26/13 1209 03/27/13 0538   03/27/13 1200  [MAR  Hold]  cefTRIAXone (ROCEPHIN) 1 g in dextrose 5 % 50 mL IVPB     (On MAR Hold since 03/27/13 1244)  Comments:  TO BE ADMINISTERED BY CRNA IN O.R. HOLDING AREA   1 g 100 mL/hr over 30 Minutes Intravenous On call to O.R. 03/27/13 0538 03/27/13 1335   03/25/13 1600  cefTRIAXone (ROCEPHIN) 1 g in dextrose 5 % 50 mL IVPB     1 g 100 mL/hr over 30 Minutes Intravenous Every 24 hours 03/25/13 1509 03/26/13 1630   03/24/13 1600  ceFAZolin (ANCEF) IVPB 2 g/50 mL premix     2 g 100 mL/hr over 30 Minutes Intravenous  Once 03/24/13 1552 03/24/13 1630      Assessment/Plan: s/p Procedure(s): TRANSURETHRAL RESECTION OF BLADDER TUMOR (TURBT) (N/A) CYSTOSCOPY RETROGRADE PYELOGRAM POSSIBLE URETEROSCOPY (N/A) S/p bilat PCN'S 3/6 secondary to hydro from bladder ca; plans as per urology  LOS: 7 days    Juliah Scadden,D [facility] 03/29/2013

## 2013-03-29 NOTE — Progress Notes (Addendum)
[facility] PROGRESS NOTE  Heather [name] JJK:[id number] DOB: 09-20-1940 DOA: 03/22/2013  PCP: Rogene Houston, MD  Brief Summary 73yo who presents with B hydronephrosis secondary to bladder tumour. 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 INR was reversed and pt underwent B nephrostomy tube placement by IR. Pt then underwent TURBT by Urology on 03/27/13. OK to resume full dose anticoagulation per Urology. Pt's coumadin has been resumed 3/10 with heparin bridge. Renal fx had been improving nicely since nephrostomy tube placement, however there was a slight increase in Cr on 3/10.  Assessment/Plan: Acute-on-chronic kidney injury  This is secondary to obstructive uropathy from bladder tumor. Renal function did improve but has been increasing the last 2 days. Continue to monitor. She is s/p TURBT 3/9 and s/p nephrostomy tube placement on 3/6 with initial good improvement in renal fx. Will inform Urology about rise in creatinine and inability to urinate. Discussed with Dr. Dorina Hoyer and he will see patient later today.   Bladder Cancer See above. Surgical pathology shows invasive urothelial carcinoma. Further management as OP.  Diabetes mellitus Type 2 Stable. Cont current regimen  Hypertension and chronic diastolic dysfunction  Holding Lasix and reduced the Toprol to 50 daily at present, continue clonidine. BP well controlled   Active smoking No wheezing currently. Cont duonebs PRN  Hypothyroidism  Continue Synthroid as tolerated  Acute Blood Loss Anemia Likely secondary to blood loss via nephrostomy tubes while on anticoagulation. Improved with recent PRBC transfusion. Some drop in Hgb noted today. No overt bleeding. Monitor for now.  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 Had been maintained on chronic anticoagulation. Warfarin resumed 3/10. On IV heparin as well. Since drop in hgb was noted, will continue IV heparin for  now. If Hgb remains stable without further blood loss, we can consider transition to Lovenox. INR was reversed for procedures.   E Coli UTI Sensitive to Cefazolin. Change to Keflex orally.   Constipation Miralax and stool softeners.  Code Status: DNR DVT Prophylaxis: On warfarin/Heparin Family Communication: Pt and family in room Disposition Plan: PT/OT evaluation. Will likely return home when stable.  Consultants:  Urology  IR  Procedures:  B nephrostomy tube placement per IR 03/24/13  TURBT 03/27/13  Antibiotics:  Rocephin 03/27/13>>>3/11  Kefelx 3/11-->  HPI/Subjective: Unable to void urine. Has been constipated. No other complaints.  Objective: Filed Vitals:   03/28/13 1000 03/28/13 1100 03/28/13 2147 03/29/13 0528  BP:  105/53 118/50 121/53  Pulse: 60 109 103 82  Temp:  97.7 F (36.5 C) 99 F (37.2 C) 98.1 F (36.7 C)  TempSrc:  Oral Oral Oral  Resp:  18 18 18   Height:      Weight:    72.7 kg (160 lb 4.4 oz)  SpO2:  95% 91% 92%    Intake/Output Summary (Last 24 hours) at 03/29/13 0909 Last data filed at 03/29/13 0659  Gross per 24 hour  Intake 1891.25 ml  Output    340 ml  Net 1551.25 ml   Filed Weights   03/25/13 0500 03/26/13 0557 03/29/13 0528  Weight: 71.759 kg (158 lb 3.2 oz) 72.6 kg (160 lb 0.9 oz) 72.7 kg (160 lb 4.4 oz)    Exam:  General:  Awake, in nad  Cardiovascular: regular, s1, s2  Respiratory: normal resp effort, no wheezing  Abdomen: soft, nondistended. Mild vague tenderness in lower abdomen without rebound rigidity or guarding.  BS present.  Musculoskeletal: perfused, no clubbing   Data Reviewed: Basic Metabolic Panel:  Recent Labs Lab 03/25/13 0439 03/26/13 0321 03/27/13 0420 03/28/13 0455 03/29/13 0345  NA 137 138 138 140 138  K 4.8 4.1 3.7 4.0 3.7  CL 103 105 103 104 103  CO2 22 20 20 19 20   GLUCOSE 150* 126* 66* 120* 105*  BUN 52* 41* 27* 27* 29*  CREATININE 4.03* 3.15* 1.96* 2.20* 2.31*  CALCIUM 8.4 8.4 8.3*  9.0 8.8   Liver Function Tests:  Recent Labs Lab 03/22/13 1803 03/23/13 0530  AST 23 17  ALT 10 7  ALKPHOS 244* 185*  BILITOT <0.2* <0.2*  PROT 8.5* 6.5  ALBUMIN 3.0* 2.2*   CBC:  Recent Labs Lab 03/22/13 1650  03/25/13 0439 03/26/13 0321  03/26/13 1811 03/26/13 2144 03/27/13 0420 03/28/13 0455 03/29/13 0345  WBC 9.9  < > 8.9 8.6  --   --   --  8.0 10.7* 8.3  NEUTROABS 6.5  --   --   --   --   --   --   --   --   --   HGB 10.0*  < > 7.1* 6.7*  < > 8.5* 8.4* 8.2* 9.2* 7.9*  HCT 31.2*  < > 23.7* 21.7*  < > 26.5* 26.7* 25.4* 29.7* 26.3*  MCV 85.0  < > 87.1 85.4  --   --   --  84.1 86.3 86.5  PLT 286  < > 385 381  --   --   --  389 372 308  < > = values in this interval not displayed.  CBG:  Recent Labs Lab 03/28/13 0741 03/28/13 1147 03/28/13 1620 03/28/13 2149 03/29/13 0729  GLUCAP 131* 163* 156* 146* 112*    Recent Results (from the past 240 hour(s))  URINE CULTURE     Status: None   Collection Time    03/22/13  5:02 PM      Result Value Ref Range Status   Specimen Description URINE, CLEAN CATCH   Final   Special Requests NONE   Final   Culture  Setup Time     Final   Value: 03/23/2013 05:15     Performed at [place]     Final   Value: >=100,000 COLONIES/ML     Performed at [facility]   Culture     Final   Value: ESCHERICHIA COLI     Performed at [facility]   Report Status 03/25/2013 FINAL   Final   Organism ID, Bacteria ESCHERICHIA COLI   Final  MRSA PCR SCREENING     Status: None   Collection Time    03/24/13  2:05 PM      Result Value Ref Range Status   MRSA by PCR NEGATIVE  NEGATIVE Final   Comment:            The GeneXpert MRSA Assay (FDA     approved for NASAL specimens     only), is one component of a     comprehensive MRSA colonization     surveillance program. It is not     intended to diagnose MRSA     infection nor to guide or     monitor treatment for     MRSA infections.   SURGICAL PCR SCREEN     Status: Abnormal   Collection Time    03/27/13  3:52 AM      Result  Value Ref Range Status   MRSA, PCR POSITIVE (*) NEGATIVE Final   Comment: RESULT CALLED TO, READ BACK BY AND VERIFIED WITH:     A. RODRIGUIZ RN AT [id number] ON 03.09.15 BY SHUEA   Staphylococcus aureus POSITIVE (*) NEGATIVE Final   Comment:            The Xpert SA Assay (FDA     approved for NASAL specimens     in patients over 1 years of age),     is one component of     a comprehensive surveillance     program.  Test performance has     been validated by [employer] for patients greater     than or equal to 85 year old.     It is not intended     to diagnose infection nor to     guide or monitor treatment.     Studies: No results found.  Scheduled Meds: . cephALEXin  500 mg Oral Q12H  . cloNIDine  0.1 mg Oral BID  . docusate sodium  100 mg Oral BID  . ezetimibe  10 mg Oral Daily  . feeding supplement (GLUCERNA SHAKE)  237 mL Oral BID BM  . ferrous sulfate  325 mg Oral Q breakfast  . insulin aspart  0-9 Units Subcutaneous TID WC  . insulin glargine  30 Units Subcutaneous QHS  . levothyroxine  112 mcg Oral QAC breakfast  . metoprolol succinate  50 mg Oral Daily  . polyethylene glycol  17 g Oral Daily  . Warfarin - Pharmacist Dosing Inpatient   Does not apply q1800   Continuous Infusions: . sodium chloride 75 mL/hr at 03/29/13 0552  . heparin 1,200 Units/hr (03/29/13 0205)  . lactated ringers      Principal Problem:   Acute-on-chronic kidney injury Active Problems:   History of pulmonary embolus (PE)   Diabetes mellitus   Hypertension   Open wound of abdominal wall without complication   Smoking   Obstructive uropathy   Bladder tumor   Bilateral hydronephrosis  Time spent: 101min  Heather [name]  [facility] Pager [phone].   If 7PM-7AM, please contact night-coverage at [email or web address], password [facility] 03/29/2013, 9:09 AM  LOS: 7 days

## 2013-03-29 NOTE — Progress Notes (Signed)
PHARMACY BRIEF NOTE - HEPARIN  Repeat heparin level 0.33 - remained therapeutic with infusion at 1200 units/hr No complications documented  Plan:  1.  Continue heparin at 1200 units/hr. 2.  F/u HL in AM.  Hershal Coria, PharmD, BCPS Pager: [phone] 03/29/2013 8:37 PM

## 2013-03-30 LAB — CBC
HEMATOCRIT: 23.7 % — AB (ref 36.0–46.0)
Hemoglobin: 7.3 g/dL — ABNORMAL LOW (ref 12.0–15.0)
MCH: 26.4 pg (ref 26.0–34.0)
MCHC: 30.8 g/dL (ref 30.0–36.0)
MCV: 85.9 fL (ref 78.0–100.0)
Platelets: 281 10*3/uL (ref 150–400)
RBC: 2.76 MIL/uL — AB (ref 3.87–5.11)
RDW: 16.2 % — ABNORMAL HIGH (ref 11.5–15.5)
WBC: 7.7 10*3/uL (ref 4.0–10.5)

## 2013-03-30 LAB — GLUCOSE, CAPILLARY
GLUCOSE-CAPILLARY: 136 mg/dL — AB (ref 70–99)
GLUCOSE-CAPILLARY: 124 mg/dL — AB (ref 70–99)
Glucose-Capillary: 81 mg/dL (ref 70–99)
Glucose-Capillary: 129 mg/dL — ABNORMAL HIGH (ref 70–99)

## 2013-03-30 LAB — BASIC METABOLIC PANEL
BUN: 28 mg/dL — AB (ref 6–23)
CALCIUM: 8.7 mg/dL (ref 8.4–10.5)
CO2: 17 meq/L — AB (ref 19–32)
Chloride: 106 mEq/L (ref 96–112)
Creatinine, Ser: 2.27 mg/dL — ABNORMAL HIGH (ref 0.50–1.10)
GFR calc Af Amer: 23 mL/min — ABNORMAL LOW (ref >90)
GFR calc non Af Amer: 20 mL/min — ABNORMAL LOW (ref >90)
GLUCOSE: 102 mg/dL — AB (ref 70–99)
Potassium: 3.9 mEq/L (ref 3.7–5.3)
Sodium: 139 mEq/L (ref 137–147)

## 2013-03-30 LAB — HEPARIN LEVEL (UNFRACTIONATED): Heparin Unfractionated: 0.38 IU/mL (ref 0.30–0.70)

## 2013-03-30 LAB — PROTIME-INR
INR: 2.24 — ABNORMAL HIGH (ref 0.00–1.49)
Prothrombin Time: 24.1 seconds — ABNORMAL HIGH (ref 11.6–15.2)

## 2013-03-30 MED ORDER — FUROSEMIDE 10 MG/ML IJ SOLN
80.0000 mg | Freq: Once | INTRAMUSCULAR | Status: AC
  Administered 2013-03-30: 80 mg via INTRAVENOUS
  Filled 2013-03-30: qty 8

## 2013-03-30 MED ORDER — CHLORHEXIDINE GLUCONATE CLOTH 2 % EX PADS
6.0000 | MEDICATED_PAD | Freq: Every day | CUTANEOUS | Status: DC
  Administered 2013-03-31 – 2013-04-03 (×4): 6 via TOPICAL

## 2013-03-30 MED ORDER — WARFARIN SODIUM 1 MG PO TABS
1.0000 mg | ORAL_TABLET | Freq: Once | ORAL | Status: AC
  Administered 2013-03-30: 1 mg via ORAL
  Filled 2013-03-30: qty 1

## 2013-03-30 MED ORDER — MUPIROCIN 2 % EX OINT
1.0000 "application " | TOPICAL_OINTMENT | Freq: Two times a day (BID) | CUTANEOUS | Status: DC
  Administered 2013-03-30 – 2013-04-03 (×8): 1 via NASAL
  Filled 2013-03-30: qty 22

## 2013-03-30 NOTE — Progress Notes (Addendum)
Patient has not voided since foley D/C'd on 3/10 at 0815. No output from R and L nephrostomy since 3/11 1800.  Bladder scan performed->76 ml. Patient is stable, no c/o discomfort or pain. R and L nephrostomy dressings are clean, dry, and intact.  Patient does have 2+ pitting edema bilateral extremities. Patient stated that she takes Lasix at home, but has not been on Lasix during her hospitalization. Urologist on call and NP on call notified. Page from [employer] and NP returned with no new orders placed. Will continue to monitor.

## 2013-03-30 NOTE — Progress Notes (Signed)
3 Days Post-Op Subjective: Patient reports that she is having less abdominal pain. No nephrostomy related complaints. She has not voided, but has no urge or pelvic pain.  Objective: Vital signs in last 24 hours: Temp:  [98.2 F (36.8 C)-99.2 F (37.3 C)] 98.2 F (36.8 C) (03/12 0508) Pulse Rate:  [76-89] 76 (03/12 0508) Resp:  [18] 18 (03/12 0508) BP: (115-141)/(52-60) 124/57 mmHg (03/12 0508) SpO2:  [90 %-98 %] 98 % (03/12 0508) Weight:  [74 kg (163 lb 2.3 oz)] 74 kg (163 lb 2.3 oz) (03/12 0508)  Intake/Output from previous day: 03/11 0701 - 03/12 0700 In: 2832 [P.O.:840; I.V.:1992] Out: 165 [Urine:165] Intake/Output this shift:    Physical Exam:  Constitutional: Vital signs reviewed. WD WN in NAD   Eyes: PERRL, No scleral icterus.   Cardiovascular: RRR Abdominal: Soft. Non-tender, non-distended, no masses, organomegaly, or guarding present.  Genitourinary: Extremities: No cyanosis or edema   Lab Results:  Recent Labs  03/28/13 0455 03/29/13 0345 03/30/13 0350  HGB 9.2* 7.9* 7.3*  HCT 29.7* 26.3* 23.7*   BMET  Recent Labs  03/29/13 0345 03/30/13 0350  NA 138 139  K 3.7 3.9  CL 103 106  CO2 20 17*  GLUCOSE 105* 102*  BUN 29* 28*  CREATININE 2.31* 2.27*  CALCIUM 8.8 8.7    Recent Labs  03/28/13 1401 03/29/13 0345 03/30/13 0350  INR 1.27 1.36 2.24*   No results found for this basename: LABURIN,  in the last 72 hours Results for orders placed during the hospital encounter of 03/22/13  URINE CULTURE     Status: None   Collection Time    03/22/13  5:02 PM      Result Value Ref Range Status   Specimen Description URINE, CLEAN CATCH   Final   Special Requests NONE   Final   Culture  Setup Time     Final   Value: 03/23/2013 05:15     Performed at [place]     Final   Value: >=100,000 COLONIES/ML     Performed at [facility]   Culture     Final   Value: ESCHERICHIA COLI     Performed at [facility]   Report Status 03/25/2013 FINAL   Final   Organism ID, Bacteria ESCHERICHIA COLI   Final  MRSA PCR SCREENING     Status: None   Collection Time    03/24/13  2:05 PM      Result Value Ref Range Status   MRSA by PCR NEGATIVE  NEGATIVE Final   Comment:            The GeneXpert MRSA Assay (FDA     approved for NASAL specimens     only), is one component of a     comprehensive MRSA colonization     surveillance program. It is not     intended to diagnose MRSA     infection nor to guide or     monitor treatment for     MRSA infections.  SURGICAL PCR SCREEN     Status: Abnormal   Collection Time    03/27/13  3:52 AM      Result Value Ref Range Status   MRSA, PCR POSITIVE (*) NEGATIVE Final   Comment: RESULT CALLED TO, READ BACK BY AND VERIFIED WITH:     A. RODRIGUIZ RN AT [id number] ON 03.09.15 BY SHUEA   Staphylococcus aureus POSITIVE (*) NEGATIVE Final  Comment:            The Xpert SA Assay (FDA     approved for NASAL specimens     in patients over 40 years of age),     is one component of     a comprehensive surveillance     program.  Test performance has     been validated by [employer] for patients greater     than or equal to 49 year old.     It is not intended     to diagnose infection nor to     guide or monitor treatment.    Studies/Results: No results found.  Assessment/Plan:   1. Bladder cancer-high-grade, superficial invasion. She is status post TURBT 3 days ago. She has not voided after her catheter is been removed, but most of her urine is coming from her nephrostomy tubes. I discussed pathology with her. I don't think she needs adjuvant treatment for her bladder cancer, which, because of its limited size and stage, it is very unlikely to be the cause of her metastatic process    2. Bilateral hydronephrosis. On the right, secondary to retroperitoneal mass. On the left, possibly secondary to her bladder tumor. Treated with nephrostomy tubes with improvement of  her creatinine    3. UTI. Escherichia coli, pansensitive, adequately treated    4. Metastatic process. She needs further evaluation of this, can be performed as an outpatient    I agree with cephalexin for her UTI. 2-3 more days of treatment is all she needs.    I will arrange for her to get CT directed biopsy of her retroperitoneal mass as an outpatient    I will order nurses in and out catheterize her to assure ourselves that she does not have significant clot or retention.       LOS: 8 days   Franchot Gallo M 03/30/2013, 7:43 AM

## 2013-03-30 NOTE — Progress Notes (Signed)
OT Cancellation Note  Patient Details Name: Heather [name] MRN: [id number] DOB: Mar 15, 1940   Cancelled Treatment:    Reason Eval/Treat Not Completed: Other (comment).  Pt does not feel up to OT this pm.  Will check back tomorrow.    Annalyse Langlais 03/30/2013, 2:36 PM Lesle Chris, OTR/L [phone] 03/30/2013

## 2013-03-30 NOTE — Progress Notes (Signed)
Physical Therapy Treatment Patient Details Name: FELIX PRATT MRN: [id number] DOB: 1940/03/02 Today's Date: 03/30/2013 Time: 4008-6761 PT Time Calculation (min): 27 min  PT Assessment / Plan / Recommendation  History of Present Illness 73 yo female admitted with acute kidney injury. S/P transurethral resection of bladder tumor (TURBT) 3/9   PT Comments   Pt still somewhat deconditioned. Decreased ambulation distance this session. Continues to require Min assist for all mobility. Recommend HHPT, RW, wheelchair (less than 27" wide so it will fit through doorways in home).   Follow Up Recommendations  Home health PT;Supervision/Assistance - 24 hour     Does the patient have the potential to tolerate intense rehabilitation     Barriers to Discharge        Equipment Recommendations  Rolling walker with 5" wheels;Wheelchair ;Wheelchair cushion     Recommendations for Other Services    Frequency Min 3X/week   Progress towards PT Goals Progress towards PT goals: Progressing toward goals (slowly)  Plan Current plan remains appropriate    Precautions / Restrictions Precautions Precautions: Fall Restrictions Weight Bearing Restrictions: No   Pertinent Vitals/Pain C/o heel pain-unrated    Mobility  Bed Mobility Overal bed mobility: Needs Assistance Bed Mobility: Supine to Sit;Sit to Supine Supine to sit: Min assist Sit to supine: Min assist General bed mobility comments: Assist for LEs Transfers Overall transfer level: Needs assistance Equipment used: Rolling walker (2 wheeled) Transfers: Sit to/from Stand Sit to Stand: Min assist General transfer comment: Assist to rise, stabilize, control descent.  Ambulation/Gait Ambulation/Gait assistance: Min assist Ambulation Distance (Feet): 40 Feet Assistive device: Rolling walker (2 wheeled) Gait Pattern/deviations: Decreased stride length;Decreased step length - left;Decreased step length - right General Gait Details: slow gait  speed. Assist to stabilize throughout ambulation. fatigues easily. dyspnea 2/4    Exercises     PT Diagnosis:    PT Problem List:   PT Treatment Interventions:     PT Goals (current goals can now be found in the care plan section)    Visit Information  Last PT Received On: 03/30/13 Assistance Needed: +1 History of Present Illness: 73 yo female admitted with acute kidney injury. S/P transurethral resection of bladder tumor (TURBT) 3/9    Subjective Data      Cognition  Cognition Arousal/Alertness: Awake/alert Behavior During Therapy: WFL for tasks assessed/performed Overall Cognitive Status: Within Functional Limits for tasks assessed    Balance     End of Session PT - End of Session Activity Tolerance: Patient limited by fatigue Patient left: in bed;with call bell/phone within reach;with family/visitor present   GP     Weston Anna, MPT Pager: [phone]

## 2013-03-30 NOTE — Progress Notes (Signed)
Patient ID: Heather [name], female   DOB: 03/20/1940, 73 y.o.   MRN: [id number] I received a page this am at 1:39 am from RN stating patient had no urine output from nephrostomy tubes since 18:00 (6+ hrs). She was instructed to page on call MD - Dr. Matilde Sprang for further instructions. Pt was in no acute distress at time of conversation.

## 2013-03-30 NOTE — Progress Notes (Signed)
Emptied 15 ml from L nephrostomy. Patient resting. No s/s of acute distress. Will continue to monitor.

## 2013-03-30 NOTE — Progress Notes (Signed)
ANTICOAGULATION CONSULT NOTE - Follow Up Consult  Pharmacy Consult for heparin, warfarin Indication: hx of VTE  Allergies  Allergen Reactions  . Motrin [Ibuprofen] Rash  . Sulfa Drugs Cross Reactors Rash  . Iohexol      Code: HIVES, Desc: pt states she has dye allergy, Onset Date: 16109604   . Quinine Derivatives Rash    Patient Measurements: Height: 5\' 1"  (154.9 cm) Weight: 163 lb 2.3 oz (74 kg) IBW/kg (Calculated) : 47.8   Vital Signs: Temp: 98.2 F (36.8 C) (03/12 0508) Temp src: Oral (03/12 0508) BP: 124/57 mmHg (03/12 0508) Pulse Rate: 76 (03/12 0508)  Labs:  Recent Labs  03/28/13 0455 03/28/13 1401  03/29/13 0345 03/29/13 1108 03/29/13 1900 03/30/13 0350  HGB 9.2*  --   --  7.9*  --   --  7.3*  HCT 29.7*  --   --  26.3*  --   --  23.7*  PLT 372  --   --  308  --   --  281  LABPROT  --  15.6*  --  16.4*  --   --  24.1*  INR  --  1.27  --  1.36  --   --  2.24*  HEPARINUNFRC  --   --   < >  --  0.36 0.33 0.38  CREATININE 2.20*  --   --  2.31*  --   --  2.27*  < > = values in this interval not displayed.  Estimated Creatinine Clearance: 20 ml/min (by C-G formula based on Cr of 2.27).   Medications:  Scheduled:  . cephALEXin  500 mg Oral Q12H  . cloNIDine  0.1 mg Oral BID  . docusate sodium  100 mg Oral BID  . ezetimibe  10 mg Oral Daily  . feeding supplement (GLUCERNA SHAKE)  237 mL Oral BID BM  . ferrous sulfate  325 mg Oral Q breakfast  . insulin aspart  0-9 Units Subcutaneous TID WC  . insulin glargine  30 Units Subcutaneous QHS  . levothyroxine  112 mcg Oral QAC breakfast  . metoprolol succinate  50 mg Oral Daily  . polyethylene glycol  17 g Oral Daily  . Warfarin - Pharmacist Dosing Inpatient   Does not apply q1800   Infusions:  . sodium chloride 75 mL/hr at 03/29/13 0552  . heparin 1,200 Units/hr (03/29/13 0205)  . 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   Warfarin therapy was interrupted on admission, a total of 4 units FFP was administered for INR reversal, and IV heparin was used for bridging.   At midnight 3/8 heparin was discontinued and on 3/9 patient underwent TURBT. Orders were received today from Dr. Wyline Copas to resume warfarin along with IV heparin bridge until INR therapeutic, with pharmacy dosing assistance for anticoagulants.  He has already discussed the case with urologist and obtained clearance to proceed with this. Heparin dosing records from earlier this admission were reviewed.  Patient's dosage requirements were variable and larger than predicted for age and weight.     Heparin level therapeutic at rate of 1200 units/hr  INR now therapeutic and rising quicker than expected after receiving only 2 doses of 4mg   Stable Plts, but drop in Hgb - no overt signs of  bleeding and no bleeding per discussion with patient  Goal of Therapy:  Heparin level 0.3-0.7 units/ml Monitor platelets by anticoagulation protocol: Yes INR 2-3   Plan:  1) With INR now therapeutic, discontinue IV heparin 2) Will give only 1mg  of warfarin tonight due to sharp rise in INR 3) Daily INR, heparin level and CBC   Adrian Saran, PharmD, BCPS Pager [phone] 03/30/2013 8:32 AM

## 2013-03-30 NOTE — Progress Notes (Signed)
[facility] PROGRESS NOTE  DESTENI HEINBAUGH [id number] DOB: 1940-06-20 DOA: 03/22/2013  PCP: Rogene Houston, MD  Brief Summary 73yo who presents with B hydronephrosis secondary to bladder tumour. 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 INR was reversed and pt underwent B nephrostomy tube placement by IR. Pt then underwent TURBT by Urology on 03/27/13. OK to resume full dose anticoagulation per Urology. Pt's coumadin has been resumed 3/10 with heparin bridge. Renal fx had been improving nicely since nephrostomy tube placement.  Assessment/Plan: Acute-on-chronic kidney injury  This is secondary to obstructive uropathy from bladder tumor. Renal function did improve from admission. Remains around 2-2.5. She likely has underlying CKD due to DM.  Has low UO. Bicarb low today. Will give IV Lasix. If no improvement, may need to involve Nephrology. She is s/p TURBT 3/9 and s/p nephrostomy tube placement on 3/6 with initial good improvement in renal fx. Await Urology input today.   Bladder Cancer See above. Surgical pathology shows invasive urothelial carcinoma. Further management as OP.  Diabetes mellitus Type 2 Stable. Cont current regimen  Hypertension and chronic diastolic dysfunction  Continue clonidine and BB. BP well controlled.   Active smoking No wheezing currently. Cont duonebs PRN  Hypothyroidism  Continue Synthroid as tolerated  Acute Blood Loss Anemia Likely secondary to blood loss via nephrostomy tubes while on anticoagulation. Improved with recent PRBC transfusion. Some drop in Hgb noted. No further overt bleeding. Monitor for now. Transfuse if drops below 7.  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 Had been maintained on chronic anticoagulation. Warfarin resumed 3/10. INR therapeutic. Heparin stopped. INR was reversed for procedures.   E Coli UTI Sensitive to Cefazolin. Changed to Keflex orally.    Constipation Miralax and stool softeners.  Code Status: DNR DVT Prophylaxis: On warfarin/Heparin Family Communication: Pt and family in room Disposition Plan: PT/OT evaluation. Will likely return home when stable. Needs HH. Not ready for discharge.  Consultants:  Urology  IR  Procedures:  B nephrostomy tube placement per IR 03/24/13  TURBT 03/27/13  Antibiotics:  Rocephin 03/27/13>>>3/11  Kefelx 3/11-->  HPI/Subjective: Not voiding much urine. Had a BM yesterday. Feels tired when ambulates. Denies shortness of breath.   Objective: Filed Vitals:   03/30/13 0215 03/30/13 0427 03/30/13 0433 03/30/13 0508  BP: 115/52   124/57  Pulse: 80   76  Temp: 98.3 F (36.8 C)   98.2 F (36.8 C)  TempSrc: Oral   Oral  Resp: 18   18  Height:      Weight:    74 kg (163 lb 2.3 oz)  SpO2: 94% 90% 97% 98%    Intake/Output Summary (Last 24 hours) at 03/30/13 0952 Last data filed at 03/30/13 0700  Gross per 24 hour  Intake   2472 ml  Output    165 ml  Net   2307 ml   Filed Weights   03/26/13 0557 03/29/13 0528 03/30/13 0508  Weight: 72.6 kg (160 lb 0.9 oz) 72.7 kg (160 lb 4.4 oz) 74 kg (163 lb 2.3 oz)    Exam:  General:  Awake, in nad  Cardiovascular: regular, s1, s2. Edema noted bil LE.  Respiratory: normal resp effort, no wheezing. No crackles.  Abdomen: soft, nondistended. Mild vague tenderness in lower abdomen without rebound rigidity or guarding. BS present.  Musculoskeletal: perfused, no clubbing   Data Reviewed: Basic Metabolic Panel:  Recent Labs Lab  03/26/13 0321 03/27/13 0420 03/28/13 0455 03/29/13 0345 03/30/13 0350  NA 138 138 140 138 139  K 4.1 3.7 4.0 3.7 3.9  CL 105 103 104 103 106  CO2 20 20 19 20  17*  GLUCOSE 126* 66* 120* 105* 102*  BUN 41* 27* 27* 29* 28*  CREATININE 3.15* 1.96* 2.20* 2.31* 2.27*  CALCIUM 8.4 8.3* 9.0 8.8 8.7   CBC:  Recent Labs Lab 03/26/13 0321  03/26/13 2144 03/27/13 0420 03/28/13 0455 03/29/13 0345  03/30/13 0350  WBC 8.6  --   --  8.0 10.7* 8.3 7.7  HGB 6.7*  < > 8.4* 8.2* 9.2* 7.9* 7.3*  HCT 21.7*  < > 26.7* 25.4* 29.7* 26.3* 23.7*  MCV 85.4  --   --  84.1 86.3 86.5 85.9  PLT 381  --   --  389 372 308 281  < > = values in this interval not displayed.  CBG:  Recent Labs Lab 03/29/13 0729 03/29/13 1209 03/29/13 1734 03/29/13 2205 03/30/13 0755  GLUCAP 112* 157* 161* 137* 81    Recent Results (from the past 240 hour(s))  URINE CULTURE     Status: None   Collection Time    03/22/13  5:02 PM      Result Value Ref Range Status   Specimen Description URINE, CLEAN CATCH   Final   Special Requests NONE   Final   Culture  Setup Time     Final   Value: 03/23/2013 05:15     Performed at [place]     Final   Value: >=100,000 COLONIES/ML     Performed at [facility]   Culture     Final   Value: ESCHERICHIA COLI     Performed at [facility]   Report Status 03/25/2013 FINAL   Final   Organism ID, Bacteria ESCHERICHIA COLI   Final  MRSA PCR SCREENING     Status: None   Collection Time    03/24/13  2:05 PM      Result Value Ref Range Status   MRSA by PCR NEGATIVE  NEGATIVE Final   Comment:            The GeneXpert MRSA Assay (FDA     approved for NASAL specimens     only), is one component of a     comprehensive MRSA colonization     surveillance program. It is not     intended to diagnose MRSA     infection nor to guide or     monitor treatment for     MRSA infections.  SURGICAL PCR SCREEN     Status: Abnormal   Collection Time    03/27/13  3:52 AM      Result Value Ref Range Status   MRSA, PCR POSITIVE (*) NEGATIVE Final   Comment: RESULT CALLED TO, READ BACK BY AND VERIFIED WITH:     A. RODRIGUIZ RN AT 7035 ON 03.09.15 BY SHUEA   Staphylococcus aureus POSITIVE (*) NEGATIVE Final   Comment:            The Xpert SA Assay (FDA     approved for NASAL specimens     in patients over 12 years of age),     is one component  of     a comprehensive surveillance     program.  Test performance has     been validated by [employer] for  patients greater     than or equal to 20 year old.     It is not intended     to diagnose infection nor to     guide or monitor treatment.     Studies: No results found.  Scheduled Meds: . cephALEXin  500 mg Oral Q12H  . cloNIDine  0.1 mg Oral BID  . docusate sodium  100 mg Oral BID  . ezetimibe  10 mg Oral Daily  . feeding supplement (GLUCERNA SHAKE)  237 mL Oral BID BM  . ferrous sulfate  325 mg Oral Q breakfast  . insulin aspart  0-9 Units Subcutaneous TID WC  . insulin glargine  30 Units Subcutaneous QHS  . levothyroxine  112 mcg Oral QAC breakfast  . metoprolol succinate  50 mg Oral Daily  . polyethylene glycol  17 g Oral Daily  . warfarin  1 mg Oral ONCE-1800  . Warfarin - Pharmacist Dosing Inpatient   Does not apply q1800   Continuous Infusions: . sodium chloride 75 mL/hr at 03/29/13 0552  . lactated ringers      Principal Problem:   Acute-on-chronic kidney injury Active Problems:   History of pulmonary embolus (PE)   Diabetes mellitus   Hypertension   Open wound of abdominal wall without complication   Smoking   Obstructive uropathy   Bladder tumor   Bilateral hydronephrosis   Bladder cancer  Time spent: 10min  Aya Geisel  [facility] Pager [phone]  If 7PM-7AM, please contact night-coverage at [email or web address], password [facility] 03/30/2013, 9:52 AM  LOS: 8 days

## 2013-03-30 NOTE — Progress Notes (Signed)
Subjective: Pt feeling ok. Reports decreased output from drains, Pt and family report drains not being flushed.  Objective: Physical Exam: BP 124/57  Pulse 76  Temp(Src) 98.2 F (36.8 C) (Oral)  Resp 18  Ht 5\' 1"  (1.549 m)  Wt 163 lb 2.3 oz (74 kg)  BMI 30.84 kg/m2  SpO2 98% (L)drain intact, site clean, NT. Thin outpt-flushed easily with 5cc NS (R)drain with no output in bag. Flushed with 7cc sterile NS and aspirated. Returned dark blood tinged urine and several chunks of debris/clot.. Hooked back to gravity bag and immediate output of 277mL of dark blood tinged urine, flowing well.   Labs: CBC  Recent Labs  03/29/13 0345 03/30/13 0350  WBC 8.3 7.7  HGB 7.9* 7.3*  HCT 26.3* 23.7*  PLT 308 281   BMET  Recent Labs  03/29/13 0345 03/30/13 0350  NA 138 139  K 3.7 3.9  CL 103 106  CO2 20 17*  GLUCOSE 105* 102*  BUN 29* 28*  CREATININE 2.31* 2.27*  CALCIUM 8.8 8.7   LFT No results found for this basename: PROT, ALBUMIN, AST, ALT, ALKPHOS, BILITOT, BILIDIR, IBILI, LIPASE,  in the last 72 hours PT/INR  Recent Labs  03/29/13 0345 03/30/13 0350  LABPROT 16.4* 24.1*  INR 1.36 2.24*     Studies/Results: No results found.  Assessment/Plan: (B)hydronephrosis secondary to obstructive malignancy S/p (B)PCN Stressed importance of TID flushes to keep drains functioning properly. Cr stable.    LOS: 8 days    Ascencion Dike PA-C 03/30/2013 10:39 AM

## 2013-03-31 ENCOUNTER — Inpatient Hospital Stay (HOSPITAL_COMMUNITY): Payer: BC Managed Care – PPO

## 2013-03-31 LAB — PREPARE RBC (CROSSMATCH)

## 2013-03-31 LAB — CBC
HCT: 24.9 % — ABNORMAL LOW (ref 36.0–46.0)
Hemoglobin: 7.6 g/dL — ABNORMAL LOW (ref 12.0–15.0)
MCH: 26.4 pg (ref 26.0–34.0)
MCHC: 30.5 g/dL (ref 30.0–36.0)
MCV: 86.5 fL (ref 78.0–100.0)
PLATELETS: 277 10*3/uL (ref 150–400)
RBC: 2.88 MIL/uL — AB (ref 3.87–5.11)
RDW: 16.2 % — ABNORMAL HIGH (ref 11.5–15.5)
WBC: 8.4 10*3/uL (ref 4.0–10.5)

## 2013-03-31 LAB — GLUCOSE, CAPILLARY
Glucose-Capillary: 156 mg/dL — ABNORMAL HIGH (ref 70–99)
Glucose-Capillary: 139 mg/dL — ABNORMAL HIGH (ref 70–99)
Glucose-Capillary: 121 mg/dL — ABNORMAL HIGH (ref 70–99)

## 2013-03-31 LAB — PROTIME-INR
INR: 2.69 — AB (ref 0.00–1.49)
PROTHROMBIN TIME: 27.7 s — AB (ref 11.6–15.2)

## 2013-03-31 LAB — BASIC METABOLIC PANEL
BUN: 31 mg/dL — ABNORMAL HIGH (ref 6–23)
CALCIUM: 9 mg/dL (ref 8.4–10.5)
CO2: 19 meq/L (ref 19–32)
Chloride: 102 mEq/L (ref 96–112)
Creatinine, Ser: 2.39 mg/dL — ABNORMAL HIGH (ref 0.50–1.10)
GFR calc Af Amer: 22 mL/min — ABNORMAL LOW (ref >90)
GFR, EST NON AFRICAN AMERICAN: 19 mL/min — AB (ref >90)
Glucose, Bld: 106 mg/dL — ABNORMAL HIGH (ref 70–99)
Potassium: 4.1 mEq/L (ref 3.7–5.3)
SODIUM: 137 meq/L (ref 137–147)

## 2013-03-31 MED ORDER — POLYETHYLENE GLYCOL 3350 17 G PO PACK
17.0000 g | PACK | Freq: Two times a day (BID) | ORAL | Status: DC
  Administered 2013-03-31 – 2013-04-03 (×5): 17 g via ORAL
  Filled 2013-03-31 (×8): qty 1

## 2013-03-31 MED ORDER — WARFARIN SODIUM 1 MG PO TABS
1.0000 mg | ORAL_TABLET | Freq: Once | ORAL | Status: AC
  Administered 2013-03-31: 1 mg via ORAL
  Filled 2013-03-31: qty 1

## 2013-03-31 MED ORDER — FUROSEMIDE 10 MG/ML IJ SOLN
80.0000 mg | Freq: Once | INTRAMUSCULAR | Status: AC
  Administered 2013-04-01: 80 mg via INTRAVENOUS
  Filled 2013-03-31: qty 8

## 2013-03-31 NOTE — Progress Notes (Signed)
4 Days Post-Op Subjective: Patient reports being short of breath with minimal exertion. She is experiencing no pain. Catheterize urine yesterday revealed low volume, slightly bloody urine.  Objective: Vital signs in last 24 hours: Temp:  [98 F (36.7 C)-98.3 F (36.8 C)] 98 F (36.7 C) (03/13 0553) Pulse Rate:  [78-89] 79 (03/13 0553) Resp:  [18-20] 18 (03/13 0553) BP: (115-128)/(54-77) 124/54 mmHg (03/13 0553) SpO2:  [96 %-99 %] 98 % (03/13 0553) Weight:  [73 kg (160 lb 15 oz)] 73 kg (160 lb 15 oz) (03/13 0553)  Intake/Output from previous day: 03/12 0701 - 03/13 0700 In: 1310 [P.O.:600; I.V.:690] Out: 1525 [Urine:1525] Intake/Output this shift:    Physical Exam:  Constitutional: Vital signs reviewed. WD WN in NAD   Eyes: PERRL, No scleral icterus.   Cardiovascular: RRR Pulmonary/Chest: Normal effort Abdominal: Soft. Non-tender, non-distended, bowel sounds are normal, no masses, organomegaly, or guarding present.  Genitourinary: Extremities: 2 -3+ peripheral edema  Lab Results:  Recent Labs  03/29/13 0345 03/30/13 0350 03/31/13 0345  HGB 7.9* 7.3* 7.6*  HCT 26.3* 23.7* 24.9*   BMET  Recent Labs  03/30/13 0350 03/31/13 0345  NA 139 137  K 3.9 4.1  CL 106 102  CO2 17* 19  GLUCOSE 102* 106*  BUN 28* 31*  CREATININE 2.27* 2.39*  CALCIUM 8.7 9.0    Recent Labs  03/29/13 0345 03/30/13 0350 03/31/13 0345  INR 1.36 2.24* 2.69*   No results found for this basename: LABURIN,  in the last 72 hours Results for orders placed during the hospital encounter of 03/22/13  URINE CULTURE     Status: None   Collection Time    03/22/13  5:02 PM      Result Value Ref Range Status   Specimen Description URINE, CLEAN CATCH   Final   Special Requests NONE   Final   Culture  Setup Time     Final   Value: 03/23/2013 05:15     Performed at [place]     Final   Value: >=100,000 COLONIES/ML     Performed at [facility]   Culture      Final   Value: ESCHERICHIA COLI     Performed at [facility]   Report Status 03/25/2013 FINAL   Final   Organism ID, Bacteria ESCHERICHIA COLI   Final  MRSA PCR SCREENING     Status: None   Collection Time    03/24/13  2:05 PM      Result Value Ref Range Status   MRSA by PCR NEGATIVE  NEGATIVE Final   Comment:            The GeneXpert MRSA Assay (FDA     approved for NASAL specimens     only), is one component of a     comprehensive MRSA colonization     surveillance program. It is not     intended to diagnose MRSA     infection nor to guide or     monitor treatment for     MRSA infections.  SURGICAL PCR SCREEN     Status: Abnormal   Collection Time    03/27/13  3:52 AM      Result Value Ref Range Status   MRSA, PCR POSITIVE (*) NEGATIVE Final   Comment: RESULT CALLED TO, READ BACK BY AND VERIFIED WITH:     A. RODRIGUIZ RN AT 7846 ON 03.09.15 BY SHUEA   Staphylococcus aureus  POSITIVE (*) NEGATIVE Final   Comment:            The Xpert SA Assay (FDA     approved for NASAL specimens     in patients over 62 years of age),     is one component of     a comprehensive surveillance     program.  Test performance has     been validated by [employer] for patients greater     than or equal to 32 year old.     It is not intended     to diagnose infection nor to     guide or monitor treatment.    Studies/Results: No results found.  Assessment/Plan:   Postop day number for TURBT small but superficially invasive urothelial carcinoma. No significant bleeding.    Status post bilateral percutaneous nephrostomy tube placement for bilateral hydronephrosis secondary malignancy. Creatinine is fairly stable    Acute renal failure secondary to obstruction and medical issues. She has a bit more peripheral edema today and has some shortness of breath. Percutaneous tube seem to be draining well    I would leave nephrostomy tubes in for the present time. Certainly,  medical condition is somewhat poor, and is a focus of current hospitalization now. Will continue to follow, discharge for medical service.   LOS: 9 days   Franchot Gallo M 03/31/2013, 7:52 AM

## 2013-03-31 NOTE — Progress Notes (Signed)
Clinical Social Work Department BRIEF PSYCHOSOCIAL ASSESSMENT 03/31/2013  Patient:  Heather [name], Heather [name]     Account Number:  [id number]     Admit date:  03/22/2013  Clinical Social Worker:  Renold Genta  Date/Time:  03/31/2013 02:27 PM  Referred by:  Physician  Date Referred:  03/31/2013 Referred for  SNF Placement   Other Referral:   Interview type:  Patient Other interview type:   and husband at bedside    PSYCHOSOCIAL DATA Living Status:  HUSBAND Admitted from facility:   Level of care:   Primary support name:  Natahsa Marian (husband) ph#: [phone] Primary support relationship to patient:  SPOUSE Degree of support available:   good    CURRENT CONCERNS Current Concerns  Post-Acute Placement   Other Concerns:    SOCIAL WORK ASSESSMENT / PLAN CSW received consult from Dr. [place] Pink that patient will most likely need SNF at discharge. CSW reviewed PT/OT notes indicating SNF is now the recommended disposition.   Assessment/plan status:  Information/Referral to [employer] Other assessment/ plan:   Information/referral to community resources:   CSW completed FL2 and faxed information to [facility] - provided bed offers to patient & husband.    PATIENT'S/FAMILY'S RESPONSE TO PLAN OF CARE: Patient states that she had been to visit people at [facility] but has never been a patient there. Patient's first preference is [facility] though they are out of network with their insurance Nurse, mental health) so patient is agreeable with [facility]. CSW confirmed with Debbie @ [facility] that they are able to offer a bed and they have submitted for [employer] authorization.       Raynaldo Opitz, [facility] Clinical Social Worker cell #: [phone]

## 2013-03-31 NOTE — Progress Notes (Signed)
Clinical Social Work Department CLINICAL SOCIAL WORK PLACEMENT NOTE 03/31/2013  Patient:  Heather [name], Heather [name]  Account Number:  [id number] Admit date:  03/22/2013  Clinical Social Worker:  Renold Genta  Date/time:  03/31/2013 02:32 PM  Clinical Social Work is seeking post-discharge placement for this patient at the following level of care:   SKILLED NURSING   (*CSW will update this form in Epic as items are completed)   03/31/2013  Patient/family provided with [place] Department of Clinical Social Work's list of facilities offering this level of care within the geographic area requested by the patient (or if unable, by the patient's family).  03/31/2013  Patient/family informed of their freedom to choose among providers that offer the needed level of care, that participate in Medicare, Medicaid or managed care program needed by the patient, have an available bed and are willing to accept the patient.  03/31/2013  Patient/family informed of [facility]' ownership interest in [facility], as well as of the fact that they are under no obligation to receive care at this facility.  PASARR submitted to EDS on 03/31/2013 PASARR number received from EDS on 03/31/2013  FL2 transmitted to all facilities in geographic area requested by pt/family on  03/31/2013 FL2 transmitted to all facilities within larger geographic area on   Patient informed that his/her managed care company has contracts with or will negotiate with  certain facilities, including the following:   BCBS     Patient/family informed of bed offers received:  03/31/2013 Patient chooses bed at [facility] Physician recommends and patient chooses bed at    Patient to be transferred to [place] on   Patient to be transferred to facility by   The following physician request were entered in Epic:   Additional Comments: Raynaldo Opitz, [place]  Worker cell #: [phone]

## 2013-03-31 NOTE — Evaluation (Signed)
Occupational Therapy Evaluation Patient Details Name: Heather [name] MRN: [id number] DOB: April 24, 1940 Today's Date: 03/31/2013 Time: 9833-8250 OT Time Calculation (min): 53 min  OT Assessment / Plan / Recommendation History of present illness 73 yo female admitted with acute kidney injury. S/P transurethral resection of bladder tumor (TURBT) 3/9   Clinical Impression   Pt. Has decreased tolerance for activity and requires rest breaks after minimal exertion. Pt. Would benefit from ST SNF placement for rehab. Pt. Is an agreement to continue to work with OT to maximize performance and safety with ADLs and mobility.     OT Assessment  Patient needs continued OT Services    Follow Up Recommendations  SNF;Home health OT    Barriers to Discharge Decreased caregiver support    Equipment Recommendations  None recommended by OT    Recommendations for Other Services    Frequency  Min 2X/week    Precautions / Restrictions Precautions Precautions: Fall Restrictions Weight Bearing Restrictions: No   Pertinent Vitals/Pain No c/o    ADL  Eating/Feeding: Simulated;Independent Where Assessed - Eating/Feeding: Edge of bed Grooming: Wash/dry hands;Wash/dry face;Performed;Set up Where Assessed - Grooming: Unsupported sitting Upper Body Bathing: Simulated;Set up Where Assessed - Upper Body Bathing: Unsupported sitting Lower Body Bathing: Simulated;Minimal assistance Where Assessed - Lower Body Bathing: Unsupported sitting Upper Body Dressing: Performed;Set up Where Assessed - Upper Body Dressing: Unsupported sitting Lower Body Dressing: Performed;Maximal assistance Where Assessed - Lower Body Dressing: Unsupported sitting;Supported standing Toilet Transfer: Performed;Minimal assistance Toilet Transfer Method: Stand pivot Toilet Transfer Equipment: Bedside commode Toileting - Clothing Manipulation and Hygiene: Performed;Min guard Where Assessed - Toileting Clothing Manipulation and  Hygiene: Standing ADL Comments: Pt. is still having excessive swelling in legs and may need AE.     OT Diagnosis: Generalized weakness  OT Problem List: Decreased activity tolerance;Decreased knowledge of use of DME or AE OT Treatment Interventions: Self-care/ADL training;Neuromuscular education;DME and/or AE instruction;Therapeutic activities   OT Goals(Current goals can be found in the care plan section) Acute Rehab OT Goals Patient Stated Goal: home soon OT Goal Formulation: With patient Time For Goal Achievement: 04/14/13 Potential to Achieve Goals: Good  Visit Information  Last OT Received On: 03/31/13 Assistance Needed: +1 History of Present Illness: 73 yo female admitted with acute kidney injury. S/P transurethral resection of bladder tumor (TURBT) 3/9       Prior [place] expects to be discharged to:: Private residence Living Arrangements: Spouse/significant other;Children Type of Home: House Home Access: Stairs to enter Technical brewer of Steps: 4-front Entrance Stairs-Rails: Right Home Layout: One level [place] - single point;Shower seat;Bedside commode Prior Function Level of Independence: Independent with assistive device(s) Communication Communication: No difficulties Dominant Hand: Right         Vision/Perception Vision - History Baseline Vision: Wears glasses only for reading   Cognition  Cognition Arousal/Alertness: Awake/alert Behavior During Therapy: WFL for tasks assessed/performed Overall Cognitive Status: Within Functional Limits for tasks assessed    Extremity/Trunk Assessment Upper Extremity Assessment Upper Extremity Assessment: Overall WFL for tasks assessed     Mobility Bed Mobility Overal bed mobility: Needs Assistance Bed Mobility: Supine to Sit;Sit to Supine Supine to sit: Min assist Sit to supine: Min assist General bed mobility comments: Assist for LEs Transfers Overall  transfer level: Needs assistance Equipment used: Rolling walker (2 wheeled) Transfers: Stand Pivot Transfers Sit to Stand: Min assist     Exercise     Balance     End  of Session OT - End of Session Equipment Utilized During Treatment: Rolling walker Activity Tolerance: Patient tolerated treatment well Patient left: in bed Nurse Communication: Mobility status  GO     Heather [name] 03/31/2013, 7:59 AM

## 2013-03-31 NOTE — Progress Notes (Signed)
Subjective: Patient states she is doing ok, still no desire to eat. She admits to some tenderness at drain sites, denies any lightheadedness/dizziness.  Objective: Physical Exam: BP 133/58  Pulse 79  Temp(Src) 98 F (36.7 C) (Oral)  Resp 18  Ht 5\' 1"  (1.549 m)  Wt 160 lb 15 oz (73 kg)  BMI 30.42 kg/m2  SpO2 98%  Abd: Soft, NT, ND, (+) BS Right PCN: dressing C/D/I, output dark urine, no blood 725cc recorded 24 hr Left PCN : dressing C/D/I, output clear yellow urine, 800cc recorded 24 hr   Labs: CBC  Recent Labs  03/30/13 0350 03/31/13 0345  WBC 7.7 8.4  HGB 7.3* 7.6*  HCT 23.7* 24.9*  PLT 281 277   BMET  Recent Labs  03/30/13 0350 03/31/13 0345  NA 139 137  K 3.9 4.1  CL 106 102  CO2 17* 19  GLUCOSE 102* 106*  BUN 28* 31*  CREATININE 2.27* 2.39*  CALCIUM 8.7 9.0   LFT No results found for this basename: PROT, ALBUMIN, AST, ALT, ALKPHOS, BILITOT, BILIDIR, IBILI, LIPASE,  in the last 72 hours PT/INR  Recent Labs  03/30/13 0350 03/31/13 0345  LABPROT 24.1* 27.7*  INR 2.24* 2.69*     Studies/Results: Dg Chest Port 1 View  03/31/2013   CLINICAL DATA:  Shortness of breath  EXAM: PORTABLE CHEST - 1 VIEW  COMPARISON:  PA and lateral chest x-rays of March 22, 2013 and September 16, 2009  FINDINGS: The lungs are adequately inflated. The interstitial markings are chronically increased. The cardiac silhouette is normal in size. The pulmonary vascularity is not engorged. The mediastinum is normal in width. There is no pleural effusion. The observed portions of the bony thorax appear normal.  IMPRESSION: There is no evidence of pneumonia nor CHF nor other acute cardiopulmonary abnormality. There is stable mild prominence of the pulmonary interstitial markings.   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  Cr stable.   LOS: 9 days    Rockney Ghee 03/31/2013 12:15 PM

## 2013-03-31 NOTE — Progress Notes (Signed)
NUTRITION FOLLOW UP  Intervention:   -Continue with Glucerna shakes BID -Encouraged PO intake -Will continue to monitor  Nutrition Dx:   Inadequate oral intake related to decreased appetite as evidenced by patient report of PO intake.    Goal:   Pt to meet >/= 90% of their estimated nutrition needs    Monitor:   PO intake and supplement acceptance, I/Os, weight trends, labs   Assessment:   3/05: Patient reports that she has lost 5-6 pounds over the past couple of weeks, which is consistent with documented weight history. Patient has had a 3% weight loss since 2/16. Patient's son stated that the patient has been eating very small portions; for instance, at breakfast she might eat 1/2 piece of toast and 1/2 egg. Patient stated that she hasn't had an appetite for th past couple of weeks and she doesn't feel like eating. Patient reported drinking Glucerna Shakes at home. Dietetic intern encouraged patient to try to eat meals as able and to continue to drink Glucerna especially when intake is minimal.  Sodium low at 136.  Potassium elevated at 5.6.  Creatinine elevated at 3.79.  CBGs: 117, 142, 81  Nutrition Focused Physical Exam:  Subcutaneous Fat:  Orbital Region: WNL  Upper Arm Region: WNL  Thoracic and Lumbar Region: WNL  Muscle:  Temple Region: mild depletion  Clavicle Bone Region: mild depletion  Clavicle and Acromion Bone Region: mild depletion  Scapular Bone Region: WNL  Dorsal Hand: WNL  Patellar Region: WNL  Anterior Thigh Region: WNL  Posterior Calf Region: WNL  Edema: +1 RLE and LLE  3/13: -PO intake reported 40-100% -Pt noted that appetite is still slightly decreased, but enjoys Glucerna shakes. Has been drinking 50-100%. Family has also been providing pt with Glucerna shakes from outside sources, as well as snack bars as viewed in patients room during time of follow up -Has constipation-receiving Miralax and stool softener. This will also likely assist in  improving PO intake once pt able to have regular BMs -Pt feels that when she is able to be d/c and eat home made meals, her appetite will likely return. Wt fluctuations likely not r/t to PO intake as it ranges close to 50%   Height: Ht Readings from Last 1 Encounters:  03/22/13 5\' 1"  (1.549 m)    Weight Status:   Wt Readings from Last 1 Encounters:  03/31/13 160 lb 15 oz (73 kg)  03/22/13 146 lbs  Re-estimated needs:  Kcal: 1400-1600  Protein: 65-75 grams  Fluid: 1.2 L fluid restriction   Skin: no wounds  Diet Order: Diabetic   Intake/Output Summary (Last 24 hours) at 03/31/13 1339 Last data filed at 03/31/13 0651  Gross per 24 hour  Intake   1022 ml  Output   1175 ml  Net   -153 ml    Last BM: 3/11   Labs:   Recent Labs Lab 03/29/13 0345 03/30/13 0350 03/31/13 0345  NA 138 139 137  K 3.7 3.9 4.1  CL 103 106 102  CO2 20 17* 19  BUN 29* 28* 31*  CREATININE 2.31* 2.27* 2.39*  CALCIUM 8.8 8.7 9.0  GLUCOSE 105* 102* 106*    CBG (last 3)   Recent Labs  03/30/13 2106 03/31/13 0747 03/31/13 1136  GLUCAP 124* 156* 139*    Scheduled Meds: . cephALEXin  500 mg Oral Q12H  . Chlorhexidine Gluconate Cloth  6 each Topical Q0600  . cloNIDine  0.1 mg Oral BID  . docusate sodium  100 mg Oral BID  . ezetimibe  10 mg Oral Daily  . feeding supplement (GLUCERNA SHAKE)  237 mL Oral BID BM  . ferrous sulfate  325 mg Oral Q breakfast  . furosemide  80 mg Intravenous Once  . insulin aspart  0-9 Units Subcutaneous TID WC  . insulin glargine  30 Units Subcutaneous QHS  . levothyroxine  112 mcg Oral QAC breakfast  . metoprolol succinate  50 mg Oral Daily  . mupirocin ointment  1 application Nasal BID  . polyethylene glycol  17 g Oral BID  . warfarin  1 mg Oral ONCE-1800  . Warfarin - Pharmacist Dosing Inpatient   Does not apply q1800    Continuous Infusions: . sodium chloride 20 mL/hr at 03/31/13 1338  . lactated ringers      Atlee Abide MS RD LDN Clinical  Dietitian [phone]

## 2013-03-31 NOTE — Progress Notes (Signed)
[facility] PROGRESS NOTE  Heather [name] [id number] DOB: February 07, 1940 DOA: 03/22/2013  PCP: Rogene Houston, MD  Brief Summary 73yo who presents with B hydronephrosis secondary to bladder tumour. 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 INR was reversed and pt underwent B nephrostomy tube placement by IR. Pt then underwent TURBT by Urology on 03/27/13. OK to resume full dose anticoagulation per Urology. Pt's coumadin has been resumed 3/10 with heparin bridge. Renal fx had been improving since nephrostomy tube placement.  Assessment/Plan: Acute-on-chronic kidney injury  This is secondary to obstructive uropathy from bladder tumor. Renal function did improve from admission. Remains around 2-2.5. She likely has underlying CKD due to DM.  Has low UO. Bicarb low 3/12. Started on Sod Bicarb. Better now. Given IV Lasix 3/12 with goo output mainly from nephrostomy. Creat slightly higher.   Dyspnea Mainly with exertion. Does have a cough which is chronic as she is a smoker. Will get CXR. Dyspnea and weakness could be from anemia. If CXR is unremarkable, will transfuse PRBC.  Bladder Cancer She is s/p TURBT 3/9 and s/p nephrostomy tube placement on 3/6 with initial good improvement in renal fx. Urology following. Surgical pathology shows invasive urothelial carcinoma. Further management as OP per Urology.  Diabetes mellitus Type 2 Stable. Cont current regimen  Hypertension and chronic diastolic dysfunction  Continue clonidine and BB. BP well controlled.   Active smoking No wheezing currently. Cont duonebs PRN  Hypothyroidism  Continue Synthroid as tolerated  Acute Blood Loss Anemia Likely secondary to blood loss via nephrostomy tubes while on anticoagulation. Improved with recent PRBC transfusion. Some drop in Hgb noted. No further overt bleeding. Monitor for now.   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 Had been  maintained on chronic anticoagulation. Warfarin resumed 3/10. INR therapeutic. Heparin stopped. INR was reversed for procedures.   E Coli UTI Sensitive to Cefazolin. Changed to Keflex orally.   Constipation Miralax and stool softeners.  Code Status: DNR DVT Prophylaxis: On warfarin/Heparin Family Communication: Discussed with patient.  Disposition Plan: PT/OT evaluation. Might need SNF as she appears to be very deconditioned. Not ready for discharge.  Consultants:  Urology  IR  Procedures:  B nephrostomy tube placement per IR 03/24/13  TURBT 03/27/13  Antibiotics:  Rocephin 03/27/13>>>3/11  Kefelx 3/11-->  HPI/Subjective: Not voiding much urine. But nephrostomy output has picked up. Gets short of breat with minimal exertion. Denies CP.   Objective: Filed Vitals:   03/30/13 0508 03/30/13 1425 03/30/13 2101 03/31/13 0553  BP: 124/57 115/77 128/66 124/54  Pulse: 76 78 89 79  Temp: 98.2 F (36.8 C) 98 F (36.7 C) 98.3 F (36.8 C) 98 F (36.7 C)  TempSrc: Oral Oral Oral Oral  Resp: 18 20 18 18   Height:      Weight: 74 kg (163 lb 2.3 oz)   73 kg (160 lb 15 oz)  SpO2: 98% 96% 99% 98%    Intake/Output Summary (Last 24 hours) at 03/31/13 0902 Last data filed at 03/31/13 0651  Gross per 24 hour  Intake   1310 ml  Output   1525 ml  Net   -215 ml   Filed Weights   03/29/13 0528 03/30/13 0508 03/31/13 0553  Weight: 72.7 kg (160 lb 4.4 oz) 74 kg (163 lb 2.3 oz) 73 kg (160 lb 15 oz)    Exam:  General:  Awake, in nad  Cardiovascular: regular,  s1, s2. Edema noted bil LE.  Respiratory: normal resp effort, no wheezing. No crackles.  Abdomen: soft, nondistended. Mild vague tenderness in lower abdomen without rebound rigidity or guarding. BS present.  Musculoskeletal: perfused, no clubbing   Data Reviewed: Basic Metabolic Panel:  Recent Labs Lab 03/27/13 0420 03/28/13 0455 03/29/13 0345 03/30/13 0350 03/31/13 0345  NA 138 140 138 139 137  K 3.7 4.0 3.7 3.9  4.1  CL 103 104 103 106 102  CO2 20 19 20  17* 19  GLUCOSE 66* 120* 105* 102* 106*  BUN 27* 27* 29* 28* 31*  CREATININE 1.96* 2.20* 2.31* 2.27* 2.39*  CALCIUM 8.3* 9.0 8.8 8.7 9.0   CBC:  Recent Labs Lab 03/27/13 0420 03/28/13 0455 03/29/13 0345 03/30/13 0350 03/31/13 0345  WBC 8.0 10.7* 8.3 7.7 8.4  HGB 8.2* 9.2* 7.9* 7.3* 7.6*  HCT 25.4* 29.7* 26.3* 23.7* 24.9*  MCV 84.1 86.3 86.5 85.9 86.5  PLT 389 372 308 281 277    CBG:  Recent Labs Lab 03/30/13 0755 03/30/13 1151 03/30/13 1722 03/30/13 2106 03/31/13 0747  GLUCAP 81 136* 129* 124* 156*    Recent Results (from the past 240 hour(s))  URINE CULTURE     Status: None   Collection Time    03/22/13  5:02 PM      Result Value Ref Range Status   Specimen Description URINE, CLEAN CATCH   Final   Special Requests NONE   Final   Culture  Setup Time     Final   Value: 03/23/2013 05:15     Performed at [place]     Final   Value: >=100,000 COLONIES/ML     Performed at [facility]   Culture     Final   Value: ESCHERICHIA COLI     Performed at [facility]   Report Status 03/25/2013 FINAL   Final   Organism ID, Bacteria ESCHERICHIA COLI   Final  MRSA PCR SCREENING     Status: None   Collection Time    03/24/13  2:05 PM      Result Value Ref Range Status   MRSA by PCR NEGATIVE  NEGATIVE Final   Comment:            The GeneXpert MRSA Assay (FDA     approved for NASAL specimens     only), is one component of a     comprehensive MRSA colonization     surveillance program. It is not     intended to diagnose MRSA     infection nor to guide or     monitor treatment for     MRSA infections.  SURGICAL PCR SCREEN     Status: Abnormal   Collection Time    03/27/13  3:52 AM      Result Value Ref Range Status   MRSA, PCR POSITIVE (*) NEGATIVE Final   Comment: RESULT CALLED TO, READ BACK BY AND VERIFIED WITH:     A. RODRIGUIZ RN AT 3235 ON 03.09.15 BY SHUEA   Staphylococcus  aureus POSITIVE (*) NEGATIVE Final   Comment:            The Xpert SA Assay (FDA     approved for NASAL specimens     in patients over 14 years of age),     is one component of     a comprehensive surveillance     program.  Test performance has  been validated by [facility] for patients greater     than or equal to 68 year old.     It is not intended     to diagnose infection nor to     guide or monitor treatment.     Studies: No results found.  Scheduled Meds: . cephALEXin  500 mg Oral Q12H  . Chlorhexidine Gluconate Cloth  6 each Topical Q0600  . cloNIDine  0.1 mg Oral BID  . docusate sodium  100 mg Oral BID  . ezetimibe  10 mg Oral Daily  . feeding supplement (GLUCERNA SHAKE)  237 mL Oral BID BM  . ferrous sulfate  325 mg Oral Q breakfast  . insulin aspart  0-9 Units Subcutaneous TID WC  . insulin glargine  30 Units Subcutaneous QHS  . levothyroxine  112 mcg Oral QAC breakfast  . metoprolol succinate  50 mg Oral Daily  . mupirocin ointment  1 application Nasal BID  . polyethylene glycol  17 g Oral Daily  . Warfarin - Pharmacist Dosing Inpatient   Does not apply q1800   Continuous Infusions: . sodium chloride 20 mL/hr at 03/31/13 0025  . lactated ringers      Principal Problem:   Acute-on-chronic kidney injury Active Problems:   History of pulmonary embolus (PE)   Diabetes mellitus   Hypertension   Open wound of abdominal wall without complication   Smoking   Obstructive uropathy   Bladder tumor   Bilateral hydronephrosis   Bladder cancer  Time spent: 47min  Heather [name]  [facility] Pager [phone]  If 7PM-7AM, please contact night-coverage at [email or web address], password [facility] 03/31/2013, 9:02 AM  LOS: 9 days

## 2013-03-31 NOTE — Progress Notes (Signed)
ANTICOAGULATION CONSULT NOTE - Follow Up Consult  Pharmacy Consult for warfarin Indication: hx of VTE  Allergies  Allergen Reactions  . Motrin [Ibuprofen] Rash  . Sulfa Drugs Cross Reactors Rash  . Iohexol      Code: HIVES, Desc: pt states she has dye allergy, Onset Date: [id number]   . Quinine Derivatives Rash    Patient Measurements: Height: 5\' 1"  (154.9 cm) Weight: 160 lb 15 oz (73 kg) IBW/kg (Calculated) : 47.8   Vital Signs: Temp: 98 F (36.7 C) (03/13 0553) Temp src: Oral (03/13 0553) BP: 133/58 mmHg (03/13 1038) Pulse Rate: 79 (03/13 0553)  Labs:  Recent Labs  03/29/13 0345 03/29/13 1108 03/29/13 1900 03/30/13 0350 03/31/13 0345  HGB 7.9*  --   --  7.3* 7.6*  HCT 26.3*  --   --  23.7* 24.9*  PLT 308  --   --  281 277  LABPROT 16.4*  --   --  24.1* 27.7*  INR 1.36  --   --  2.24* 2.69*  HEPARINUNFRC  --  0.36 0.33 0.38  --   CREATININE 2.31*  --   --  2.27* 2.39*    Estimated Creatinine Clearance: 18.9 ml/min (by C-G formula based on Cr of 2.39).   Medications:  Scheduled:  . cephALEXin  500 mg Oral Q12H  . Chlorhexidine Gluconate Cloth  6 each Topical Q0600  . cloNIDine  0.1 mg Oral BID  . docusate sodium  100 mg Oral BID  . ezetimibe  10 mg Oral Daily  . feeding supplement (GLUCERNA SHAKE)  237 mL Oral BID BM  . ferrous sulfate  325 mg Oral Q breakfast  . furosemide  80 mg Intravenous Once  . insulin aspart  0-9 Units Subcutaneous TID WC  . insulin glargine  30 Units Subcutaneous QHS  . levothyroxine  112 mcg Oral QAC breakfast  . metoprolol succinate  50 mg Oral Daily  . mupirocin ointment  1 application Nasal BID  . polyethylene glycol  17 g Oral BID  . Warfarin - Pharmacist Dosing Inpatient   Does not apply q1800   Infusions:  . sodium chloride 20 mL/hr at 03/31/13 0025  . 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   Warfarin therapy was interrupted on admission, a total of 4 units FFP was administered for INR reversal, and IV heparin was used for bridging.   At midnight 3/8 heparin was discontinued and on 3/9 patient underwent TURBT. Orders were received today from Dr. Wyline Copas to resume warfarin along with IV heparin bridge until INR therapeutic, with pharmacy dosing assistance for anticoagulants.  He has already discussed the case with urologist and obtained clearance to proceed with this. Heparin dosing records from earlier this admission were reviewed.  Patient's dosage requirements were variable and larger than predicted for age and weight.     IV heparin stopped 3/12 with therapeutic INR  INR therapeutic and still rising after receiving only 2 doses of 4mg  and 1mg  x 1  Stable Plts, Hgb low but stable - no overt signs of bleeding and no bleeding per discussion with patient  Goal of Therapy:  INR 2-3   Plan:  1) Will repeat small dose of 1mg  of warfarin tonight 2) Daily INR and CBC  Adrian Saran, PharmD, BCPS Pager [phone] 03/31/2013 12:56 PM

## 2013-04-01 LAB — GLUCOSE, CAPILLARY
GLUCOSE-CAPILLARY: 142 mg/dL — AB (ref 70–99)
GLUCOSE-CAPILLARY: 183 mg/dL — AB (ref 70–99)
GLUCOSE-CAPILLARY: 164 mg/dL — AB (ref 70–99)
Glucose-Capillary: 126 mg/dL — ABNORMAL HIGH (ref 70–99)

## 2013-04-01 LAB — BASIC METABOLIC PANEL
BUN: 36 mg/dL — AB (ref 6–23)
CO2: 13 mEq/L — ABNORMAL LOW (ref 19–32)
CREATININE: 2.26 mg/dL — AB (ref 0.50–1.10)
Calcium: 9.3 mg/dL (ref 8.4–10.5)
Chloride: 105 mEq/L (ref 96–112)
GFR calc Af Amer: 23 mL/min — ABNORMAL LOW (ref >90)
GFR, EST NON AFRICAN AMERICAN: 20 mL/min — AB (ref >90)
Glucose, Bld: 136 mg/dL — ABNORMAL HIGH (ref 70–99)
POTASSIUM: 5.5 meq/L — AB (ref 3.7–5.3)
Sodium: 138 mEq/L (ref 137–147)

## 2013-04-01 LAB — CBC
HEMATOCRIT: 36.3 % (ref 36.0–46.0)
Hemoglobin: 12 g/dL (ref 12.0–15.0)
MCH: 28 pg (ref 26.0–34.0)
MCHC: 33.1 g/dL (ref 30.0–36.0)
MCV: 84.8 fL (ref 78.0–100.0)
PLATELETS: 232 10*3/uL (ref 150–400)
RBC: 4.28 MIL/uL (ref 3.87–5.11)
RDW: 15.5 % (ref 11.5–15.5)
WBC: 11.7 10*3/uL — AB (ref 4.0–10.5)

## 2013-04-01 LAB — PROTIME-INR
INR: 2.82 — ABNORMAL HIGH (ref 0.00–1.49)
Prothrombin Time: 28.7 seconds — ABNORMAL HIGH (ref 11.6–15.2)

## 2013-04-01 MED ORDER — FUROSEMIDE 40 MG PO TABS
60.0000 mg | ORAL_TABLET | Freq: Every day | ORAL | Status: DC
  Administered 2013-04-01 – 2013-04-03 (×3): 60 mg via ORAL
  Filled 2013-04-01 (×3): qty 1

## 2013-04-01 MED ORDER — SODIUM POLYSTYRENE SULFONATE 15 GM/60ML PO SUSP
15.0000 g | Freq: Once | ORAL | Status: AC
  Administered 2013-04-01: 15 g via ORAL
  Filled 2013-04-01: qty 60

## 2013-04-01 MED ORDER — WARFARIN SODIUM 2 MG PO TABS
2.0000 mg | ORAL_TABLET | Freq: Once | ORAL | Status: AC
  Administered 2013-04-01: 2 mg via ORAL
  Filled 2013-04-01: qty 1

## 2013-04-01 NOTE — Progress Notes (Signed)
ANTICOAGULATION CONSULT NOTE - Follow Up Consult  Pharmacy Consult for warfarin Indication: hx of VTE  Allergies  Allergen Reactions  . Motrin [Ibuprofen] Rash  . Sulfa Drugs Cross Reactors Rash  . Iohexol      Code: HIVES, Desc: pt states she has dye allergy, Onset Date: 16109604   . Quinine Derivatives Rash    Patient Measurements: Height: 5\' 1"  (154.9 cm) Weight: 165 lb 5.5 oz (75 kg) IBW/kg (Calculated) : 47.8   Vital Signs: Temp: 98 F (36.7 C) (03/14 0615) Temp src: Oral (03/14 0615) BP: 124/59 mmHg (03/14 0615) Pulse Rate: 83 (03/14 0615)  Labs:  Recent Labs  03/29/13 1108 03/29/13 1900  03/30/13 0350 03/31/13 0345 04/01/13 0907  HGB  --   --   < > 7.3* 7.6* 12.0  HCT  --   --   --  23.7* 24.9* 36.3  PLT  --   --   --  281 277 232  LABPROT  --   --   --  24.1* 27.7*  --   INR  --   --   --  2.24* 2.69*  --   HEPARINUNFRC 0.36 0.33  --  0.38  --   --   CREATININE  --   --   --  2.27* 2.39*  --   < > = values in this interval not displayed.  Estimated Creatinine Clearance: 19.1 ml/min (by C-G formula based on Cr of 2.39).   Medications:  Scheduled:  . cephALEXin  500 mg Oral Q12H  . Chlorhexidine Gluconate Cloth  6 each Topical Q0600  . cloNIDine  0.1 mg Oral BID  . docusate sodium  100 mg Oral BID  . ezetimibe  10 mg Oral Daily  . feeding supplement (GLUCERNA SHAKE)  237 mL Oral BID BM  . ferrous sulfate  325 mg Oral Q breakfast  . insulin aspart  0-9 Units Subcutaneous TID WC  . insulin glargine  30 Units Subcutaneous QHS  . levothyroxine  112 mcg Oral QAC breakfast  . metoprolol succinate  50 mg Oral Daily  . mupirocin ointment  1 application Nasal BID  . polyethylene glycol  17 g Oral BID  . Warfarin - Pharmacist Dosing Inpatient   Does not apply q1800   Infusions:  . sodium chloride 20 mL/hr at 03/31/13 1338  . 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   Warfarin therapy was interrupted on admission, a total of 4 units FFP was administered for INR reversal, and IV heparin was used for bridging.   At midnight 3/8 heparin was discontinued and on 3/9 patient underwent TURBT. Orders were received today from Dr. Wyline Copas to resume warfarin along with IV heparin bridge until INR therapeutic, with pharmacy dosing assistance for anticoagulants.  He has already discussed the case with urologist and obtained clearance to proceed with this. Heparin dosing records from earlier this admission were reviewed.  Patient's dosage requirements were variable and larger than predicted for age and weight.     IV heparin stopped 3/12 with therapeutic INR  INR 2.82, doses thus far: 4mg , 4 mg, 1mg , and 1mg .  INR increased today but not as quickly as the past several days, beginning to see effect of smaller doses.  Home warfarin dose = 3 mg daily  Plts 232K, Hgb 12.0 after 2 units of Hgb.  No bleeding reported.  Goal of Therapy:  INR 2-3   Plan:  1) Warfarin 2 mg tonight. 2) Daily INR.  Hershal Coria, PharmD, BCPS Pager: [phone] 04/01/2013 10:22 AM

## 2013-04-01 NOTE — Progress Notes (Signed)
5 Days Post-Op  Subjective: B PCNs placed 3/6 Doing well Feeling well  Objective: Vital signs in last 24 hours: Temp:  [97 F (36.1 C)-98.6 F (37 C)] 98 F (36.7 C) (03/14 0615) Pulse Rate:  [77-88] 83 (03/14 0615) Resp:  [16-18] 18 (03/14 0615) BP: (84-168)/(50-91) 124/59 mmHg (03/14 0615) SpO2:  [93 %-97 %] 94 % (03/14 0615) Weight:  [75 kg (165 lb 5.5 oz)] 75 kg (165 lb 5.5 oz) (03/14 0615) Last BM Date: 03/29/13  Intake/Output from previous day: 03/13 0701 - 03/14 0700 In: 1088 [P.O.:240; I.V.:223; Blood:625] Out: 800 [Urine:800] Intake/Output this shift:    PE:  Afeb; vss Alert B PCNs intact Rt: 450 cc yesterday; 300 cc in bag now- clear; straw color Lt: 350 cc yesterday; 200 cc in bag now- clear straw color Sites are clean and dry; NT Bun/Cr 31/2.39 3/13   Lab Results:   Recent Labs  03/30/13 0350 03/31/13 0345  WBC 7.7 8.4  HGB 7.3* 7.6*  HCT 23.7* 24.9*  PLT 281 277   BMET  Recent Labs  03/30/13 0350 03/31/13 0345  NA 139 137  K 3.9 4.1  CL 106 102  CO2 17* 19  GLUCOSE 102* 106*  BUN 28* 31*  CREATININE 2.27* 2.39*  CALCIUM 8.7 9.0   PT/INR  Recent Labs  03/30/13 0350 03/31/13 0345  LABPROT 24.1* 27.7*  INR 2.24* 2.69*   ABG No results found for this basename: PHART, PCO2, PO2, HCO3,  in the last 72 hours  Studies/Results: Dg Chest Port 1 View  03/31/2013   CLINICAL DATA:  Shortness of breath  EXAM: PORTABLE CHEST - 1 VIEW  COMPARISON:  PA and lateral chest x-rays of March 22, 2013 and September 16, 2009  FINDINGS: The lungs are adequately inflated. The interstitial markings are chronically increased. The cardiac silhouette is normal in size. The pulmonary vascularity is not engorged. The mediastinum is normal in width. There is no pleural effusion. The observed portions of the bony thorax appear normal.  IMPRESSION: There is no evidence of pneumonia nor CHF nor other acute cardiopulmonary abnormality. There is stable mild prominence of  the pulmonary interstitial markings.   Electronically Signed   By: David  Martinique   On: 03/31/2013 09:08    Anti-infectives: Anti-infectives   Start     Dose/Rate Route Frequency Ordered Stop   03/29/13 1000  cephALEXin (KEFLEX) capsule 500 mg     500 mg Oral Every 12 hours 03/29/13 0907     03/28/13 1200  cefTRIAXone (ROCEPHIN) 1 g in dextrose 5 % 50 mL IVPB  Status:  Discontinued     1 g 100 mL/hr over 30 Minutes Intravenous Every 24 hours 03/26/13 1209 03/29/13 0907   03/27/13 1200  cefTRIAXone (ROCEPHIN) 1 g in dextrose 5 % 50 mL IVPB  Status:  Discontinued     1 g 100 mL/hr over 30 Minutes Intravenous On call to O.R. 03/26/13 1209 03/27/13 0538   03/27/13 1200  [MAR Hold]  cefTRIAXone (ROCEPHIN) 1 g in dextrose 5 % 50 mL IVPB     (On MAR Hold since 03/27/13 1244)  Comments:  TO BE ADMINISTERED BY CRNA IN O.R. HOLDING AREA   1 g 100 mL/hr over 30 Minutes Intravenous On call to O.R. 03/27/13 0538 03/27/13 1335   03/25/13 1600  cefTRIAXone (ROCEPHIN) 1 g in dextrose 5 % 50 mL IVPB     1 g 100 mL/hr over 30 Minutes Intravenous Every 24 hours 03/25/13 1509 03/26/13 1630  03/24/13 1600  ceFAZolin (ANCEF) IVPB 2 g/50 mL premix     2 g 100 mL/hr over 30 Minutes Intravenous  Once 03/24/13 1552 03/24/13 1630      Assessment/Plan: s/p Procedure(s): TRANSURETHRAL RESECTION OF BLADDER TUMOR (TURBT) (N/A) CYSTOSCOPY RETROGRADE PYELOGRAM POSSIBLE URETEROSCOPY (N/A)  B PCNs placed secondary hydro 3/6 Following with Urology Output great Plan per Uro   LOS: 10 days    Murna Backer A 04/01/2013

## 2013-04-01 NOTE — Progress Notes (Signed)
[facility] PROGRESS NOTE  Heather [name] IRS:[id number] DOB: Feb 04, 1940 DOA: 03/22/2013  PCP: Rogene Houston, MD  Brief Summary 73yo who presents with B hydronephrosis secondary to bladder tumour. 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 INR was reversed and pt underwent B nephrostomy tube placement by IR. Pt then underwent TURBT by Urology on 03/27/13. OK to resume full dose anticoagulation per Urology. Pt's coumadin has been resumed 3/10 with heparin bridge. Renal fx had been improving since nephrostomy tube placement.  Assessment/Plan: Acute-on-chronic kidney injury  This is secondary to obstructive uropathy from bladder tumor. Renal function did improve from admission. Remains around 2-2.5. She likely has underlying CKD due to DM.  Has low UO. Bicarb low 3/12. Started on Sod Bicarb. Better now. Given IV Lasix 3/12 with good output mainly from nephrostomy. Creat slightly higher 3/13. Pending today.  Dyspnea Mainly with exertion. Does have a cough which is chronic as she is a smoker. CXR was negative for pneumonia or CHF. Dyspnea and weakness most likely anemia. She was transfused PRBC's yesterday.  Bladder Cancer She is s/p TURBT 3/9 and s/p nephrostomy tube placement on 3/6 with initial good improvement in renal fx. Urology following. Surgical pathology shows invasive urothelial carcinoma. Further management as OP per Urology.  Diabetes mellitus Type 2 Stable. Cont current regimen  Hypertension and chronic diastolic dysfunction  Continue clonidine and BB. BP well controlled.   Active smoking No wheezing currently. Cont duonebs PRN  Hypothyroidism  Continue Synthroid as tolerated  Acute Blood Loss Anemia Likely secondary to blood loss via nephrostomy tubes while on anticoagulation. Improved with recent PRBC transfusion. Some drop in Hgb noted. No further overt bleeding. Transfused 2 more units 3/13 due to weakness.   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 Had been maintained on chronic anticoagulation. Warfarin resumed 3/10. INR therapeutic. Heparin stopped. INR was reversed for procedures earlier this admission.   E Coli UTI Sensitive to Cefazolin. Now on Keflex orally.   Constipation Miralax and stool softeners.  Code Status: DNR DVT Prophylaxis: On warfarin/Heparin Family Communication: Discussed with patient.  Disposition Plan: PT/OT evaluation. SNF on 3/16 if she remains medically stable. Not ready for discharge. Await labs from today.  Consultants:  Urology  IR  Procedures:  B nephrostomy tube placement per IR 03/24/13  TURBT 03/27/13  Antibiotics:  Rocephin 03/27/13>>>3/11  Kefelx 3/11-->  HPI/Subjective: Patient feels better but still weak. No new complaints.  Objective: Filed Vitals:   04/01/13 0315 04/01/13 0415 04/01/13 0515 04/01/13 0615  BP: 123/57 135/55 139/59 124/59  Pulse: 86 88 81 83  Temp: 97.8 F (36.6 C) 98.3 F (36.8 C) 98 F (36.7 C) 98 F (36.7 C)  TempSrc: Oral Oral Oral Oral  Resp: 18 18 18 18   Height:      Weight:    75 kg (165 lb 5.5 oz)  SpO2: 94% 94% 95% 94%    Intake/Output Summary (Last 24 hours) at 04/01/13 0855 Last data filed at 04/01/13 0615  Gross per 24 hour  Intake   1088 ml  Output    800 ml  Net    288 ml   Filed Weights   03/30/13 0508 03/31/13 0553 04/01/13 0615  Weight: 74 kg (163 lb 2.3 oz) 73 kg (160 lb 15 oz) 75 kg (165 lb 5.5 oz)    Exam:  General:  Awake, in nad  Cardiovascular: regular, s1,  s2. Edema noted bil LE.  Respiratory: normal resp effort, no wheezing. No crackles.  Abdomen: soft, nondistended. Mild vague tenderness in lower abdomen without rebound rigidity or guarding. BS present.  Musculoskeletal: perfused, no clubbing   Data Reviewed: Basic Metabolic Panel:  Recent Labs Lab 03/27/13 0420 03/28/13 0455 03/29/13 0345 03/30/13 0350 03/31/13 0345  NA 138 140 138 139 137  K 3.7 4.0 3.7  3.9 4.1  CL 103 104 103 106 102  CO2 20 19 20  17* 19  GLUCOSE 66* 120* 105* 102* 106*  BUN 27* 27* 29* 28* 31*  CREATININE 1.96* 2.20* 2.31* 2.27* 2.39*  CALCIUM 8.3* 9.0 8.8 8.7 9.0   CBC:  Recent Labs Lab 03/27/13 0420 03/28/13 0455 03/29/13 0345 03/30/13 0350 03/31/13 0345  WBC 8.0 10.7* 8.3 7.7 8.4  HGB 8.2* 9.2* 7.9* 7.3* 7.6*  HCT 25.4* 29.7* 26.3* 23.7* 24.9*  MCV 84.1 86.3 86.5 85.9 86.5  PLT 389 372 308 281 277    CBG:  Recent Labs Lab 03/30/13 2106 03/31/13 0747 03/31/13 1136 03/31/13 2208 04/01/13 0754  GLUCAP 124* 156* 139* 121* 126*    Recent Results (from the past 240 hour(s))  URINE CULTURE     Status: None   Collection Time    03/22/13  5:02 PM      Result Value Ref Range Status   Specimen Description URINE, CLEAN CATCH   Final   Special Requests NONE   Final   Culture  Setup Time     Final   Value: 03/23/2013 05:15     Performed at [facility]     Final   Value: >=100,000 COLONIES/ML     Performed at [facility]   Culture     Final   Value: ESCHERICHIA COLI     Performed at [facility]   Report Status 03/25/2013 FINAL   Final   Organism ID, Bacteria ESCHERICHIA COLI   Final  MRSA PCR SCREENING     Status: None   Collection Time    03/24/13  2:05 PM      Result Value Ref Range Status   MRSA by PCR NEGATIVE  NEGATIVE Final   Comment:            The GeneXpert MRSA Assay (FDA     approved for NASAL specimens     only), is one component of a     comprehensive MRSA colonization     surveillance program. It is not     intended to diagnose MRSA     infection nor to guide or     monitor treatment for     MRSA infections.  SURGICAL PCR SCREEN     Status: Abnormal   Collection Time    03/27/13  3:52 AM      Result Value Ref Range Status   MRSA, PCR POSITIVE (*) NEGATIVE Final   Comment: RESULT CALLED TO, READ BACK BY AND VERIFIED WITH:     A. RODRIGUIZ RN AT [id number] ON 03.09.15 BY SHUEA    Staphylococcus aureus POSITIVE (*) NEGATIVE Final   Comment:            The Xpert SA Assay (FDA     approved for NASAL specimens     in patients over 41 years of age),     is one component of     a comprehensive surveillance     program.  Test performance has  been validated by [employer] for patients greater     than or equal to 62 year old.     It is not intended     to diagnose infection nor to     guide or monitor treatment.     Studies: Dg Chest Port 1 View  03/31/2013   CLINICAL DATA:  Shortness of breath  EXAM: PORTABLE CHEST - 1 VIEW  COMPARISON:  PA and lateral chest x-rays of March 22, 2013 and September 16, 2009  FINDINGS: The lungs are adequately inflated. The interstitial markings are chronically increased. The cardiac silhouette is normal in size. The pulmonary vascularity is not engorged. The mediastinum is normal in width. There is no pleural effusion. The observed portions of the bony thorax appear normal.  IMPRESSION: There is no evidence of pneumonia nor CHF nor other acute cardiopulmonary abnormality. There is stable mild prominence of the pulmonary interstitial markings.   Electronically Signed   By: David  Martinique   On: 03/31/2013 09:08    Scheduled Meds: . cephALEXin  500 mg Oral Q12H  . Chlorhexidine Gluconate Cloth  6 each Topical Q0600  . cloNIDine  0.1 mg Oral BID  . docusate sodium  100 mg Oral BID  . ezetimibe  10 mg Oral Daily  . feeding supplement (GLUCERNA SHAKE)  237 mL Oral BID BM  . ferrous sulfate  325 mg Oral Q breakfast  . insulin aspart  0-9 Units Subcutaneous TID WC  . insulin glargine  30 Units Subcutaneous QHS  . levothyroxine  112 mcg Oral QAC breakfast  . metoprolol succinate  50 mg Oral Daily  . mupirocin ointment  1 application Nasal BID  . polyethylene glycol  17 g Oral BID  . Warfarin - Pharmacist Dosing Inpatient   Does not apply q1800   Continuous Infusions: . sodium chloride 20 mL/hr at 03/31/13 1338  . lactated ringers       Principal Problem:   Acute-on-chronic kidney injury Active Problems:   History of pulmonary embolus (PE)   Diabetes mellitus   Hypertension   Open wound of abdominal wall without complication   Smoking   Obstructive uropathy   Bladder tumor   Bilateral hydronephrosis   Bladder cancer  Time spent: 35 min  [facility] Pager [phone]  If 7PM-7AM, please contact night-coverage at [email or web address], password [facility] 04/01/2013, 8:55 AM  LOS: 10 days

## 2013-04-02 LAB — TYPE AND SCREEN
ABO/RH(D): O POS
ANTIBODY SCREEN: NEGATIVE
UNIT DIVISION: 0
Unit division: 0

## 2013-04-02 LAB — GLUCOSE, CAPILLARY
GLUCOSE-CAPILLARY: 160 mg/dL — AB (ref 70–99)
Glucose-Capillary: 112 mg/dL — ABNORMAL HIGH (ref 70–99)
Glucose-Capillary: 146 mg/dL — ABNORMAL HIGH (ref 70–99)
Glucose-Capillary: 177 mg/dL — ABNORMAL HIGH (ref 70–99)

## 2013-04-02 LAB — BASIC METABOLIC PANEL
BUN: 38 mg/dL — ABNORMAL HIGH (ref 6–23)
CHLORIDE: 101 meq/L (ref 96–112)
CO2: 18 mEq/L — ABNORMAL LOW (ref 19–32)
Calcium: 8.9 mg/dL (ref 8.4–10.5)
Creatinine, Ser: 2.32 mg/dL — ABNORMAL HIGH (ref 0.50–1.10)
GFR calc Af Amer: 23 mL/min — ABNORMAL LOW (ref >90)
GFR, EST NON AFRICAN AMERICAN: 20 mL/min — AB (ref >90)
Glucose, Bld: 106 mg/dL — ABNORMAL HIGH (ref 70–99)
Potassium: 3.9 mEq/L (ref 3.7–5.3)
SODIUM: 138 meq/L (ref 137–147)

## 2013-04-02 LAB — PROTIME-INR
INR: 3.1 — ABNORMAL HIGH (ref 0.00–1.49)
Prothrombin Time: 30.8 seconds — ABNORMAL HIGH (ref 11.6–15.2)

## 2013-04-02 MED ORDER — SODIUM BICARBONATE 650 MG PO TABS
650.0000 mg | ORAL_TABLET | Freq: Three times a day (TID) | ORAL | Status: DC
  Administered 2013-04-02 – 2013-04-03 (×4): 650 mg via ORAL
  Filled 2013-04-02 (×6): qty 1

## 2013-04-02 MED ORDER — CLONIDINE HCL 0.1 MG PO TABS
0.1000 mg | ORAL_TABLET | Freq: Every day | ORAL | Status: DC
  Administered 2013-04-03: 0.1 mg via ORAL
  Filled 2013-04-02: qty 1

## 2013-04-02 NOTE — Progress Notes (Signed)
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 INR was reversed and pt underwent B nephrostomy tube placement by IR. Pt then underwent TURBT by Urology on 03/27/13. OK to resume full dose anticoagulation per Urology. Pt's coumadin has been resumed 3/10 with heparin bridge. Renal fx had been improving since nephrostomy tube placement.  Assessment/Plan: Acute-on-chronic kidney injury  This was secondary to obstructive uropathy from bladder tumor. Renal function has improved from admission. Creatinine remains around 2-2.5. She likely has underlying CKD due to DM.  Has low UO. Bicarb low but stable. Started on Sod Bicarb. Given IV Lasix 3/12 with good output mainly from nephrostomy. Continue oral Lasix. PCP to arrange Nephrology referral as OP.  Dyspnea Mainly with exertion. Does have a cough which is chronic as she is a smoker. CXR was negative for pneumonia or CHF. Dyspnea and weakness most likely anemia. She was transfused PRBC's with only partial improvement. I think deconditioning is playing a major role.  Bladder Cancer She is s/p TURBT 3/9 and s/p nephrostomy tube placement on 3/6 with initial good improvement in renal fx. Urology following. Surgical pathology shows invasive urothelial carcinoma. Further management as OP per Urology. PCN draining well.  Diabetes mellitus Type 2 Stable. Cont current regimen  Hypertension and chronic diastolic dysfunction  Continue clonidine and BB. BP low normal. Since she is on daily lasix as well, will cut back on clonidine.  Active smoking NoT wheezing currently. Cont duonebs PRN  Hypothyroidism  Continue Synthroid as tolerated  Acute Blood Loss Anemia Resolved. Was likely secondary to blood loss via nephrostomy tubes while on anticoagulation.  Improved with recent PRBC transfusion. No further overt bleeding. Transfused 2 more units 3/13 due to weakness.   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 Had been maintained on chronic anticoagulation. Warfarin resumed 3/10. INR therapeutic. Heparin stopped. INR was reversed for procedures earlier this admission.   E Coli UTI Sensitive to Cefazolin. Now on Keflex orally.   Constipation Miralax and stool softeners.  Code Status: DNR DVT Prophylaxis: On warfarin/Heparin Family Communication: Discussed with patient, son and husband. Disposition Plan: SNF on 3/16 if she remains medically stable.   Consultants:  Urology  IR  Procedures:  B nephrostomy tube placement per IR 03/24/13  TURBT 03/27/13  Antibiotics:  Rocephin 03/27/13>>>3/11  Kefelx 3/11-->  HPI/Subjective: Patient feels better but still weak. No new complaints.  Objective: Filed Vitals:   04/01/13 0615 04/01/13 1400 04/01/13 2015 04/02/13 0553  BP: 124/59 122/50 136/55 117/47  Pulse: 83 77 76 85  Temp: 98 F (36.7 C) 97.8 F (36.6 C) 97.7 F (36.5 C) 97.8 F (36.6 C)  TempSrc: Oral Oral Oral Oral  Resp: 18 20 19 18   Height:      Weight: 75 kg (165 lb 5.5 oz)   75.2 kg (165 lb 12.6 oz)  SpO2: 94% 95% 93% 95%    Intake/Output Summary (Last 24 hours) at 04/02/13 1118 Last data filed at 04/01/13 2145  Gross per 24 hour  Intake    710 ml  Output   1000 ml  Net   -290 ml   Filed Weights   03/31/13 0553 04/01/13 0615 04/02/13 0553  Weight: 73 kg (160 lb 15 oz) 75 kg (  165 lb 5.5 oz) 75.2 kg (165 lb 12.6 oz)    Exam:  General:  Awake, in nad  Cardiovascular: regular, s1, s2. Edema noted bil LE.  Respiratory: normal resp effort, no wheezing. No crackles.  Abdomen: soft, nondistended. Mild vague tenderness in lower abdomen without rebound rigidity or guarding. BS present.  Musculoskeletal: perfused, no clubbing   Data Reviewed: Basic Metabolic  Panel:  Recent Labs Lab 03/29/13 0345 03/30/13 0350 03/31/13 0345 04/01/13 0907 04/02/13 0423  NA 138 139 137 138 138  K 3.7 3.9 4.1 5.5* 3.9  CL 103 106 102 105 101  CO2 20 17* 19 13* 18*  GLUCOSE 105* 102* 106* 136* 106*  BUN 29* 28* 31* 36* 38*  CREATININE 2.31* 2.27* 2.39* 2.26* 2.32*  CALCIUM 8.8 8.7 9.0 9.3 8.9   CBC:  Recent Labs Lab 03/28/13 0455 03/29/13 0345 03/30/13 0350 03/31/13 0345 04/01/13 0907  WBC 10.7* 8.3 7.7 8.4 11.7*  HGB 9.2* 7.9* 7.3* 7.6* 12.0  HCT 29.7* 26.3* 23.7* 24.9* 36.3  MCV 86.3 86.5 85.9 86.5 84.8  PLT 372 308 281 277 232    CBG:  Recent Labs Lab 04/01/13 0754 04/01/13 1148 04/01/13 1732 04/01/13 2111 04/02/13 0726  GLUCAP 126* 142* 183* 164* 112*    Recent Results (from the past 240 hour(s))  MRSA PCR SCREENING     Status: None   Collection Time    03/24/13  2:05 PM      Result Value Ref Range Status   MRSA by PCR NEGATIVE  NEGATIVE Final   Comment:            The GeneXpert MRSA Assay (FDA     approved for NASAL specimens     only), is one component of a     comprehensive MRSA colonization     surveillance program. It is not     intended to diagnose MRSA     infection nor to guide or     monitor treatment for     MRSA infections.  SURGICAL PCR SCREEN     Status: Abnormal   Collection Time    03/27/13  3:52 AM      Result Value Ref Range Status   MRSA, PCR POSITIVE (*) NEGATIVE Final   Comment: RESULT CALLED TO, READ BACK BY AND VERIFIED WITH:     A. RODRIGUIZ RN AT 5400 ON 03.09.15 BY SHUEA   Staphylococcus aureus POSITIVE (*) NEGATIVE Final   Comment:            The Xpert SA Assay (FDA     approved for NASAL specimens     in patients over 50 years of age),     is one component of     a comprehensive surveillance     program.  Test performance has     been validated by [employer] for patients greater     than or equal to 10 year old.     It is not intended     to diagnose infection nor to      guide or monitor treatment.     Studies: No results found.  Scheduled Meds: . cephALEXin  500 mg Oral Q12H  . Chlorhexidine Gluconate Cloth  6 each Topical Q0600  . cloNIDine  0.1 mg Oral BID  . docusate sodium  100 mg Oral BID  . ezetimibe  10 mg Oral Daily  . feeding supplement (GLUCERNA SHAKE)  237 mL Oral BID BM  .  ferrous sulfate  325 mg Oral Q breakfast  . furosemide  60 mg Oral Daily  . insulin aspart  0-9 Units Subcutaneous TID WC  . insulin glargine  30 Units Subcutaneous QHS  . levothyroxine  112 mcg Oral QAC breakfast  . metoprolol succinate  50 mg Oral Daily  . mupirocin ointment  1 application Nasal BID  . polyethylene glycol  17 g Oral BID  . sodium bicarbonate  650 mg Oral TID  . Warfarin - Pharmacist Dosing Inpatient   Does not apply q1800   Continuous Infusions: . sodium chloride 20 mL/hr at 03/31/13 1338  . lactated ringers      Principal Problem:   Acute-on-chronic kidney injury Active Problems:   History of pulmonary embolus (PE)   Diabetes mellitus   Hypertension   Open wound of abdominal wall without complication   Smoking   Obstructive uropathy   Bladder tumor   Bilateral hydronephrosis   Bladder cancer  Time spent: 35 min  [facility] Pager [phone]  If 7PM-7AM, please contact night-coverage at [email or web address], password [facility] 04/02/2013, 11:18 AM  LOS: 11 days

## 2013-04-02 NOTE — Progress Notes (Signed)
ANTICOAGULATION CONSULT NOTE - Follow Up Consult  Pharmacy Consult for warfarin Indication: hx of VTE  Allergies  Allergen Reactions  . Motrin [Ibuprofen] Rash  . Sulfa Drugs Cross Reactors Rash  . Iohexol      Code: HIVES, Desc: pt states she has dye allergy, Onset Date: [id number]   . Quinine Derivatives Rash    Patient Measurements: Height: 5\' 1"  (154.9 cm) Weight: 165 lb 12.6 oz (75.2 kg) IBW/kg (Calculated) : 47.8   Vital Signs: Temp: 97.8 F (36.6 C) (03/15 0553) Temp src: Oral (03/15 0553) BP: 117/47 mmHg (03/15 0553) Pulse Rate: 85 (03/15 0553)  Labs:  Recent Labs  03/31/13 0345 04/01/13 0907 04/01/13 0956 04/02/13 0423  HGB 7.6* 12.0  --   --   HCT 24.9* 36.3  --   --   PLT 277 232  --   --   LABPROT 27.7*  --  28.7* 30.8*  INR 2.69*  --  2.82* 3.10*  CREATININE 2.39* 2.26*  --  2.32*    Estimated Creatinine Clearance: 19.7 ml/min (by C-G formula based on Cr of 2.32).   Medications:  Scheduled:  . cephALEXin  500 mg Oral Q12H  . Chlorhexidine Gluconate Cloth  6 each Topical Q0600  . cloNIDine  0.1 mg Oral BID  . docusate sodium  100 mg Oral BID  . ezetimibe  10 mg Oral Daily  . feeding supplement (GLUCERNA SHAKE)  237 mL Oral BID BM  . ferrous sulfate  325 mg Oral Q breakfast  . furosemide  60 mg Oral Daily  . insulin aspart  0-9 Units Subcutaneous TID WC  . insulin glargine  30 Units Subcutaneous QHS  . levothyroxine  112 mcg Oral QAC breakfast  . metoprolol succinate  50 mg Oral Daily  . mupirocin ointment  1 application Nasal BID  . polyethylene glycol  17 g Oral BID  . Warfarin - Pharmacist Dosing Inpatient   Does not apply q1800   Infusions:  . sodium chloride 20 mL/hr at 03/31/13 1338  . 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   Warfarin therapy was interrupted on admission, a total of 4 units FFP was administered for INR reversal, and IV heparin was used for bridging.   At midnight 3/8 heparin was discontinued and on 3/9 patient underwent TURBT. Orders were received today from Dr. Wyline Copas to resume warfarin along with IV heparin bridge until INR therapeutic, with pharmacy dosing assistance for anticoagulants.  He has already discussed the case with urologist and obtained clearance to proceed with this. Heparin dosing records from earlier this admission were reviewed.  Patient's dosage requirements were variable and larger than predicted for age and weight.     IV heparin stopped 3/12 with therapeutic INR  INR 3.10, doses thus far: 4mg , 4 mg, 1mg , 1mg , 2mg .     Home warfarin dose = 3 mg daily  3/14 CBC: Plts 232K, Hgb 12.0 after 2 units of Hgb 3/13 (transfused d/t to drop in Hgb with patient weakness).  Pt experienced blood loss via nephrostomy tubes on admission.  No further overt bleeding reported.  Goal of Therapy:  INR 2-3   Plan:  1) Hold warfarin today. 2) Daily INR.  Hershal Coria, PharmD, BCPS Pager: [phone] 04/02/2013 8:17  AM     

## 2013-04-03 ENCOUNTER — Other Ambulatory Visit: Payer: Self-pay

## 2013-04-03 LAB — PROTIME-INR
INR: 2.78 — ABNORMAL HIGH (ref 0.00–1.49)
Prothrombin Time: 28.4 seconds — ABNORMAL HIGH (ref 11.6–15.2)

## 2013-04-03 LAB — BASIC METABOLIC PANEL
BUN: 36 mg/dL — ABNORMAL HIGH (ref 6–23)
CO2: 23 mEq/L (ref 19–32)
Calcium: 9.1 mg/dL (ref 8.4–10.5)
Chloride: 98 mEq/L (ref 96–112)
Creatinine, Ser: 1.85 mg/dL — ABNORMAL HIGH (ref 0.50–1.10)
GFR, EST AFRICAN AMERICAN: 30 mL/min — AB (ref >90)
GFR, EST NON AFRICAN AMERICAN: 26 mL/min — AB (ref >90)
Glucose, Bld: 121 mg/dL — ABNORMAL HIGH (ref 70–99)
POTASSIUM: 3.6 meq/L — AB (ref 3.7–5.3)
SODIUM: 138 meq/L (ref 137–147)

## 2013-04-03 LAB — GLUCOSE, CAPILLARY
GLUCOSE-CAPILLARY: 153 mg/dL — AB (ref 70–99)
Glucose-Capillary: 119 mg/dL — ABNORMAL HIGH (ref 70–99)

## 2013-04-03 LAB — TROPONIN I: Troponin I: <0.3 ng/mL (ref <0.30)

## 2013-04-03 MED ORDER — SODIUM BICARBONATE 650 MG PO TABS: 650.0000 mg | ORAL_TABLET | Freq: Two times a day (BID) | ORAL | Status: DC

## 2013-04-03 MED ORDER — HYDROCODONE-ACETAMINOPHEN 5-325 MG PO TABS: 1.0000 | ORAL_TABLET | ORAL | Status: AC | PRN

## 2013-04-03 MED ORDER — WARFARIN SODIUM 1 MG PO TABS: 2.0000 mg | ORAL_TABLET | Freq: Every day | ORAL | Status: DC

## 2013-04-03 MED ORDER — DSS 100 MG PO CAPS: 100.0000 mg | ORAL_CAPSULE | Freq: Two times a day (BID) | ORAL | Status: DC

## 2013-04-03 MED ORDER — FUROSEMIDE 40 MG PO TABS: 40.0000 mg | ORAL_TABLET | Freq: Every day | ORAL | Status: DC

## 2013-04-03 MED ORDER — WARFARIN SODIUM 2 MG PO TABS
2.0000 mg | ORAL_TABLET | Freq: Once | ORAL | Status: DC
  Filled 2013-04-03: qty 1

## 2013-04-03 MED ORDER — POTASSIUM CHLORIDE CRYS ER 20 MEQ PO TBCR
20.0000 meq | EXTENDED_RELEASE_TABLET | Freq: Once | ORAL | Status: AC
  Administered 2013-04-03: 20 meq via ORAL
  Filled 2013-04-03: qty 1

## 2013-04-03 MED ORDER — ACETAMINOPHEN 325 MG PO TABS: 650.0000 mg | ORAL_TABLET | Freq: Four times a day (QID) | ORAL | Status: AC | PRN

## 2013-04-03 MED ORDER — CLONIDINE HCL 0.1 MG PO TABS: 0.1000 mg | ORAL_TABLET | Freq: Every day | ORAL | Status: DC

## 2013-04-03 MED ORDER — CEPHALEXIN 500 MG PO CAPS: 500.0000 mg | ORAL_CAPSULE | Freq: Two times a day (BID) | ORAL | Status: DC

## 2013-04-03 MED ORDER — GLUCERNA SHAKE PO LIQD: 237.0000 mL | Freq: Two times a day (BID) | ORAL | Status: DC

## 2013-04-03 MED ORDER — POTASSIUM CHLORIDE 20 MEQ PO PACK: 20.0000 meq | PACK | Freq: Every day | ORAL | Status: DC

## 2013-04-03 MED ORDER — POLYETHYLENE GLYCOL 3350 17 G PO PACK: 17.0000 g | PACK | Freq: Two times a day (BID) | ORAL | Status: DC

## 2013-04-03 MED ORDER — IPRATROPIUM-ALBUTEROL 0.5-2.5 (3) MG/3ML IN SOLN: 3.0000 mL | RESPIRATORY_TRACT | Status: AC | PRN

## 2013-04-03 NOTE — Progress Notes (Signed)
[facility] progress note. Chief complaint. Chest pain. History of present illness. This 73 year old female in hospital acute on chronic kidney injury secondary to obstructive uropathy from bladder tumor. Patient status post nephrostomy tube placement by interventional radiology. This morning the patient complains of right-sided chest pain. A 12-lead EKG was obtained and it does show flipped T waves in several of the V leads though this does not appear new compared to prior EKG. I see no indication of acute ischemia. By the time I saw patient at bedside pain had resolved. Nursing indicates this pain resolution occurred following  flush of nephrostomy tube. Patient describes pain as stabbing with no radiation, no diaphoresis, or no nausea. Vital signs. Temperature 98.4, pulse 100, respiration 18, blood pressure 153/67. General appearance. Frail elderly female who is alert and in no distress. Cardiac. Rate and rhythm regular. Lungs. Breath sounds clear and equal. Abdomen. Soft with positive bowel sounds. Diffuse pain with palpation. Musculoskeletal. In part pain reproducible with palpitation under the right breast. Impression/plan. Problem #1. Chest pain. I suspect the etiology may be pain associated with nephrostomy tube. Patient currently pain-free. I will obtain troponin now and then every 6 hours for a total of 3 sets. We'll also repeat an EKG in 6 hours to evaluate for any changes. Nursing will notify me the patient complains of any further chest pain.

## 2013-04-03 NOTE — Progress Notes (Signed)
Patient is set to discharge to [facility] today. [employer] authorization obtained. Patient & husband at bedside aware. Discharge packet given to RN, Gregary Signs. [facility] scheduled for 2:45p pickup.    Clinical Social Work Department CLINICAL SOCIAL WORK PLACEMENT NOTE 04/03/2013  Patient:  Heather [name], Heather [name]  Account Number:  [id number] Admit date:  03/22/2013  Clinical Social Worker:  Renold Genta  Date/time:  03/31/2013 02:32 PM  Clinical Social Work is seeking post-discharge placement for this patient at the following level of care:   SKILLED NURSING   (*CSW will update this form in Epic as items are completed)   03/31/2013  Patient/family provided with [place] Department of Clinical Social Work's list of facilities offering this level of care within the geographic area requested by the patient (or if unable, by the patient's family).  03/31/2013  Patient/family informed of their freedom to choose among providers that offer the needed level of care, that participate in Medicare, Medicaid or managed care program needed by the patient, have an available bed and are willing to accept the patient.  03/31/2013  Patient/family informed of [facility]' ownership interest in [facility], as well as of the fact that they are under no obligation to receive care at this facility.  PASARR submitted to EDS on 03/31/2013 PASARR number received from EDS on 03/31/2013  FL2 transmitted to all facilities in geographic area requested by pt/family on  03/31/2013 FL2 transmitted to all facilities within larger geographic area on   Patient informed that his/her managed care company has contracts with or will negotiate with  certain facilities, including the following:   [employer]BS     Patient/family informed of bed offers received:  03/31/2013 Patient chooses bed at [place] Physician recommends and patient chooses bed at    Patient to be transferred to [place] on  04/03/2013 Patient to be transferred to facility by [facility]  The following physician request were entered in Epic:   Additional Comments:   Heather [name], [place] Worker cell #: [phone]

## 2013-04-03 NOTE — Progress Notes (Addendum)
Pt awoken from sleep with dull intense pain to the anterior rt chest wall. 8/10 in severity. Pt denies experiencing pain of this nature previously and is asymptomatic, denies nausea and palpitations, no visible diaphoresis. Vitals obtained and NP on call notified. EKG performed per orders given and added to chart. NP came to floor to examine pt and further orders given. Pt denies chest pain after being repositioned in bed and neph. tubes flushed. Will continue to monitor pt.  Filed Vitals:   04/03/13 0300  BP: 153/67  Pulse: 100  Temp:   Resp:

## 2013-04-03 NOTE — Progress Notes (Signed)
Physical Therapy Treatment Patient Details Name: DRISHTI PEPPERMAN MRN: [id number] DOB: [birth year] Today's Date: 04/03/2013 Time: 6384-6659 PT Time Calculation (min): 18 min  PT Assessment / Plan / Recommendation  History of Present Illness 73 yo female admitted with acute kidney injury. S/P transurethral resection of bladder tumor (TURBT) 3/9   PT Comments   Pt continues to require Min-Mod assist for mobility. Pt is deconditioned. Fatigues easily with minimal activity. LE swelling does not appear to have decreased any since last PT visit. At this time, feel pt will need ST rehab at SNF to improve strength, activity tolerance, and gait/balance.   Follow Up Recommendations  SNF;Supervision/Assistance - 24 hour     Does the patient have the potential to tolerate intense rehabilitation     Barriers to Discharge        Equipment Recommendations  Rolling walker with 5" wheels;Wheelchair (measurements PT);Wheelchair cushion (measurements PT)    Recommendations for Other Services    Frequency Min 3X/week   Progress towards PT Goals Progress towards PT goals: Progressing toward goals (slowly)  Plan Discharge plan needs to be updated    Precautions / Restrictions Precautions Precautions: Fall Precaution Comments: bil neph drains Restrictions Weight Bearing Restrictions: No   Pertinent Vitals/Pain Abdominal pain/discomfort-unrated    Mobility  Bed Mobility Overal bed mobility: Needs Assistance Bed Mobility: Supine to Sit;Sit to Supine Supine to sit: HOB elevated;Min assist Sit to supine: Mod assist General bed mobility comments: assist for bil LEs off/onto bed. Increased time. Heavy reliance on bedrails Transfers Equipment used: Rolling walker (2 wheeled) Transfers: Sit to/from Stand Sit to Stand: Min assist General transfer comment: Assist to rise, stabilize, control descent. VCs safety, technique, hand placement.  Ambulation/Gait Ambulation/Gait assistance: Min  assist Ambulation Distance (Feet): 20 Feet Assistive device: Rolling walker (2 wheeled) Gait Pattern/deviations: Step-through pattern;Decreased stride length;Trunk flexed;Wide base of support General Gait Details: slow gait speed. fatigues easily. dyspnea 2/4. Asssist to stabilize pt and maneuver with walker.     Exercises General Exercises - Lower Extremity Ankle Circles/Pumps: AROM;Both;10 reps;Seated Long Arc Quad: AAROM;Both;10 reps;Seated   PT Diagnosis:    PT Problem List:   PT Treatment Interventions:     PT Goals (current goals can now be found in the care plan section)    Visit Information  Last PT Received On: 04/03/13 Assistance Needed: +1 History of Present Illness: 73 yo female admitted with acute kidney injury. S/P transurethral resection of bladder tumor (TURBT) 3/9    Subjective Data      Cognition  Cognition Arousal/Alertness: Awake/alert Behavior During Therapy: WFL for tasks assessed/performed Overall Cognitive Status: Within Functional Limits for tasks assessed    Balance     End of Session PT - End of Session Activity Tolerance: Patient limited by fatigue Patient left: with call bell/phone within reach;with family/visitor present   GP     Weston Anna, MPT Pager: [phone]

## 2013-04-03 NOTE — Discharge Summary (Signed)
[facility]  Physician Discharge Summary   Patient ID: Heather [name] MRN: [id number] DOB/AGE: January 24, 1940 73 y.o.  Admit date: 03/22/2013 Discharge date: 04/03/2013  PCP: Rogene Houston, MD  DISCHARGE DIAGNOSES:  Principal Problem:   Acute-on-chronic kidney injury Active Problems:   History of pulmonary embolus (PE)   Diabetes mellitus   Hypertension   Open wound of abdominal wall without complication   Smoking   Obstructive uropathy   Bladder tumor   Bilateral hydronephrosis   Bladder cancer   RECOMMENDATIONS FOR SNF: 1. Bmet in 3 days 2. Nephrostomy tube care as outlined. 3. Needs referral to Nephrology for CKD. 4. Check CBG's three times daily and cover per SNF protocol. 5. PT/INR per SNF protocol. Maintain INR between 2-3.  DISCHARGE CONDITION: fair  Diet recommendation: Mod Carb  Filed Weights   04/01/13 0615 04/02/13 0553 04/03/13 0617  Weight: 75 kg (165 lb 5.5 oz) 75.2 kg (165 lb 12.6 oz) 74.9 kg (165 lb 2 oz)    INITIAL HISTORY: 72yo who presents with B hydronephrosis secondary to bladder tumour. 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 INR was reversed and pt underwent B nephrostomy tube placement by IR. Pt then underwent TURBT by Urology on 03/27/13. OK to resume full dose anticoagulation per Urology. Pt's coumadin has been resumed 3/10 with heparin bridge. Renal fx had been improving since nephrostomy tube placement.  Consultants:  Urology  IR  Procedures:  B nephrostomy tube placement per IR 03/24/13  TURBT 03/27/13  Antibiotics:  Rocephin 03/27/13>>>3/11  Kefelx 3/11-->   HOSPITAL COURSE:   Acute-on-chronic kidney injury  This was secondary to obstructive uropathy from bladder tumor. Renal function has improved from admission after obstruction was relieved with nephrostomy tubes. Creatinine remains around 2 but down to 1.8 this morning. She likely has underlying CKD due to DM. Bicarb low and so was started on Sod Bicarb and now it is  improved. Given IV Lasix 3/12 with good output mainly from nephrostomy. Continue oral Lasix. PCP to arrange Nephrology referral as OP. Monitor renal function and electrolytes at SNF.  Dyspnea  This is mainly with exertion. Does have a cough which is chronic as she is a smoker. CXR was negative for pneumonia or CHF. Dyspnea and weakness most likely from deconditioning. Initially it was felt anemia could be contributing and so she was transfused with not much improvement. She will need rehab.  Bladder Cancer  She is s/p TURBT 3/9 and s/p nephrostomy tube placement on 3/6 with initial good improvement in renal fx. Urology following. Surgical pathology shows invasive urothelial carcinoma. Further management as OP per Urology. PCN draining well. Will go to SNF with nephrostomy tubes. Further management of tubes per Urology.  Diabetes mellitus Type 2  Stable. Cont current regimen with Lantus.  Hypertension and chronic diastolic dysfunction  Continue clonidine and BB. Also on Lasix for pedal edema and CKD. Monitor BP closely and adjust medications as needed.   Active smoking  Not wheezing currently. Cont duonebs PRN   Hypothyroidism  Continue Synthroid  Acute Blood Loss Anemia  Resolved. Was likely secondary to blood loss via nephrostomy tubes while on anticoagulation. Improved with PRBC transfusion. No further overt bleeding. Transfused 2 more units 3/13 due to weakness.   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 Had been maintained on chronic anticoagulation. Warfarin resumed 3/10. INR therapeutic. Heparin stopped. INR was reversed for procedures  earlier this admission.   E Coli UTI  Sensitive to Cefazolin. Now on Keflex orally.   Constipation  Miralax and stool softeners. Utilize enemas if no BM.  Transient Right Sided Chest Pain early morning on 3/16 Pain resolved in 10 minutes after nephrostomy tube was flushed. It was sharp i  nature. EKG reviewed and similar to previous with probably more pronounced T wave changes. Troponin is normal. Pain was most likely pleuritic and probably related to the nephrostomy tube. VTE is unlikely as she is therapeutic with coumadin. Repeat one more trop level and if negative, will not require further work up.   Overall stable. She is ready for SNF. Patient agreeable and she was reassured.   PERTINENT LABS: The results of significant diagnostics from this hospitalization (including imaging, microbiology, ancillary and laboratory) are listed below for reference.    Microbiology: Recent Results (from the past 240 hour(s))  MRSA PCR SCREENING     Status: None   Collection Time    03/24/13  2:05 PM      Result Value Ref Range Status   MRSA by PCR NEGATIVE  NEGATIVE Final   Comment:            The GeneXpert MRSA Assay (FDA     approved for NASAL specimens     only), is one component of a     comprehensive MRSA colonization     surveillance program. It is not     intended to diagnose MRSA     infection nor to guide or     monitor treatment for     MRSA infections.  SURGICAL PCR SCREEN     Status: Abnormal   Collection Time    03/27/13  3:52 AM      Result Value Ref Range Status   MRSA, PCR POSITIVE (*) NEGATIVE Final   Comment: RESULT CALLED TO, READ BACK BY AND VERIFIED WITH:     A. RODRIGUIZ RN AT [id number] ON 03.09.15 BY SHUEA   Staphylococcus aureus POSITIVE (*) NEGATIVE Final   Comment:            The Xpert SA Assay (FDA     approved for NASAL specimens     in patients over 58 years of age),     is one component of     a comprehensive surveillance     program.  Test performance has     been validated by [employer] for patients greater     than or equal to 53 year old.     It is not intended     to diagnose infection nor to     guide or monitor treatment.     Labs: Basic Metabolic Panel:  Recent Labs Lab 03/30/13 0350 03/31/13 0345 04/01/13 0907  04/02/13 0423 04/03/13 0630  NA 139 137 138 138 138  K 3.9 4.1 5.5* 3.9 3.6*  CL 106 102 105 101 98  CO2 17* 19 13* 18* 23  GLUCOSE 102* 106* 136* 106* 121*  BUN 28* 31* 36* 38* 36*  CREATININE 2.27* 2.39* 2.26* 2.32* 1.85*  CALCIUM 8.7 9.0 9.3 8.9 9.1   CBC:  Recent Labs Lab 03/28/13 0455 03/29/13 0345 03/30/13 0350 03/31/13 0345 04/01/13 0907  WBC 10.7* 8.3 7.7 8.4 11.7*  HGB 9.2* 7.9* 7.3* 7.6* 12.0  HCT 29.7* 26.3* 23.7* 24.9* 36.3  MCV 86.3 86.5 85.9 86.5 84.8  PLT 372 308 281 277 232   Cardiac Enzymes:  Recent Labs Lab 04/03/13 0450  TROPONINI <0.30   BNP: BNP (last 3 results)  Recent Labs  03/03/13 2334  PROBNP 731.0*   CBG:  Recent Labs Lab 04/02/13 0726 04/02/13 1217 04/02/13 1712 04/02/13 2147 04/03/13 0755  GLUCAP 112* 146* 177* 160* 119*     IMAGING STUDIES Dg Chest 2 View  03/22/2013   CLINICAL DATA:  Wheezing, shortness of breath  EXAM: CHEST  2 VIEW  COMPARISON:  CT ABD/PELV WO CM dated 03/09/2013; DG CHEST 2 VIEW dated 03/03/2013  FINDINGS: There is mild bilateral interstitial prominence, likely chronic. There is no focal parenchymal opacity, pleural effusion, or pneumothorax. The heart and mediastinal contours are unremarkable.  The osseous structures are unremarkable.  IMPRESSION: There is mild chronic bilateral interstitial thickening.  No active cardiopulmonary disease.   Electronically Signed   By: Kathreen Devoid   On: 03/22/2013 20:13   US Renal  03/22/2013   CLINICAL DATA:  Acute kidney injury. Chronic kidney disease. Bilateral hydronephrosis. Bladder tumor.  EXAM: RENAL/URINARY TRACT ULTRASOUND COMPLETE  COMPARISON:  CT ABD/PELV WO CM dated 03/09/2013; DG CHEST 2 VIEW dated 03/22/2013  FINDINGS: Right Kidney:  Length: 11.5 cm. Severe hydronephrosis. Dilation of the renal pelvis extends into the proximal ureter. If probably no interval change compared to prior CT.  Left Kidney:  Length: 9.4 cm. Moderate hydronephrosis extending in the anatomic  pelvis. Hydroureter is present.  Bladder:  Echogenic mass is present limb posterior urinary bladder with calcifications. Findings are suggestive of transitional cell carcinoma. This measures 21 mm and correlates well with would mass in the bladder seen on prior CT.  IMPRESSION: 1. Severe right and moderate left hydronephrosis. Bilateral hydroureter. 2. 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 Partially calcified bladder stone considered less likely.   Electronically Signed   By: Dereck Ligas M.D.   On: 03/22/2013 20:34   Ir Perc Nephrostomy Left  03/24/2013   CLINICAL DATA:  73 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 She is currently admitted with acute on chronic renal failure which is thought to be exacerbated by the underlying obstructed uropathy. Bilateral percutaneous nephrostomy tubes are warranted for decompression in an effort to salvage renal function. She was anticoagulated on Coumadin at her initial presentation. She has so far received 3 units of FFP and her INR is currently 1.6. A 4th unit of FFP is currently running and will be continued throughout the procedure.  EXAM: IR ULTRASOUND GUIDANCE TISSUE ABLATION; PERC NEPHROSTOMY*L*; PERC NEPHROSTOMY*R*  Date: 03/24/2013  TECHNIQUE: Informed consent was obtained from the patient following explanation of the procedure, risks, benefits and alternatives. The patient understands, agrees and consents for the procedure. All questions were addressed. A time out was performed.  Maximal barrier sterile technique utilized including caps, mask, sterile gowns, sterile gloves, large sterile drape, hand hygiene, and Betadine skin prep.  The left flank was interrogated with ultrasound. The hydronephrotic left kidney was successfully identified. An appropriate skin entry site overlying a posterior calyx was identified and marked. Local anesthesia was attained by infiltration with 1% lidocaine. A small  dermatotomy was made. Under real-time sonographic guidance, a 21 gauge Accustick needle was used to puncture a posterior interpolar calyx. Clear urine returned through the needle. An image was obtained and stored for the medical record. The 0.018 inch wire was advanced into the renal collecting system. The Accustick sheath was then advanced over the wire and positioned within the renal pelvis. Position was confirmed with a gentle hand  injection of contrast material. A Rosen wire was advanced into the renal pelvis. The tract was then subsequently dilated to 10 [place] and a [place] all-purpose drain advanced over the wire and positioned with the locking loop in the renal pelvis. A final image was obtained and stored. The tube was secured to the skin with 0 Prolene suture and a plastic bumper. The tube was placed to gravity drainage. The patient tolerated the procedure well.  Attention was next turned to the right flank. The right flank was interrogated with ultrasound. The hydronephrotic right kidney was successfully identified. An appropriate skin entry site overlying a posterior caval ex was identified and marked. Local anesthesia was attained by infiltration with 1% lidocaine. A small dermatotomy was made. Under real-time sonographic guidance, a 21 gauge Accustick needle was used to puncture a posterior lower pole calyx. Clear urine returned through the needle. The 0.018 inch wire was advanced into the renal pelvis. The Accustick needle was exchanged for the Accustick sheath. Position within the renal collecting system was confirmed by gentle hand injection of contrast material. A J-wire was advanced through the Accustick sheath and coiled within the pelvis. The Accustick sheath was removed. The skin tract was dilated to 10 [place] and a [place] all-purpose drainage catheter was advanced over the wire and positioned with the locking loop in the renal pelvis. The tube was secured to the skin with 0  Prolene suture and connected to gravity bag drainage. The patient tolerated the procedure well without evidence of complication.  ANESTHESIA/SEDATION: Moderate (conscious) sedation was used. 0.5 mg Versed, 25 mcg Fentanyl were administered intravenously. The patient's vital signs were monitored continuously by radiology nursing throughout the procedure. 25 mg of Benadryl was also administered intravenously an addition to 2 g Ancef within 1 hr of skin incision.  Sedation Time: 13 minutes  CONTRAST:  10 cc Omnipaque 300  FLUOROSCOPY TIME:  54 seconds  PROCEDURE: 1. Ultrasound-guided puncture of the left renal collecting system 2. Placement of a 10 French percutaneous nephrostomy tube under fluoroscopic guidance 3. Ultrasound guided puncture of the right renal collecting system 4. Placement of a 10 French percutaneous nephrostomy tube under fluoroscopic guidance Interventional Radiologist:  Criselda Peaches, MD  IMPRESSION: Successful placement of bilateral 10 French percutaneous nephrostomy tubes.  Return to interventional radiology in 4 weeks for routine bilateral tube check and change. At that time, an attempt could be made to internalize to double-J ureteral stents, or nephro ureteral stents.  Signed,  Criselda Peaches, MD  [facility]  [facility]   Electronically Signed   By: Jacqulynn Cadet M.D.   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 She is currently admitted with acute on chronic renal failure which is thought to be exacerbated by the underlying obstructed uropathy. Bilateral percutaneous nephrostomy tubes are warranted for decompression in an effort to salvage renal function. She was anticoagulated on Coumadin at her initial presentation. She has so far received 3 units of FFP and her INR is currently 1.6. A 4th unit of FFP is currently running and will be  continued throughout the procedure.  EXAM: IR ULTRASOUND GUIDANCE TISSUE ABLATION; PERC NEPHROSTOMY*L*; PERC NEPHROSTOMY*R*  Date: 03/24/2013  TECHNIQUE: Informed consent was obtained from the patient following explanation of the procedure, risks, benefits and alternatives. The patient understands, agrees and consents for the procedure. All questions were addressed. A  time out was performed.  Maximal barrier sterile technique utilized including caps, mask, sterile gowns, sterile gloves, large sterile drape, hand hygiene, and Betadine skin prep.  The left flank was interrogated with ultrasound. The hydronephrotic left kidney was successfully identified. An appropriate skin entry site overlying a posterior calyx was identified and marked. Local anesthesia was attained by infiltration with 1% lidocaine. A small dermatotomy was made. Under real-time sonographic guidance, a 21 gauge Accustick needle was used to puncture a posterior interpolar calyx. Clear urine returned through the needle. An image was obtained and stored for the medical record. The 0.018 inch wire was advanced into the renal collecting system. The Accustick sheath was then advanced over the wire and positioned within the renal pelvis. Position was confirmed with a gentle hand injection of contrast material. A Rosen wire was advanced into the renal pelvis. The tract was then subsequently dilated to 10 [place] and a [place] all-purpose drain advanced over the wire and positioned with the locking loop in the renal pelvis. A final image was obtained and stored. The tube was secured to the skin with 0 Prolene suture and a plastic bumper. The tube was placed to gravity drainage. The patient tolerated the procedure well.  Attention was next turned to the right flank. The right flank was interrogated with ultrasound. The hydronephrotic right kidney was successfully identified. An appropriate skin entry site overlying a posterior caval ex was identified and  marked. Local anesthesia was attained by infiltration with 1% lidocaine. A small dermatotomy was made. Under real-time sonographic guidance, a 21 gauge Accustick needle was used to puncture a posterior lower pole calyx. Clear urine returned through the needle. The 0.018 inch wire was advanced into the renal pelvis. The Accustick needle was exchanged for the Accustick sheath. Position within the renal collecting system was confirmed by gentle hand injection of contrast material. A J-wire was advanced through the Accustick sheath and coiled within the pelvis. The Accustick sheath was removed. The skin tract was dilated to 10 [place] and a [place] all-purpose drainage catheter was advanced over the wire and positioned with the locking loop in the renal pelvis. The tube was secured to the skin with 0 Prolene suture and connected to gravity bag drainage. The patient tolerated the procedure well without evidence of complication.  ANESTHESIA/SEDATION: Moderate (conscious) sedation was used. 0.5 mg Versed, 25 mcg Fentanyl were administered intravenously. The patient's vital signs were monitored continuously by radiology nursing throughout the procedure. 25 mg of Benadryl was also administered intravenously an addition to 2 g Ancef within 1 hr of skin incision.  Sedation Time: 13 minutes  CONTRAST:  10 cc Omnipaque 300  FLUOROSCOPY TIME:  54 seconds  PROCEDURE: 1. Ultrasound-guided puncture of the left renal collecting system 2. Placement of a 10 French percutaneous nephrostomy tube under fluoroscopic guidance 3. Ultrasound guided puncture of the right renal collecting system 4. Placement of a 10 French percutaneous nephrostomy tube under fluoroscopic guidance Interventional Radiologist:  Criselda Peaches, MD  IMPRESSION: Successful placement of bilateral 10 French percutaneous nephrostomy tubes.  Return to interventional radiology in 4 weeks for routine bilateral tube check and change. At that time, an attempt  could be made to internalize to double-J ureteral stents, or nephro ureteral stents.  Signed,  Criselda Peaches, MD  [facility]  [facility]   Electronically Signed   By: Jacqulynn Cadet M.D.   On: 03/24/2013 17:26   Ir US Guide Bx  Asp/drain  03/24/2013   CLINICAL DATA:  73 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 She is currently admitted with acute on chronic renal failure which is thought to be exacerbated by the underlying obstructed uropathy. Bilateral percutaneous nephrostomy tubes are warranted for decompression in an effort to salvage renal function. She was anticoagulated on Coumadin at her initial presentation. She has so far received 3 units of FFP and her INR is currently 1.6. A 4th unit of FFP is currently running and will be continued throughout the procedure.  EXAM: IR ULTRASOUND GUIDANCE TISSUE ABLATION; PERC NEPHROSTOMY*L*; PERC NEPHROSTOMY*R*  Date: 03/24/2013  TECHNIQUE: Informed consent was obtained from the patient following explanation of the procedure, risks, benefits and alternatives. The patient understands, agrees and consents for the procedure. All questions were addressed. A time out was performed.  Maximal barrier sterile technique utilized including caps, mask, sterile gowns, sterile gloves, large sterile drape, hand hygiene, and Betadine skin prep.  The left flank was interrogated with ultrasound. The hydronephrotic left kidney was successfully identified. An appropriate skin entry site overlying a posterior calyx was identified and marked. Local anesthesia was attained by infiltration with 1% lidocaine. A small dermatotomy was made. Under real-time sonographic guidance, a 21 gauge Accustick needle was used to puncture a posterior interpolar calyx. Clear urine returned through the needle. An image was obtained and stored for the medical record. The 0.018 inch wire was advanced into the renal collecting system. The Accustick  sheath was then advanced over the wire and positioned within the renal pelvis. Position was confirmed with a gentle hand injection of contrast material. A Rosen wire was advanced into the renal pelvis. The tract was then subsequently dilated to 10 [place] and a [place] all-purpose drain advanced over the wire and positioned with the locking loop in the renal pelvis. A final image was obtained and stored. The tube was secured to the skin with 0 Prolene suture and a plastic bumper. The tube was placed to gravity drainage. The patient tolerated the procedure well.  Attention was next turned to the right flank. The right flank was interrogated with ultrasound. The hydronephrotic right kidney was successfully identified. An appropriate skin entry site overlying a posterior caval ex was identified and marked. Local anesthesia was attained by infiltration with 1% lidocaine. A small dermatotomy was made. Under real-time sonographic guidance, a 21 gauge Accustick needle was used to puncture a posterior lower pole calyx. Clear urine returned through the needle. The 0.018 inch wire was advanced into the renal pelvis. The Accustick needle was exchanged for the Accustick sheath. Position within the renal collecting system was confirmed by gentle hand injection of contrast material. A J-wire was advanced through the Accustick sheath and coiled within the pelvis. The Accustick sheath was removed. The skin tract was dilated to 10 [place] and a [place] all-purpose drainage catheter was advanced over the wire and positioned with the locking loop in the renal pelvis. The tube was secured to the skin with 0 Prolene suture and connected to gravity bag drainage. The patient tolerated the procedure well without evidence of complication.  ANESTHESIA/SEDATION: Moderate (conscious) sedation was used. 0.5 mg Versed, 25 mcg Fentanyl were administered intravenously. The patient's vital signs were monitored continuously by radiology  nursing throughout the procedure. 25 mg of Benadryl was also administered intravenously an addition to 2 g Ancef within 1 hr of skin incision.  Sedation Time: 13 minutes  CONTRAST:  10 cc Omnipaque 300  FLUOROSCOPY TIME:  54  seconds  PROCEDURE: 1. Ultrasound-guided puncture of the left renal collecting system 2. Placement of a 10 French percutaneous nephrostomy tube under fluoroscopic guidance 3. Ultrasound guided puncture of the right renal collecting system 4. Placement of a 10 French percutaneous nephrostomy tube under fluoroscopic guidance Interventional Radiologist:  Criselda Peaches, MD  IMPRESSION: Successful placement of bilateral 10 French percutaneous nephrostomy tubes.  Return to interventional radiology in 4 weeks for routine bilateral tube check and change. At that time, an attempt could be made to internalize to double-J ureteral stents, or nephro ureteral stents.  Signed,  Criselda Peaches, MD  [facility]  [facility]   Electronically Signed   By: Jacqulynn Cadet M.D.   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 She is currently admitted with acute on chronic renal failure which is thought to be exacerbated by the underlying obstructed uropathy. Bilateral percutaneous nephrostomy tubes are warranted for decompression in an effort to salvage renal function. She was anticoagulated on Coumadin at her initial presentation. She has so far received 3 units of FFP and her INR is currently 1.6. A 4th unit of FFP is currently running and will be continued throughout the procedure.  EXAM: IR ULTRASOUND GUIDANCE TISSUE ABLATION; PERC NEPHROSTOMY*L*; PERC NEPHROSTOMY*R*  Date: 03/24/2013  TECHNIQUE: Informed consent was obtained from the patient following explanation of the procedure, risks, benefits and alternatives. The patient understands, agrees and consents for the  procedure. All questions were addressed. A time out was performed.  Maximal barrier sterile technique utilized including caps, mask, sterile gowns, sterile gloves, large sterile drape, hand hygiene, and Betadine skin prep.  The left flank was interrogated with ultrasound. The hydronephrotic left kidney was successfully identified. An appropriate skin entry site overlying a posterior calyx was identified and marked. Local anesthesia was attained by infiltration with 1% lidocaine. A small dermatotomy was made. Under real-time sonographic guidance, a 21 gauge Accustick needle was used to puncture a posterior interpolar calyx. Clear urine returned through the needle. An image was obtained and stored for the medical record. The 0.018 inch wire was advanced into the renal collecting system. The Accustick sheath was then advanced over the wire and positioned within the renal pelvis. Position was confirmed with a gentle hand injection of contrast material. A Rosen wire was advanced into the renal pelvis. The tract was then subsequently dilated to 10 [place] and a [place] all-purpose drain advanced over the wire and positioned with the locking loop in the renal pelvis. A final image was obtained and stored. The tube was secured to the skin with 0 Prolene suture and a plastic bumper. The tube was placed to gravity drainage. The patient tolerated the procedure well.  Attention was next turned to the right flank. The right flank was interrogated with ultrasound. The hydronephrotic right kidney was successfully identified. An appropriate skin entry site overlying a posterior caval ex was identified and marked. Local anesthesia was attained by infiltration with 1% lidocaine. A small dermatotomy was made. Under real-time sonographic guidance, a 21 gauge Accustick needle was used to puncture a posterior lower pole calyx. Clear urine returned through the needle. The 0.018 inch wire was advanced into the renal pelvis. The  Accustick needle was exchanged for the Accustick sheath. Position within the renal collecting system was confirmed by gentle hand injection of contrast material. A J-wire was advanced through the Accustick  sheath and coiled within the pelvis. The Accustick sheath was removed. The skin tract was dilated to 10 [place] and a [place] all-purpose drainage catheter was advanced over the wire and positioned with the locking loop in the renal pelvis. The tube was secured to the skin with 0 Prolene suture and connected to gravity bag drainage. The patient tolerated the procedure well without evidence of complication.  ANESTHESIA/SEDATION: Moderate (conscious) sedation was used. 0.5 mg Versed, 25 mcg Fentanyl were administered intravenously. The patient's vital signs were monitored continuously by radiology nursing throughout the procedure. 25 mg of Benadryl was also administered intravenously an addition to 2 g Ancef within 1 hr of skin incision.  Sedation Time: 13 minutes  CONTRAST:  10 cc Omnipaque 300  FLUOROSCOPY TIME:  54 seconds  PROCEDURE: 1. Ultrasound-guided puncture of the left renal collecting system 2. Placement of a 10 French percutaneous nephrostomy tube under fluoroscopic guidance 3. Ultrasound guided puncture of the right renal collecting system 4. Placement of a 10 French percutaneous nephrostomy tube under fluoroscopic guidance Interventional Radiologist:  Sterling Big, MD  IMPRESSION: Successful placement of bilateral 10 French percutaneous nephrostomy tubes.  Return to interventional radiology in 4 weeks for routine bilateral tube check and change. At that time, an attempt could be made to internalize to double-J ureteral stents, or nephro ureteral stents.  Signed,  Sterling Big, MD  [facility]  [facility]   Electronically Signed   By: Malachy Moan M.D.   On: 03/24/2013 17:26   Dg Chest Port 1 View  03/31/2013   CLINICAL DATA:   Shortness of breath  EXAM: PORTABLE CHEST - 1 VIEW  COMPARISON:  PA and lateral chest x-rays of March 22, 2013 and September 16, 2009  FINDINGS: The lungs are adequately inflated. The interstitial markings are chronically increased. The cardiac silhouette is normal in size. The pulmonary vascularity is not engorged. The mediastinum is normal in width. There is no pleural effusion. The observed portions of the bony thorax appear normal.  IMPRESSION: There is no evidence of pneumonia nor CHF nor other acute cardiopulmonary abnormality. There is stable mild prominence of the pulmonary interstitial markings.   Electronically Signed   By: David  [place]   On: 03/31/2013 09:08    DISCHARGE EXAMINATION: See earlier progress note.  DISPOSITION: SNF  Discharge Orders   Future Appointments Provider Department Dept Phone   04/05/2013 3:30 PM Malissa Hippo, MD [facility] [phone]   07/25/2013 11:00 AM [facility] [phone]   07/26/2013 1:00 PM [facility] Covering Provider [facility] [phone]   Future Orders Complete By Expires   Diet Carb Modified  As directed    Discharge instructions  As directed    Comments:     Please check BMET in 3 days to check renal function, bicarb and potassium levels. PCP to arrange follow up with Nephrology for CKD. Drain PCN tubes to gravity. Flush the tubes three times daily. Please see other instructions as well. Please call Dr. Lynnae Sandhoff (Urologist) for further information.   Increase activity slowly  As directed       ALLERGIES:  Allergies  Allergen Reactions  . Motrin [Ibuprofen] Rash  . Sulfa Drugs Cross Reactors Rash  . Iohexol      Code: HIVES, Desc: pt states she has dye allergy, Onset Date: 99774142   . Quinine Derivatives Rash    Current Discharge Medication List  START taking these medications   Details  cephALEXin (KEFLEX) 500 MG capsule Take 1 capsule (500 mg total) by mouth every 12  (twelve) hours. For 5 more days    docusate sodium 100 MG CAPS Take 100 mg by mouth 2 (two) times daily. Qty: 10 capsule, Refills: 0    feeding supplement, GLUCERNA SHAKE, (GLUCERNA SHAKE) LIQD Take 237 mLs by mouth 2 (two) times daily between meals. Refills: 0    HYDROcodone-acetaminophen (NORCO/VICODIN) 5-325 MG per tablet Take 1-2 tablets by mouth every 4 (four) hours as needed for moderate pain. Qty: 30 tablet, Refills: 0    ipratropium-albuterol (DUONEB) 0.5-2.5 (3) MG/3ML SOLN Take 3 mLs by nebulization every 4 (four) hours as needed. Qty: 360 mL    polyethylene glycol (MIRALAX / GLYCOLAX) packet Take 17 g by mouth 2 (two) times daily. Qty: 14 each, Refills: 0    sodium bicarbonate 650 MG tablet Take 1 tablet (650 mg total) by mouth 2 (two) times daily.      CONTINUE these medications which have CHANGED   Details  acetaminophen (TYLENOL) 325 MG tablet Take 2 tablets (650 mg total) by mouth every 6 (six) hours as needed for mild pain or fever (or Fever >/= 101).    cloNIDine (CATAPRES) 0.1 MG tablet Take 1 tablet (0.1 mg total) by mouth daily. Qty: 60 tablet, Refills: 11    furosemide (LASIX) 40 MG tablet Take 1 tablet (40 mg total) by mouth daily. Qty: 30 tablet    potassium chloride (KLOR-CON) 20 MEQ packet Take 20 mEq by mouth daily.    warfarin (COUMADIN) 1 MG tablet Take 2 tablets (2 mg total) by mouth daily.      CONTINUE these medications which have NOT CHANGED   Details  Calcium Carbonate-Vit D-Min 600-200 MG-UNIT TABS Take 1 tablet by mouth daily.   Associated Diagnoses: Osteoporosis    Cholecalciferol (D3 ADULT PO) Take 1,000 Units by mouth daily.     ezetimibe (ZETIA) 10 MG tablet Take 10 mg by mouth daily.    ferrous sulfate 325 (65 FE) MG tablet Take 325 mg by mouth daily with breakfast.    insulin glargine (LANTUS) 100 UNIT/ML injection Inject 30 Units into the skin at bedtime.    levothyroxine (LEVOTHROID) 112 MCG tablet Take 1 tablet (112 mcg  total) by mouth daily before breakfast. Qty: 30 tablet, Refills: 3    metoprolol succinate (TOPROL-XL) 50 MG 24 hr tablet Take 50 mg by mouth 2 (two) times daily. Take with or immediately following a meal.    Multiple Vitamins-Minerals (WOMENS MULTIVITAMIN PLUS PO) Take 1 capsule by mouth.   Associated Diagnoses: Colon cancer    ondansetron (ZOFRAN ODT) 4 MG disintegrating tablet Take 1 tablet (4 mg total) by mouth every 8 (eight) hours as needed for nausea or vomiting. Qty: 20 tablet, Refills: 0      STOP taking these medications     insulin lispro (HUMALOG) 100 UNIT/ML injection      ciprofloxacin (CIPRO) 500 MG tablet        Follow-up Information   Follow up with DAHLSTEDT, Lillette Boxer, MD. (We will call you to set up appointment)    Specialty:  Urology   Contact information:   [facility]  [place] [address] [phone]       Follow up with Rogene Houston, MD. Schedule an appointment as soon as possible for a visit in 3 days. (post hospitalization follow up)    Specialty:  Gastroenterology   Contact information:   [place], [address] [address] [phone]       TOTAL DISCHARGE TIME: 35 mins  [facility] Pager [phone]  04/03/2013, 8:20 AM

## 2013-04-03 NOTE — Progress Notes (Signed)
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 INR was reversed and pt underwent B nephrostomy tube placement by IR. Pt then underwent TURBT by Urology on 03/27/13. OK to resume full dose anticoagulation per Urology. Pt's coumadin has been resumed 3/10 with heparin bridge. Renal fx had been improving since nephrostomy tube placement.  Assessment/Plan:  Transient Right Sided Chest Pain this morning Pain resolved in 10 minutes. It was sharp. EKG reviewed and similar to previous with probably more pronounced T wave changes. Troponin is normal. This was most likely pleuritic and probably related to the nephrostomy tube. Repeat one more trop level and if negative, will not require further work up.  Acute-on-chronic kidney injury  This was secondary to obstructive uropathy from bladder tumor. Renal function has improved from admission. Creatinine remains around 2-2.5. She likely has underlying CKD due to DM.  Has low UO. Bicarb low but stable. Started on Sod Bicarb. Given IV Lasix 3/12 with good output mainly from nephrostomy. Continue oral Lasix. PCP to arrange Nephrology referral as OP.  Dyspnea Mainly with exertion. Does have a cough which is chronic as she is a smoker. CXR was negative for pneumonia or CHF. Dyspnea and weakness most likely anemia. She was transfused PRBC's with only partial improvement. I think deconditioning is playing a major role.  Bladder Cancer She is s/p TURBT 3/9 and s/p nephrostomy tube placement on 3/6 with initial good improvement in renal fx. Urology following. Surgical pathology shows invasive urothelial carcinoma. Further management as OP per Urology. PCN draining well. Will likely go to SNF with nephrostomy tubes.  Diabetes mellitus Type 2 Stable. Cont current  regimen  Hypertension and chronic diastolic dysfunction  Continue clonidine and BB. BP low normal. Since she is on daily lasix as well, will cut back on clonidine.  Active smoking NoT wheezing currently. Cont duonebs PRN  Hypothyroidism  Continue Synthroid as tolerated  Acute Blood Loss Anemia Resolved. Was likely secondary to blood loss via nephrostomy tubes while on anticoagulation. Improved with recent PRBC transfusion. No further overt bleeding. Transfused 2 more units 3/13 due to weakness.   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 Had been maintained on chronic anticoagulation. Warfarin resumed 3/10. INR therapeutic. Heparin stopped. INR was reversed for procedures earlier this admission.   E Coli UTI Sensitive to Cefazolin. Now on Keflex orally.   Constipation Miralax and stool softeners.  Code Status: DNR DVT Prophylaxis: On warfarin/Heparin Family Communication: Discussed with patient, son and husband. Disposition Plan: If trop at 10Am is normal, she can go to SNF.   Consultants:  Urology  IR  Procedures:  B nephrostomy tube placement per IR 03/24/13  TURBT 03/27/13  Antibiotics:  Rocephin 03/27/13>>>3/11  Kefelx 3/11-->  HPI/Subjective: Patient mentioned events of early morning. Denies any pain or shortness of breath currently.   Objective: Filed Vitals:   04/02/13 1406 04/02/13 2152 04/03/13 0300 04/03/13 0617  BP: 129/73 135/60 153/67 153/85  Pulse: 94 95 100 112  Temp: 98.4 F (36.9 C) 98.4 F (36.9 C)  97.8 F (36.6 C)  TempSrc: Oral Oral  Oral  Resp: 20 18  18   Height:      Weight:    74.9 kg (165 lb 2 oz)  SpO2: 94% 95% 94% 96%    Intake/Output Summary (Last 24 hours) at 04/03/13 0806 Last data filed at 04/03/13 0653  Gross per 24 hour  Intake    620 ml  Output   1500 ml  Net   -880 ml   Filed Weights   04/01/13 0615 04/02/13 0553 04/03/13 0617  Weight: 75 kg (165 lb 5.5 oz) 75.2  kg (165 lb 12.6 oz) 74.9 kg (165 lb 2 oz)    Exam:  General:  Awake, in nad  Cardiovascular: regular, s1, s2. Edema noted bil LE.  Respiratory: normal resp effort, no wheezing. No crackles.  Abdomen: soft, nondistended. Mild vague tenderness in lower abdomen without rebound rigidity or guarding. BS present.  Musculoskeletal: perfused, no clubbing   Data Reviewed: Basic Metabolic Panel:  Recent Labs Lab 03/30/13 0350 03/31/13 0345 04/01/13 0907 04/02/13 0423 04/03/13 0630  NA 139 137 138 138 138  K 3.9 4.1 5.5* 3.9 3.6*  CL 106 102 105 101 98  CO2 17* 19 13* 18* 23  GLUCOSE 102* 106* 136* 106* 121*  BUN 28* 31* 36* 38* 36*  CREATININE 2.27* 2.39* 2.26* 2.32* 1.85*  CALCIUM 8.7 9.0 9.3 8.9 9.1   CBC:  Recent Labs Lab 03/28/13 0455 03/29/13 0345 03/30/13 0350 03/31/13 0345 04/01/13 0907  WBC 10.7* 8.3 7.7 8.4 11.7*  HGB 9.2* 7.9* 7.3* 7.6* 12.0  HCT 29.7* 26.3* 23.7* 24.9* 36.3  MCV 86.3 86.5 85.9 86.5 84.8  PLT 372 308 281 277 232    CBG:  Recent Labs Lab 04/02/13 0726 04/02/13 1217 04/02/13 1712 04/02/13 2147 04/03/13 0755  GLUCAP 112* 146* 177* 160* 119*    Recent Results (from the past 240 hour(s))  MRSA PCR SCREENING     Status: None   Collection Time    03/24/13  2:05 PM      Result Value Ref Range Status   MRSA by PCR NEGATIVE  NEGATIVE Final   Comment:            The GeneXpert MRSA Assay (FDA     approved for NASAL specimens     only), is one component of a     comprehensive MRSA colonization     surveillance program. It is not     intended to diagnose MRSA     infection nor to guide or     monitor treatment for     MRSA infections.  SURGICAL PCR SCREEN     Status: Abnormal   Collection Time    03/27/13  3:52 AM      Result Value Ref Range Status   MRSA, PCR POSITIVE (*) NEGATIVE Final   Comment: RESULT CALLED TO, READ BACK BY AND VERIFIED WITH:     A. RODRIGUIZ RN AT [id number] ON 03.09.15 BY SHUEA   Staphylococcus aureus POSITIVE  (*) NEGATIVE Final   Comment:            The Xpert SA Assay (FDA     approved for NASAL specimens     in patients over 75 years of age),     is one component of     a comprehensive surveillance     program.  Test performance has     been validated by [employer] for patients greater     than or equal to 82 year old.     It is not intended     to diagnose infection nor to     guide  or monitor treatment.     Studies: No results found.  Scheduled Meds: . cephALEXin  500 mg Oral Q12H  . Chlorhexidine Gluconate Cloth  6 each Topical Q0600  . cloNIDine  0.1 mg Oral Daily  . docusate sodium  100 mg Oral BID  . ezetimibe  10 mg Oral Daily  . feeding supplement (GLUCERNA SHAKE)  237 mL Oral BID BM  . ferrous sulfate  325 mg Oral Q breakfast  . furosemide  60 mg Oral Daily  . insulin aspart  0-9 Units Subcutaneous TID WC  . insulin glargine  30 Units Subcutaneous QHS  . levothyroxine  112 mcg Oral QAC breakfast  . metoprolol succinate  50 mg Oral Daily  . mupirocin ointment  1 application Nasal BID  . polyethylene glycol  17 g Oral BID  . sodium bicarbonate  650 mg Oral TID  . warfarin  2 mg Oral ONCE-1800  . Warfarin - Pharmacist Dosing Inpatient   Does not apply q1800   Continuous Infusions: . sodium chloride 20 mL/hr at 03/31/13 1338  . lactated ringers      Principal Problem:   Acute-on-chronic kidney injury Active Problems:   History of pulmonary embolus (PE)   Diabetes mellitus   Hypertension   Open wound of abdominal wall without complication   Smoking   Obstructive uropathy   Bladder tumor   Bilateral hydronephrosis   Bladder cancer   [facility]  [facility] Pager [phone]  If 7PM-7AM, please contact night-coverage at [email or web address], password [facility] 04/03/2013, 8:06 AM  LOS: 12 days

## 2013-04-03 NOTE — Progress Notes (Signed)
ANTICOAGULATION CONSULT NOTE - Follow Up Consult  Pharmacy Consult for warfarin Indication: hx of VTE  Allergies  Allergen Reactions  . Motrin [Ibuprofen] Rash  . Sulfa Drugs Cross Reactors Rash  . Iohexol      Code: HIVES, Desc: pt states she has dye allergy, Onset Date: 16109604   . Quinine Derivatives Rash    Patient Measurements: Height: 5\' 1"  (154.9 cm) Weight: 165 lb 2 oz (74.9 kg) IBW/kg (Calculated) : 47.8   Vital Signs: Temp: 97.8 F (36.6 C) (03/16 0617) Temp src: Oral (03/16 0617) BP: 153/85 mmHg (03/16 0617) Pulse Rate: 112 (03/16 0617)  Labs:  Recent Labs  04/01/13 0907 04/01/13 0956 04/02/13 0423 04/03/13 0450 04/03/13 0630  HGB 12.0  --   --   --   --   HCT 36.3  --   --   --   --   PLT 232  --   --   --   --   LABPROT  --  28.7* 30.8* 28.4*  --   INR  --  2.82* 3.10* 2.78*  --   CREATININE 2.26*  --  2.32*  --  1.85*  TROPONINI  --   --   --  <0.30  --     Estimated Creatinine Clearance: 24.7 ml/min (by C-G formula based on Cr of 1.85).   Medications:  Scheduled:  . cephALEXin  500 mg Oral Q12H  . Chlorhexidine Gluconate Cloth  6 each Topical Q0600  . cloNIDine  0.1 mg Oral Daily  . docusate sodium  100 mg Oral BID  . ezetimibe  10 mg Oral Daily  . feeding supplement (GLUCERNA SHAKE)  237 mL Oral BID BM  . ferrous sulfate  325 mg Oral Q breakfast  . furosemide  60 mg Oral Daily  . insulin aspart  0-9 Units Subcutaneous TID WC  . insulin glargine  30 Units Subcutaneous QHS  . levothyroxine  112 mcg Oral QAC breakfast  . metoprolol succinate  50 mg Oral Daily  . mupirocin ointment  1 application Nasal BID  . polyethylene glycol  17 g Oral BID  . sodium bicarbonate  650 mg Oral TID  . Warfarin - Pharmacist Dosing Inpatient   Does not apply q1800   Infusions:  . sodium chloride 20 mL/hr at 03/31/13 1338  . 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   Warfarin therapy was interrupted on admission, a total of 4 units FFP was administered for INR reversal, and IV heparin was used for bridging.   At midnight 3/8 heparin was discontinued and on 3/9 patient underwent TURBT. Orders were received today from Dr. Wyline Copas to resume warfarin along with IV heparin bridge until INR therapeutic, with pharmacy dosing assistance for anticoagulants.  He has already discussed the case with urologist and obtained clearance to proceed with this. Heparin dosing records from earlier this admission were reviewed.  Patient's dosage requirements were variable and larger than predicted for age and weight.     IV heparin stopped 3/12 with therapeutic INR  INR therapeutic today after slightly supratherapeutic  Home warfarin dose = 3 mg daily  3/14 CBC: Plts 232K, Hgb 12.0 after 2 units of Hgb 3/13 (transfused d/t to drop in Hgb with patient weakness).  Pt experienced blood loss via nephrostomy tubes on admission.  No further overt bleeding reported.  Goal of Therapy:  INR 2-3   Plan:  1) Warfarin 2mg  today 2) Daily INR.  Peggyann Juba, PharmD, BCPS Pager: [phone]  04/03/2013 8:00 AM

## 2013-04-05 ENCOUNTER — Telehealth (INDEPENDENT_AMBULATORY_CARE_PROVIDER_SITE_OTHER): Payer: Self-pay | Admitting: *Deleted

## 2013-04-05 ENCOUNTER — Encounter (INDEPENDENT_AMBULATORY_CARE_PROVIDER_SITE_OTHER): Payer: Self-pay | Admitting: Internal Medicine

## 2013-04-05 ENCOUNTER — Encounter (INDEPENDENT_AMBULATORY_CARE_PROVIDER_SITE_OTHER): Payer: BC Managed Care – PPO | Admitting: Internal Medicine

## 2013-04-05 NOTE — Telephone Encounter (Signed)
Patient has an appointment with Dr.Rehman on 04/05/13. Patient presented to the office with her husband. A few minutes prior to her arrival ,2:55 pm, Dr.Rehman called the office to say that he was running an hour late and to contact patient and make her aware. Patient was advised. Mr. Witherell questioned this but agreed to wait. At 3;20 pm I called the patient back to get her ready for Dr.Rehman. Mr. Saintil told me that they would wait until 4 pm and then they had to leave and go back to the Nursing Home. It was the husband's decision to leave and not wait I advised that Dr.Rehman was in a procedure and that he was running late, but that we would check on this . Dr.Rehman was called , he was in procedure, he advised [place] that he would not be in office by 4 PM ,and to put the patient on for tomorrow afternoon. Mr. Gerken was very abrupt to [place], stating that Dr. Laural Golden needed to get his act together, when a patient had an appointment  , he needed to be here. Our office was very calm, and responded appropriately.  We had a cancellation for Monday ,March 23 @ 10:30 am with Dr.Rehman. The patient and spouse were in an agreement for this appointment. Patient was embarrassed by her spouse. Avante /Hope was called and made aware of this .

## 2013-04-10 ENCOUNTER — Encounter (INDEPENDENT_AMBULATORY_CARE_PROVIDER_SITE_OTHER): Payer: Self-pay | Admitting: Internal Medicine

## 2013-04-10 ENCOUNTER — Ambulatory Visit (INDEPENDENT_AMBULATORY_CARE_PROVIDER_SITE_OTHER): Payer: BC Managed Care – PPO | Admitting: Internal Medicine

## 2013-04-10 VITALS — BP 128/68 | HR 82 | Temp 97.3°F | Resp 16 | Ht 61.0 in | Wt 151.3 lb

## 2013-04-10 DIAGNOSIS — N189 Chronic kidney disease, unspecified: Secondary | ICD-10-CM

## 2013-04-10 DIAGNOSIS — R21 Rash and other nonspecific skin eruption: Secondary | ICD-10-CM

## 2013-04-10 DIAGNOSIS — D649 Anemia, unspecified: Secondary | ICD-10-CM

## 2013-04-10 DIAGNOSIS — Z9229 Personal history of other drug therapy: Secondary | ICD-10-CM

## 2013-04-10 DIAGNOSIS — Z7901 Long term (current) use of anticoagulants: Secondary | ICD-10-CM

## 2013-04-10 DIAGNOSIS — E119 Type 2 diabetes mellitus without complications: Secondary | ICD-10-CM

## 2013-04-10 LAB — CBC
HCT: 37.4 % (ref 36.0–46.0)
Hemoglobin: 12.4 g/dL (ref 12.0–15.0)
MCH: 27.4 pg (ref 26.0–34.0)
MCHC: 33.2 g/dL (ref 30.0–36.0)
MCV: 82.6 fL (ref 78.0–100.0)
Platelets: 656 10*3/uL — ABNORMAL HIGH (ref 150–400)
RBC: 4.53 MIL/uL (ref 3.87–5.11)
RDW: 15.9 % — AB (ref 11.5–15.5)
WBC: 12.4 10*3/uL — AB (ref 4.0–10.5)

## 2013-04-10 LAB — BASIC METABOLIC PANEL
BUN: 36 mg/dL — ABNORMAL HIGH (ref 6–23)
CO2: 26 meq/L (ref 19–32)
Calcium: 8.9 mg/dL (ref 8.4–10.5)
Chloride: 94 mEq/L — ABNORMAL LOW (ref 96–112)
Creat: 1.7 mg/dL — ABNORMAL HIGH (ref 0.50–1.10)
Glucose, Bld: 231 mg/dL — ABNORMAL HIGH (ref 70–99)
Potassium: 4.9 mEq/L (ref 3.5–5.3)
SODIUM: 135 meq/L (ref 135–145)

## 2013-04-10 MED ORDER — NYSTATIN-TRIAMCINOLONE 100000-0.1 UNIT/GM-% EX CREA: 1.0000 "application " | TOPICAL_CREAM | Freq: Two times a day (BID) | CUTANEOUS | Status: AC

## 2013-04-10 NOTE — Patient Instructions (Signed)
Physician we'll contact with results of blood work.

## 2013-04-10 NOTE — Progress Notes (Signed)
This encounter was created in error - please disregard.

## 2013-04-10 NOTE — Progress Notes (Signed)
Presenting complaint;  Followup to recent hospitalization. Patient has multiple medical problems.  Subjective:  Patient is 73 year old Caucasian female who was recently hospitalized at [facility] for bilateral hydronephrosis and renal failure. She was also found to have bladder tumor on abdominal pelvic CT. She underwent cystoscopic removal of tumor which was invasive high grade urothelial carcinoma. Ureteric stents could not be placed and she therefore had bilateral nephrostomy. Post procedure she had some bleeding though she was anticoagulated. She required transfusion. She was discharged last week and followup advised. She is accompanied by her husband Marcello Moores. She is presently recuperating at [facility]. She finished antibiotic 2 days ago. She does not feel well. She has poor appetite and intermittent nausea. She denies vomiting or abdominal pain. She states drainage for abdominal wound stopped while she was hospitalized. She has history of colocutaneous fistula. She is not passing blood in her urine any more. She complains of constipation. She denies fever or chills.            Current Medications: Outpatient Encounter Prescriptions as of 04/10/2013  Medication Sig  . acetaminophen (TYLENOL) 325 MG tablet Take 2 tablets (650 mg total) by mouth every 6 (six) hours as needed for mild pain or fever (or Fever >/= 101).  . Calcium Carbonate-Vit D-Min 600-200 MG-UNIT TABS Take 1 tablet by mouth daily.  . cephALEXin (KEFLEX) 500 MG capsule Take 1 capsule (500 mg total) by mouth every 12 (twelve) hours. For 5 more days  . Cholecalciferol (D3 ADULT PO) Take 1,000 Units by mouth daily.   . cloNIDine (CATAPRES) 0.1 MG tablet Take 1 tablet (0.1 mg total) by mouth daily.  Marland Kitchen docusate sodium 100 MG CAPS Take 100 mg by mouth 2 (two) times daily.  Marland Kitchen ezetimibe (ZETIA) 10 MG tablet Take 10 mg by mouth daily.  . feeding supplement, GLUCERNA SHAKE, (GLUCERNA SHAKE) LIQD Take 237 mLs by mouth 2 (two) times daily  between meals.  . ferrous sulfate 325 (65 FE) MG tablet Take 325 mg by mouth daily with breakfast.  . furosemide (LASIX) 40 MG tablet Take 1 tablet (40 mg total) by mouth daily.  Marland Kitchen HUMALOG 100 UNIT/ML injection Inject 3 Units into the skin. Sliding Scale per patient  . HYDROcodone-acetaminophen (NORCO/VICODIN) 5-325 MG per tablet Take 1-2 tablets by mouth every 4 (four) hours as needed for moderate pain.  Marland Kitchen insulin glargine (LANTUS) 100 UNIT/ML injection Inject 30 Units into the skin at bedtime.  Marland Kitchen ipratropium-albuterol (DUONEB) 0.5-2.5 (3) MG/3ML SOLN Take 3 mLs by nebulization every 4 (four) hours as needed.  Marland Kitchen levothyroxine (LEVOTHROID) 112 MCG tablet Take 1 tablet (112 mcg total) by mouth daily before breakfast.  . metoprolol succinate (TOPROL-XL) 50 MG 24 hr tablet Take 50 mg by mouth 2 (two) times daily. Take with or immediately following a meal.  . Multiple Vitamins-Minerals (WOMENS MULTIVITAMIN PLUS PO) Take 1 capsule by mouth.  . NON FORMULARY Glucemia Shake Liquid - Give 237 cc by mouth two times a day for a supplement  . ondansetron (ZOFRAN ODT) 4 MG disintegrating tablet Take 1 tablet (4 mg total) by mouth every 8 (eight) hours as needed for nausea or vomiting.  . polyethylene glycol (MIRALAX / GLYCOLAX) packet Take 17 g by mouth 2 (two) times daily.  . potassium chloride (KLOR-CON) 20 MEQ packet Take 20 mEq by mouth daily.  . sodium bicarbonate 650 MG tablet Take 1 tablet (650 mg total) by mouth 2 (two) times daily.  Marland Kitchen warfarin (COUMADIN) 1 MG tablet Take 2  tablets (2 mg total) by mouth daily.     Objective: Blood pressure 128/68, pulse 82, temperature 97.3 F (36.3 C), temperature source Oral, resp. rate 16, height 5\' 1"  (1.549 m), weight 151 lb 4.8 oz (68.629 kg). Patient is alert and in no acute distress. Conjunctiva is pink. Sclera is nonicteric Oropharyngeal mucosa is normal. No neck masses or thyromegaly noted. She has bilateral nephrostomy tubes in place. No drainage  noted from increased site but she has skin rash surrounding tube entry on either side. Cardiac exam with regular rhythm normal S1 and S2. No murmur or gallop noted. Lungs are clear to auscultation. Abdomen. She has midline scar. No open area noted. Abdomen is soft and nontender without organomegaly or masses.  She has trace edema around ankles.   Assessment:  #1. Remote history of coronary embolism and patient is chronically anticoagulated. INR needs to be monitored since she was an antibiotic. #2. Chronic kidney disease. Serum creatinine on 03/03/2013 was 2.85. It remains to be seen how much of her function will improve. #3. Anemia. Hemoglobin on 213 was 9.0. She had post urologic intervention bleed and required transfusion. H&H needs to be checked. #4. 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 She has bilateral nephrostomy tubes. She is to followup with Dr. Diona Fanti. #5. Diabetes mellitus. Review of blood glucose levels for nursing nursing facility reveals good control. #6. History of bronchospasm. Wheezing has resolved since she has quit cigarette smoking.   Plan:  Patient will go to lab for CBC, metabolic 7 and INR. No changes made to her medications today. Her husband advised to call Dr.Dahlstedts office to schedule appointment. Office visit in 4 weeks.

## 2013-04-11 LAB — PROTIME-INR
INR: 2.5 — ABNORMAL HIGH (ref <1.50)
Prothrombin Time: 26.4 seconds — ABNORMAL HIGH (ref 11.6–15.2)

## 2013-04-13 ENCOUNTER — Other Ambulatory Visit: Payer: Self-pay | Admitting: Urology

## 2013-04-13 DIAGNOSIS — R1900 Intra-abdominal and pelvic swelling, mass and lump, unspecified site: Secondary | ICD-10-CM

## 2013-04-13 DIAGNOSIS — N2889 Other specified disorders of kidney and ureter: Secondary | ICD-10-CM

## 2013-04-13 DIAGNOSIS — N289 Disorder of kidney and ureter, unspecified: Secondary | ICD-10-CM

## 2013-04-17 ENCOUNTER — Encounter (HOSPITAL_COMMUNITY): Payer: Self-pay | Admitting: Pharmacy Technician

## 2013-04-17 ENCOUNTER — Other Ambulatory Visit: Payer: Self-pay | Admitting: Radiology

## 2013-04-21 ENCOUNTER — Other Ambulatory Visit: Payer: Self-pay | Admitting: Radiology

## 2013-04-21 ENCOUNTER — Ambulatory Visit (HOSPITAL_COMMUNITY)
Admission: RE | Admit: 2013-04-21 | Discharge: 2013-04-21 | Disposition: A | Discharge status: Home / Self Care | Payer: BC Managed Care – PPO | Source: Ambulatory Visit | Attending: Urology | Admitting: Urology

## 2013-04-21 ENCOUNTER — Encounter (HOSPITAL_COMMUNITY): Payer: Self-pay

## 2013-04-21 DIAGNOSIS — Z794 Long term (current) use of insulin: Secondary | ICD-10-CM | POA: Insufficient documentation

## 2013-04-21 DIAGNOSIS — Z9089 Acquired absence of other organs: Secondary | ICD-10-CM | POA: Insufficient documentation

## 2013-04-21 DIAGNOSIS — C679 Malignant neoplasm of bladder, unspecified: Secondary | ICD-10-CM | POA: Insufficient documentation

## 2013-04-21 DIAGNOSIS — R1900 Intra-abdominal and pelvic swelling, mass and lump, unspecified site: Secondary | ICD-10-CM

## 2013-04-21 DIAGNOSIS — E079 Disorder of thyroid, unspecified: Secondary | ICD-10-CM | POA: Insufficient documentation

## 2013-04-21 DIAGNOSIS — N2889 Other specified disorders of kidney and ureter: Secondary | ICD-10-CM

## 2013-04-21 DIAGNOSIS — Z7901 Long term (current) use of anticoagulants: Secondary | ICD-10-CM | POA: Insufficient documentation

## 2013-04-21 DIAGNOSIS — N289 Disorder of kidney and ureter, unspecified: Secondary | ICD-10-CM

## 2013-04-21 DIAGNOSIS — Z5309 Procedure and treatment not carried out because of other contraindication: Secondary | ICD-10-CM | POA: Insufficient documentation

## 2013-04-21 DIAGNOSIS — J4489 Other specified chronic obstructive pulmonary disease: Secondary | ICD-10-CM | POA: Insufficient documentation

## 2013-04-21 DIAGNOSIS — Z86718 Personal history of other venous thrombosis and embolism: Secondary | ICD-10-CM | POA: Insufficient documentation

## 2013-04-21 DIAGNOSIS — Z87448 Personal history of other diseases of urinary system: Secondary | ICD-10-CM | POA: Insufficient documentation

## 2013-04-21 DIAGNOSIS — Z01812 Encounter for preprocedural laboratory examination: Secondary | ICD-10-CM | POA: Insufficient documentation

## 2013-04-21 DIAGNOSIS — Z85038 Personal history of other malignant neoplasm of large intestine: Secondary | ICD-10-CM | POA: Insufficient documentation

## 2013-04-21 DIAGNOSIS — R1909 Other intra-abdominal and pelvic swelling, mass and lump: Secondary | ICD-10-CM | POA: Insufficient documentation

## 2013-04-21 DIAGNOSIS — I10 Essential (primary) hypertension: Secondary | ICD-10-CM | POA: Insufficient documentation

## 2013-04-21 DIAGNOSIS — Z936 Other artificial openings of urinary tract status: Secondary | ICD-10-CM | POA: Insufficient documentation

## 2013-04-21 DIAGNOSIS — Z87891 Personal history of nicotine dependence: Secondary | ICD-10-CM | POA: Insufficient documentation

## 2013-04-21 DIAGNOSIS — R609 Edema, unspecified: Secondary | ICD-10-CM | POA: Insufficient documentation

## 2013-04-21 DIAGNOSIS — E119 Type 2 diabetes mellitus without complications: Secondary | ICD-10-CM | POA: Insufficient documentation

## 2013-04-21 DIAGNOSIS — J449 Chronic obstructive pulmonary disease, unspecified: Secondary | ICD-10-CM | POA: Insufficient documentation

## 2013-04-21 LAB — CBC WITH DIFFERENTIAL/PLATELET
BASOS ABS: 0.1 10*3/uL (ref 0.0–0.1)
Basophils Relative: 1 % (ref 0–1)
EOS PCT: 2 % (ref 0–5)
Eosinophils Absolute: 0.2 10*3/uL (ref 0.0–0.7)
HCT: 39.1 % (ref 36.0–46.0)
Hemoglobin: 12.5 g/dL (ref 12.0–15.0)
LYMPHS ABS: 1.5 10*3/uL (ref 0.7–4.0)
LYMPHS PCT: 14 % (ref 12–46)
MCH: 26.9 pg (ref 26.0–34.0)
MCHC: 32 g/dL (ref 30.0–36.0)
MCV: 84.1 fL (ref 78.0–100.0)
Monocytes Absolute: 0.9 10*3/uL (ref 0.1–1.0)
Monocytes Relative: 8 % (ref 3–12)
NEUTROS ABS: 8.3 10*3/uL — AB (ref 1.7–7.7)
NEUTROS PCT: 75 % (ref 43–77)
PLATELETS: 444 10*3/uL — AB (ref 150–400)
RBC: 4.65 MIL/uL (ref 3.87–5.11)
RDW: 15.9 % — ABNORMAL HIGH (ref 11.5–15.5)
WBC: 11 10*3/uL — AB (ref 4.0–10.5)

## 2013-04-21 LAB — BASIC METABOLIC PANEL
BUN: 30 mg/dL — ABNORMAL HIGH (ref 6–23)
CO2: 22 meq/L (ref 19–32)
CREATININE: 1.61 mg/dL — AB (ref 0.50–1.10)
Calcium: 9.1 mg/dL (ref 8.4–10.5)
Chloride: 93 mEq/L — ABNORMAL LOW (ref 96–112)
GFR calc Af Amer: 35 mL/min — ABNORMAL LOW (ref >90)
GFR, EST NON AFRICAN AMERICAN: 30 mL/min — AB (ref >90)
Glucose, Bld: 108 mg/dL — ABNORMAL HIGH (ref 70–99)
Potassium: 5.2 mEq/L (ref 3.7–5.3)
SODIUM: 131 meq/L — AB (ref 137–147)

## 2013-04-21 LAB — PROTIME-INR
INR: 1.9 — AB (ref 0.00–1.49)
PROTHROMBIN TIME: 21.2 s — AB (ref 11.6–15.2)

## 2013-04-21 LAB — APTT: APTT: 38 s — AB (ref 24–37)

## 2013-04-21 LAB — GLUCOSE, CAPILLARY: GLUCOSE-CAPILLARY: 102 mg/dL — AB (ref 70–99)

## 2013-04-21 MED ORDER — DIPHENHYDRAMINE HCL 50 MG/ML IJ SOLN
50.0000 mg | Freq: Once | INTRAMUSCULAR | Status: DC
  Filled 2013-04-21: qty 1

## 2013-04-21 MED ORDER — SODIUM CHLORIDE 0.9 % IV SOLN
INTRAVENOUS | Status: DC
  Administered 2013-04-21: 08:00:00 via INTRAVENOUS

## 2013-04-21 MED ORDER — CIPROFLOXACIN IN D5W 400 MG/200ML IV SOLN: 400.0000 mg | Freq: Once | INTRAVENOUS | Status: DC

## 2013-04-21 MED ORDER — DIPHENHYDRAMINE HCL 50 MG/ML IJ SOLN
25.0000 mg | Freq: Once | INTRAMUSCULAR | Status: DC
  Filled 2013-04-21: qty 0.5

## 2013-04-21 MED ORDER — MIDAZOLAM HCL 2 MG/2ML IJ SOLN
INTRAMUSCULAR | Status: AC
  Filled 2013-04-21: qty 4

## 2013-04-21 MED ORDER — FENTANYL CITRATE 0.05 MG/ML IJ SOLN
INTRAMUSCULAR | Status: AC
  Filled 2013-04-21: qty 4

## 2013-04-21 NOTE — H&P (Signed)
Heather [name] is an 73 y.o. female.   Chief Complaint: "I'm having a biopsy and my kidney tubes changed" HPI: Patient with remote history of colon carcinoma and recently diagnosed bladder carcinoma with bilateral hydronephrosis/RP adenopathy, renal failure requiring bilateral nephrostomy tube placements on 03/24/2013. She presents today for CT guided RP mass/adenopathy biopsy and attempt at insertion of bilateral ureteral stents.  Past Medical History  Diagnosis Date  . Diabetes mellitus   . Hypertension   . Thyroid disease   . Colon cancer   . COPD (chronic obstructive pulmonary disease)   . MRSA infection   . Pulmonary embolus   . Deep vein thrombosis   . Pneumonia   . Rheumatic fever   . Dehydration   . MRSA (methicillin resistant staph aureus) culture positive   . Arthritis   . History of shingles 02/2010  . Colon cancer     Past Surgical History  Procedure Laterality Date  . Back surgery    . Partial hysterectomy    . Cholecystectomy    . Colostomy takedown    . Arthroscopic repair acl    . Hernia repair    . Breast cyst excision    . Gallbladder surgery    . Back surgery    . Bile duct exploration    . Bladder tact      '70s  . Cystitis    . Port-a-cath removal    . Abcess drainage      abdominal wall  . Transurethral resection of bladder tumor N/A 03/27/2013    Procedure: TRANSURETHRAL RESECTION OF BLADDER TUMOR (TURBT);  Surgeon: Franchot Gallo, MD;  Location: [facility];  Service: Urology;  Laterality: N/A;  . Cystoscopy/retrograde/ureteroscopy N/A 03/27/2013    Procedure: CYSTOSCOPY RETROGRADE PYELOGRAM POSSIBLE URETEROSCOPY;  Surgeon: Franchot Gallo, MD;  Location: [facility];  Service: Urology;  Laterality: N/A;    Family History  Problem Relation Age of Onset  . Heart disease Sister   . Heart disease Brother   . Healthy Daughter   . Healthy Son   . Heart disease Mother   . Heart disease Father    Social History:  reports that she quit smoking about 5  weeks ago. Her smoking use included Cigarettes. She has a 35.25 pack-year smoking history. She has never used smokeless tobacco. She reports that she does not drink alcohol or use illicit drugs.  Allergies:  Allergies  Allergen Reactions  . Motrin [Ibuprofen] Rash  . Sulfa Drugs Cross Reactors Rash  . Iohexol      Code: HIVES, Desc: pt states she has dye allergy, Onset Date: [id number]   . Quinine Derivatives Rash    Current outpatient prescriptions:acetaminophen (TYLENOL) 325 MG tablet, Take 2 tablets (650 mg total) by mouth every 6 (six) hours as needed for mild pain or fever (or Fever >/= 101)., Disp: , Rfl: ;  Amino Acids-Protein Hydrolys (FEEDING SUPPLEMENT, PRO-STAT SUGAR FREE 64,) LIQD, Take 30 mLs by mouth 2 (two) times daily., Disp: , Rfl:  bisacodyl (DULCOLAX) 10 MG suppository, Place 10 mg rectally daily as needed for moderate constipation., Disp: , Rfl: ;  Calcium Carbonate-Vit D-Min 600-200 MG-UNIT TABS, Take 1 tablet by mouth daily., Disp: , Rfl: ;  Cholecalciferol (D3 ADULT PO), Take 1,000 Units by mouth daily. , Disp: , Rfl: ;  cloNIDine (CATAPRES) 0.1 MG tablet, Take 1 tablet (0.1 mg total) by mouth daily., Disp: 60 tablet, Rfl: 11 docusate sodium 100 MG CAPS, Take 100 mg by mouth 2 (two) times  daily., Disp: 10 capsule, Rfl: 0;  ezetimibe (ZETIA) 10 MG tablet, Take 10 mg by mouth daily., Disp: , Rfl: ;  ferrous sulfate 325 (65 FE) MG tablet, Take 325 mg by mouth daily with breakfast., Disp: , Rfl: ;  furosemide (LASIX) 40 MG tablet, Take 1 tablet (40 mg total) by mouth daily., Disp: 30 tablet, Rfl:  HYDROcodone-acetaminophen (NORCO/VICODIN) 5-325 MG per tablet, Take 1-2 tablets by mouth every 4 (four) hours as needed for moderate pain., Disp: 30 tablet, Rfl: 0;  insulin glargine (LANTUS) 100 UNIT/ML injection, Inject 30 Units into the skin at bedtime., Disp: , Rfl: ;  ipratropium-albuterol (DUONEB) 0.5-2.5 (3) MG/3ML SOLN, Take 3 mLs by nebulization every 4 (four) hours as needed.,  Disp: 360 mL, Rfl:  levothyroxine (SYNTHROID, LEVOTHROID) 125 MCG tablet, Take 125 mcg by mouth daily before breakfast., Disp: , Rfl: ;  metoprolol succinate (TOPROL-XL) 50 MG 24 hr tablet, Take 50 mg by mouth 2 (two) times daily. Take with or immediately following a meal., Disp: , Rfl: ;  Multiple Vitamin (MULTIVITAMIN WITH MINERALS) TABS tablet, Take 1 tablet by mouth daily., Disp: , Rfl:  nystatin-triamcinolone (MYCOLOG II) cream, Apply 1 application topically 2 (two) times daily. Apply to area of a skin rash twice daily until rash cleared, Disp: 30 g, Rfl: 1;  Omega-3 Fatty Acids (FISH OIL) 1000 MG CAPS, Take 1,000 mg by mouth daily., Disp: , Rfl: ;  ondansetron (ZOFRAN ODT) 4 MG disintegrating tablet, Take 1 tablet (4 mg total) by mouth every 8 (eight) hours as needed for nausea or vomiting., Disp: 20 tablet, Rfl: 0 polyethylene glycol (MIRALAX / GLYCOLAX) packet, Take 17 g by mouth 2 (two) times daily., Disp: 14 each, Rfl: 0;  potassium chloride SA (K-DUR,KLOR-CON) 20 MEQ tablet, Take 20 mEq by mouth daily., Disp: , Rfl: ;  senna-docusate (SENOKOT-S) 8.6-50 MG per tablet, Take 1 tablet by mouth 2 (two) times daily., Disp: , Rfl: ;  sodium bicarbonate 650 MG tablet, Take 1 tablet (650 mg total) by mouth 2 (two) times daily., Disp: , Rfl:  warfarin (COUMADIN) 1 MG tablet, Take 1.5 mg by mouth daily., Disp: , Rfl:  No current facility-administered medications for this encounter. Facility-Administered Medications Ordered in Other Encounters: 0.9 %  sodium chloride infusion, , Intravenous, Continuous, Ascencion Dike, PA-C, Last Rate: 50 mL/hr at 04/21/13 0824;  ciprofloxacin (CIPRO) IVPB 400 mg, 400 mg, Intravenous, Once, Ascencion Dike, PA-C   Results for orders placed during the hospital encounter of 04/21/13 (from the past 48 hour(s))  CBC WITH DIFFERENTIAL     Status: Abnormal   Collection Time    04/21/13  7:53 AM      Result Value Ref Range   WBC 11.0 (*) 4.0 - 10.5 K/uL   RBC 4.65  3.87 - 5.11  MIL/uL   Hemoglobin 12.5  12.0 - 15.0 g/dL   HCT 39.1  36.0 - 46.0 %   MCV 84.1  78.0 - 100.0 fL   MCH 26.9  26.0 - 34.0 pg   MCHC 32.0  30.0 - 36.0 g/dL   RDW 15.9 (*) 11.5 - 15.5 %   Platelets 444 (*) 150 - 400 K/uL   Neutrophils Relative % 75  43 - 77 %   Neutro Abs 8.3 (*) 1.7 - 7.7 K/uL   Lymphocytes Relative 14  12 - 46 %   Lymphs Abs 1.5  0.7 - 4.0 K/uL   Monocytes Relative 8  3 - 12 %   Monocytes Absolute 0.9  0.1 - 1.0 K/uL   Eosinophils Relative 2  0 - 5 %   Eosinophils Absolute 0.2  0.0 - 0.7 K/uL   Basophils Relative 1  0 - 1 %   Basophils Absolute 0.1  0.0 - 0.1 K/uL   Results for orders placed during the hospital encounter of 04/21/13  APTT      Result Value Ref Range   aPTT 38 (*) 24 - 37 seconds  BASIC METABOLIC PANEL      Result Value Ref Range   Sodium 131 (*) 137 - 147 mEq/L   Potassium 5.2  3.7 - 5.3 mEq/L   Chloride 93 (*) 96 - 112 mEq/L   CO2 22  19 - 32 mEq/L   Glucose, Bld 108 (*) 70 - 99 mg/dL   BUN 30 (*) 6 - 23 mg/dL   Creatinine, Ser 7.62 (*) 0.50 - 1.10 mg/dL   Calcium 9.1  8.4 - 26.3 mg/dL   GFR calc non Af Amer 30 (*) >90 mL/min   GFR calc Af Amer 35 (*) >90 mL/min  CBC WITH DIFFERENTIAL      Result Value Ref Range   WBC 11.0 (*) 4.0 - 10.5 K/uL   RBC 4.65  3.87 - 5.11 MIL/uL   Hemoglobin 12.5  12.0 - 15.0 g/dL   HCT 33.5  45.6 - 25.6 %   MCV 84.1  78.0 - 100.0 fL   MCH 26.9  26.0 - 34.0 pg   MCHC 32.0  30.0 - 36.0 g/dL   RDW 38.9 (*) 37.3 - 42.8 %   Platelets 444 (*) 150 - 400 K/uL   Neutrophils Relative % 75  43 - 77 %   Neutro Abs 8.3 (*) 1.7 - 7.7 K/uL   Lymphocytes Relative 14  12 - 46 %   Lymphs Abs 1.5  0.7 - 4.0 K/uL   Monocytes Relative 8  3 - 12 %   Monocytes Absolute 0.9  0.1 - 1.0 K/uL   Eosinophils Relative 2  0 - 5 %   Eosinophils Absolute 0.2  0.0 - 0.7 K/uL   Basophils Relative 1  0 - 1 %   Basophils Absolute 0.1  0.0 - 0.1 K/uL  PROTIME-INR      Result Value Ref Range   Prothrombin Time 21.2 (*) 11.6 - 15.2  seconds   INR 1.90 (*) 0.00 - 1.49     Review of Systems  Constitutional: Negative for fever and chills.  Respiratory: Negative for cough, hemoptysis and shortness of breath.   Cardiovascular: Positive for leg swelling. Negative for chest pain.  Gastrointestinal: Positive for abdominal pain. Negative for nausea, vomiting and blood in stool.  Genitourinary: Positive for flank pain. Negative for hematuria.  Musculoskeletal: Positive for back pain.  Neurological: Negative for headaches.  Endo/Heme/Allergies: Does not bruise/bleed easily.    Vitals: BP 126/69  HR 99 R 18  TEMP 97.7  O2 SATS 97% RA Physical Exam  Constitutional: She is oriented to person, place, and time. She appears well-developed and well-nourished.  Cardiovascular: Normal rate and regular rhythm.   Respiratory: Effort normal.  Few rt basilar crackles, left clear  GI: Soft. Bowel sounds are normal.  Mild generalized tenderness  Genitourinary:  Bilateral nephrostomy tubes intact, draining yellow urine, surrounding skin sl erythematous secondary to adhesive  Musculoskeletal: Normal range of motion. She exhibits edema.  Neurological: She is alert and oriented to person, place, and time.     Assessment/Plan Patient with remote history of colon carcinoma  and recently diagnosed bladder carcinoma with bilateral hydronephrosis/RP adenopathy, renal failure requiring bilateral nephrostomy tube placements on 03/24/2013. She presented today for CT guided RP mass/adenopathy biopsy and attempt at insertion of bilateral ureteral stents. Details/risks of procedures d/w pt/husband with their understanding and consent. Due to elevated PT/INR (21.2/1.9) , the above procedures will be rescheduled for 4/6.   ALLRED,D KEVIN 04/21/2013, 8:22 AM

## 2013-04-21 NOTE — Progress Notes (Signed)
Patient from [facility] [phone]). Called Claiborne Billings (nurse at [facility]) and verified last time she took meds. Had coumadin last on Sat March 28. Lovenox 40mg  last on 4/1 - was NOT given yesterday. Patient was holding a drink of water upon arrival to Short Stay at [place]. Stated she has had about 8oz of water from 0600-0720 this am. No food this am. Will inform IR PA this am about water intake.

## 2013-04-21 NOTE — Progress Notes (Signed)
Reviewed instructions with patient and husband regarding continuing Lovenox and NOT taking coumadin again prior to returning for procedure Monday am at 1000.  Called Kelly (patient's nurse at [facility] nursing/rehab center) and carefully reviewed instructions with her regarding Lovenox/Coumadin, Insulin dose the night prior, and NPO status. Printed copy also sent with patient. Patient and husband left and patient transported back to nursing center via [facility] with [facility].

## 2013-04-21 NOTE — Discharge Instructions (Signed)
Remain off Coumadin in preparation for procedure Monday am.  Continue daily Lovenox shots until Sunday (preferably give dose in am).   Have patient NPO after midnight Sunday night 4/5. No food or drink Monday am.   Only take 1/2 of normal evening insulin dose on Sunday evening.   Call Interventional Radiology with ANY questions regarding above. [phone]

## 2013-04-24 ENCOUNTER — Ambulatory Visit (HOSPITAL_COMMUNITY)
Admission: RE | Admit: 2013-04-24 | Discharge: 2013-04-24 | Disposition: A | Discharge status: Home / Self Care | Payer: BC Managed Care – PPO | Source: Ambulatory Visit | Attending: Urology | Admitting: Urology

## 2013-04-24 ENCOUNTER — Other Ambulatory Visit: Payer: Self-pay | Admitting: Radiology

## 2013-04-24 ENCOUNTER — Other Ambulatory Visit: Payer: Self-pay | Admitting: Urology

## 2013-04-24 ENCOUNTER — Encounter (HOSPITAL_COMMUNITY): Payer: Self-pay

## 2013-04-24 DIAGNOSIS — Z436 Encounter for attention to other artificial openings of urinary tract: Secondary | ICD-10-CM | POA: Insufficient documentation

## 2013-04-24 DIAGNOSIS — J4489 Other specified chronic obstructive pulmonary disease: Secondary | ICD-10-CM | POA: Insufficient documentation

## 2013-04-24 DIAGNOSIS — Z86718 Personal history of other venous thrombosis and embolism: Secondary | ICD-10-CM | POA: Insufficient documentation

## 2013-04-24 DIAGNOSIS — Z79899 Other long term (current) drug therapy: Secondary | ICD-10-CM | POA: Insufficient documentation

## 2013-04-24 DIAGNOSIS — R599 Enlarged lymph nodes, unspecified: Secondary | ICD-10-CM | POA: Insufficient documentation

## 2013-04-24 DIAGNOSIS — I10 Essential (primary) hypertension: Secondary | ICD-10-CM | POA: Insufficient documentation

## 2013-04-24 DIAGNOSIS — R1900 Intra-abdominal and pelvic swelling, mass and lump, unspecified site: Secondary | ICD-10-CM

## 2013-04-24 DIAGNOSIS — Z8614 Personal history of Methicillin resistant Staphylococcus aureus infection: Secondary | ICD-10-CM | POA: Insufficient documentation

## 2013-04-24 DIAGNOSIS — N289 Disorder of kidney and ureter, unspecified: Secondary | ICD-10-CM

## 2013-04-24 DIAGNOSIS — Z794 Long term (current) use of insulin: Secondary | ICD-10-CM | POA: Insufficient documentation

## 2013-04-24 DIAGNOSIS — Z86711 Personal history of pulmonary embolism: Secondary | ICD-10-CM | POA: Insufficient documentation

## 2013-04-24 DIAGNOSIS — C969 Malignant neoplasm of lymphoid, hematopoietic and related tissue, unspecified: Secondary | ICD-10-CM | POA: Insufficient documentation

## 2013-04-24 DIAGNOSIS — C679 Malignant neoplasm of bladder, unspecified: Secondary | ICD-10-CM | POA: Insufficient documentation

## 2013-04-24 DIAGNOSIS — Z90710 Acquired absence of both cervix and uterus: Secondary | ICD-10-CM | POA: Insufficient documentation

## 2013-04-24 DIAGNOSIS — N1330 Unspecified hydronephrosis: Secondary | ICD-10-CM | POA: Insufficient documentation

## 2013-04-24 DIAGNOSIS — Z87891 Personal history of nicotine dependence: Secondary | ICD-10-CM | POA: Insufficient documentation

## 2013-04-24 DIAGNOSIS — J449 Chronic obstructive pulmonary disease, unspecified: Secondary | ICD-10-CM | POA: Insufficient documentation

## 2013-04-24 DIAGNOSIS — E119 Type 2 diabetes mellitus without complications: Secondary | ICD-10-CM | POA: Insufficient documentation

## 2013-04-24 DIAGNOSIS — Z85038 Personal history of other malignant neoplasm of large intestine: Secondary | ICD-10-CM | POA: Insufficient documentation

## 2013-04-24 LAB — CBC
HCT: 40 % (ref 36.0–46.0)
Hemoglobin: 12.6 g/dL (ref 12.0–15.0)
MCH: 26.8 pg (ref 26.0–34.0)
MCHC: 31.5 g/dL (ref 30.0–36.0)
MCV: 85.1 fL (ref 78.0–100.0)
PLATELETS: 459 10*3/uL — AB (ref 150–400)
RBC: 4.7 MIL/uL (ref 3.87–5.11)
RDW: 15.9 % — AB (ref 11.5–15.5)
WBC: 10.6 10*3/uL — AB (ref 4.0–10.5)

## 2013-04-24 LAB — BASIC METABOLIC PANEL
BUN: 27 mg/dL — ABNORMAL HIGH (ref 6–23)
CALCIUM: 9.5 mg/dL (ref 8.4–10.5)
CO2: 22 meq/L (ref 19–32)
Chloride: 93 mEq/L — ABNORMAL LOW (ref 96–112)
Creatinine, Ser: 1.5 mg/dL — ABNORMAL HIGH (ref 0.50–1.10)
GFR calc Af Amer: 38 mL/min — ABNORMAL LOW (ref >90)
GFR calc non Af Amer: 33 mL/min — ABNORMAL LOW (ref >90)
Glucose, Bld: 177 mg/dL — ABNORMAL HIGH (ref 70–99)
Potassium: 5.4 mEq/L — ABNORMAL HIGH (ref 3.7–5.3)
SODIUM: 133 meq/L — AB (ref 137–147)

## 2013-04-24 LAB — APTT: aPTT: 31 seconds (ref 24–37)

## 2013-04-24 LAB — PROTIME-INR
INR: 1.15 (ref 0.00–1.49)
PROTHROMBIN TIME: 14.5 s (ref 11.6–15.2)

## 2013-04-24 LAB — GLUCOSE, CAPILLARY: GLUCOSE-CAPILLARY: 164 mg/dL — AB (ref 70–99)

## 2013-04-24 MED ORDER — MIDAZOLAM HCL 2 MG/2ML IJ SOLN
INTRAMUSCULAR | Status: AC | PRN
  Administered 2013-04-24: 1 mg via INTRAVENOUS

## 2013-04-24 MED ORDER — ACETAMINOPHEN 325 MG PO TABS
650.0000 mg | ORAL_TABLET | Freq: Once | ORAL | Status: AC
  Administered 2013-04-24: 650 mg via ORAL
  Filled 2013-04-24: qty 2

## 2013-04-24 MED ORDER — FENTANYL CITRATE 0.05 MG/ML IJ SOLN
INTRAMUSCULAR | Status: AC
  Filled 2013-04-24: qty 8

## 2013-04-24 MED ORDER — FENTANYL CITRATE 0.05 MG/ML IJ SOLN
INTRAMUSCULAR | Status: AC | PRN
  Administered 2013-04-24 (×3): 50 ug via INTRAVENOUS

## 2013-04-24 MED ORDER — IOHEXOL 300 MG/ML  SOLN: 40.0000 mL | Freq: Once | INTRAMUSCULAR | Status: DC | PRN

## 2013-04-24 MED ORDER — DIPHENHYDRAMINE HCL 50 MG/ML IJ SOLN: 25.0000 mg | Freq: Once | INTRAMUSCULAR | Status: DC

## 2013-04-24 MED ORDER — LIDOCAINE-EPINEPHRINE (PF) 2 %-1:200000 IJ SOLN
INTRAMUSCULAR | Status: AC
  Filled 2013-04-24: qty 20

## 2013-04-24 MED ORDER — DIPHENHYDRAMINE HCL 50 MG/ML IJ SOLN
INTRAMUSCULAR | Status: AC | PRN
  Administered 2013-04-24: 25 mg via INTRAVENOUS

## 2013-04-24 MED ORDER — MIDAZOLAM HCL 2 MG/2ML IJ SOLN
INTRAMUSCULAR | Status: AC
  Filled 2013-04-24: qty 8

## 2013-04-24 MED ORDER — FENTANYL CITRATE 0.05 MG/ML IJ SOLN
INTRAMUSCULAR | Status: AC | PRN
  Administered 2013-04-24: 50 ug via INTRAVENOUS

## 2013-04-24 MED ORDER — DEXTROSE 5 % IV SOLN
1.0000 g | Freq: Once | INTRAVENOUS | Status: DC
  Filled 2013-04-24: qty 10

## 2013-04-24 MED ORDER — DIPHENHYDRAMINE HCL 50 MG/ML IJ SOLN
INTRAMUSCULAR | Status: AC
  Filled 2013-04-24: qty 1

## 2013-04-24 MED ORDER — SODIUM CHLORIDE 0.9 % IV SOLN: INTRAVENOUS | Status: DC

## 2013-04-24 NOTE — H&P (Signed)
Chief Complaint: "I am here for a biopsy and for my kidney tubes." Referring Physician: Dr. Diona Fanti HPI: Heather [name] is an 73 y.o. female with renal failure/obstructive bilateral hydronephrosis secondary to bladder cancer s/p bilateral PCN 03/24/13. She is scheduled today for image guided internalization of bilateral PCN, she states they have been draining well without any bleeding or significant pain at the sites. Dr. Diona Fanti has also ordered an image guided biopsy of retroperitoneal mass/adenopathy. She denies any chest pain, shortness of breath or palpitations. She denies any active signs of bleeding or excessive bruising. She denies any recent fever or chills. The patient denies any history of sleep apnea or chronic oxygen use. She has previously tolerated sedation without complications.   Past Medical History:  Past Medical History  Diagnosis Date  . Diabetes mellitus   . Hypertension   . Thyroid disease   . Colon cancer   . COPD (chronic obstructive pulmonary disease)   . MRSA infection   . Pulmonary embolus   . Deep vein thrombosis   . Pneumonia   . Rheumatic fever   . Dehydration   . MRSA (methicillin resistant staph aureus) culture positive   . Arthritis   . History of shingles 02/2010  . Colon cancer     Past Surgical History:  Past Surgical History  Procedure Laterality Date  . Back surgery    . Partial hysterectomy    . Cholecystectomy    . Colostomy takedown    . Arthroscopic repair acl    . Hernia repair    . Breast cyst excision    . Gallbladder surgery    . Back surgery    . Bile duct exploration    . Bladder tact      '70s  . Cystitis    . Port-a-cath removal    . Abcess drainage      abdominal wall  . Transurethral resection of bladder tumor N/A 03/27/2013    Procedure: TRANSURETHRAL RESECTION OF BLADDER TUMOR (TURBT);  Surgeon: Franchot Gallo, MD;  Location: [facility];  Service: Urology;  Laterality: N/A;  . Cystoscopy/retrograde/ureteroscopy N/A  03/27/2013    Procedure: CYSTOSCOPY RETROGRADE PYELOGRAM POSSIBLE URETEROSCOPY;  Surgeon: Franchot Gallo, MD;  Location: [facility];  Service: Urology;  Laterality: N/A;    Family History:  Family History  Problem Relation Age of Onset  . Heart disease Sister   . Heart disease Brother   . Healthy Daughter   . Healthy Son   . Heart disease Mother   . Heart disease Father     Social History:  reports that she quit smoking about 6 weeks ago. Her smoking use included Cigarettes. She has a 35.25 pack-year smoking history. She has never used smokeless tobacco. She reports that she does not drink alcohol or use illicit drugs.  Allergies:  Allergies  Allergen Reactions  . Motrin [Ibuprofen] Rash  . Sulfa Drugs Cross Reactors Rash  . Iohexol      Code: HIVES, Desc: pt states she has dye allergy, Onset Date: 57322025   . Quinine Derivatives Rash    Medications:   Medication List    ASK your doctor about these medications       acetaminophen 325 MG tablet  Commonly known as:  TYLENOL  Take 2 tablets (650 mg total) by mouth every 6 (six) hours as needed for mild pain or fever (or Fever >/= 101).     bisacodyl 10 MG suppository  Commonly known as:  DULCOLAX  Place  10 mg rectally daily as needed for moderate constipation.     Calcium Carbonate-Vit D-Min 600-200 MG-UNIT Tabs  Take 1 tablet by mouth daily.     cloNIDine 0.1 MG tablet  Commonly known as:  CATAPRES  Take 1 tablet (0.1 mg total) by mouth daily.     D3 ADULT PO  Take 1,000 Units by mouth daily.     DSS 100 MG Caps  Take 100 mg by mouth 2 (two) times daily.     ezetimibe 10 MG tablet  Commonly known as:  ZETIA  Take 10 mg by mouth daily.     feeding supplement (PRO-STAT SUGAR FREE 64) Liqd  Take 30 mLs by mouth 2 (two) times daily.     ferrous sulfate 325 (65 FE) MG tablet  Take 325 mg by mouth daily with breakfast.     Fish Oil 1000 MG Caps  Take 1,000 mg by mouth daily.     furosemide 40 MG tablet   Commonly known as:  LASIX  Take 1 tablet (40 mg total) by mouth daily.     HYDROcodone-acetaminophen 5-325 MG per tablet  Commonly known as:  NORCO/VICODIN  Take 1-2 tablets by mouth every 4 (four) hours as needed for moderate pain.     insulin glargine 100 UNIT/ML injection  Commonly known as:  LANTUS  Inject 30 Units into the skin at bedtime.     ipratropium-albuterol 0.5-2.5 (3) MG/3ML Soln  Commonly known as:  DUONEB  Take 3 mLs by nebulization every 4 (four) hours as needed.     levothyroxine 125 MCG tablet  Commonly known as:  SYNTHROID, LEVOTHROID  Take 125 mcg by mouth daily before breakfast.     metoprolol succinate 50 MG 24 hr tablet  Commonly known as:  TOPROL-XL  Take 50 mg by mouth 2 (two) times daily. Take with or immediately following a meal.     multivitamin with minerals Tabs tablet  Take 1 tablet by mouth daily.     nystatin-triamcinolone cream  Commonly known as:  MYCOLOG II  Apply 1 application topically 2 (two) times daily. Apply to area of a skin rash twice daily until rash cleared     ondansetron 4 MG disintegrating tablet  Commonly known as:  ZOFRAN ODT  Take 1 tablet (4 mg total) by mouth every 8 (eight) hours as needed for nausea or vomiting.     polyethylene glycol packet  Commonly known as:  MIRALAX / GLYCOLAX  Take 17 g by mouth 2 (two) times daily.     potassium chloride SA 20 MEQ tablet  Commonly known as:  K-DUR,KLOR-CON  Take 20 mEq by mouth daily.     senna-docusate 8.6-50 MG per tablet  Commonly known as:  Senokot-S  Take 1 tablet by mouth 2 (two) times daily.     sodium bicarbonate 650 MG tablet  Take 1 tablet (650 mg total) by mouth 2 (two) times daily.     warfarin 1 MG tablet  Commonly known as:  COUMADIN  Take 1.5 mg by mouth daily.       Please HPI for pertinent positives, otherwise complete 10 system ROS negative.  Physical Exam: T: 98.8, HR: 93bpm, BP: 129/70mHg, O2: 90%RA  General Appearance:  Alert,  cooperative, no distress  Head:  Normocephalic, without obvious abnormality, atraumatic  Neck: Supple, symmetrical, trachea midline  Lungs:   Clear to auscultation bilaterally, no w/r/r, respirations unlabored without use of accessory muscles.  Chest Wall:  No tenderness or  deformity  Heart:  Regular rate and rhythm, S1, S2 normal, no murmur, rub or gallop.  Back: Bilateral PCN intact, dressing C/D, yellow urine in bags no evidence of leaking or bleeding, NT.  Abdomen:   Soft, non-tender, non distended, (+) BS  Extremities: Extremities trace edema bilaterally, atraumatic, no cyanosis  Pulses: 1+ and symmetric  Neurologic: Normal affect, no gross deficits.   Results for orders placed during the hospital encounter of 04/24/13 (from the past 48 hour(s))  APTT     Status: None   Collection Time    04/24/13  9:30 AM      Result Value Ref Range   aPTT 31  24 - 37 seconds  BASIC METABOLIC PANEL     Status: Abnormal   Collection Time    04/24/13  9:30 AM      Result Value Ref Range   Sodium 133 (*) 137 - 147 mEq/L   Potassium 5.4 (*) 3.7 - 5.3 mEq/L   Comment: HEMOLYSIS AT THIS LEVEL MAY AFFECT RESULT     MODERATE HEMOLYSIS   Chloride 93 (*) 96 - 112 mEq/L   CO2 22  19 - 32 mEq/L   Glucose, Bld 177 (*) 70 - 99 mg/dL   BUN 27 (*) 6 - 23 mg/dL   Creatinine, Ser 1.50 (*) 0.50 - 1.10 mg/dL   Calcium 9.5  8.4 - 10.5 mg/dL   GFR calc non Af Amer 33 (*) >90 mL/min   GFR calc Af Amer 38 (*) >90 mL/min   Comment: (NOTE)     The eGFR has been calculated using the CKD EPI equation.     This calculation has not been validated in all clinical situations.     eGFR's persistently <90 mL/min signify possible Chronic Kidney     Disease.  CBC     Status: Abnormal   Collection Time    04/24/13  9:30 AM      Result Value Ref Range   WBC 10.6 (*) 4.0 - 10.5 K/uL   RBC 4.70  3.87 - 5.11 MIL/uL   Hemoglobin 12.6  12.0 - 15.0 g/dL   HCT 40.0  36.0 - 46.0 %   MCV 85.1  78.0 - 100.0 fL   MCH 26.8  26.0  - 34.0 pg   MCHC 31.5  30.0 - 36.0 g/dL   RDW 15.9 (*) 11.5 - 15.5 %   Platelets 459 (*) 150 - 400 K/uL  PROTIME-INR     Status: None   Collection Time    04/24/13  9:30 AM      Result Value Ref Range   Prothrombin Time 14.5  11.6 - 15.2 seconds   INR 1.15  0.00 - 1.49   No results found.  Assessment/Plan Renal failure/obstructive bilateral hydronephrosis secondary to bladder cancer s/p bilateral PCN 03/24/13 Scheduled today for image guided internalization of bilateral PCN. Patient has been NPO, labs reviewed INR 1.15, afebrile Retroperitoneal mass/adenopathy, request for image guided biopsy, images reviewed. Risks and Benefits discussed with the patient. All of the patient's questions were answered, patient is agreeable to proceed. Consent signed and in chart.   Tsosie Billing D PA-C 04/24/2013, 11:07 AM

## 2013-04-24 NOTE — Procedures (Signed)
Technically successful CT guided biopsy of indeterminate shotty left sided RP LN.  No immediate post procedural complications.   Technically successful placement of left sided double J ureteral stent and exchange of L sided PCN.   L sided PCN capped.  Unsuccessful attempted R sided double J ureteral stent placement due to complete obstruction of the R UPJ without opacification of the right ureter.  Successful R sided PCN exchange. R PCN placed to gravity bag.

## 2013-04-24 NOTE — Progress Notes (Addendum)
Called and spoke with Fraser Din, pt's nurse at [facility].  Discussed discharge instructions and upcoming appt. Fraser Din denied any further concerns or questions at this time.  Called [employer], they will call when they are waiting out front of hospital.  Discharge/med instructions reviewed with pt and family at bedside.  No further questions at this time.  Coolidge Breeze, RN 04/24/2013  Packet with discharge instructions and appointment paperwork sent with pt and faxed to [facility] with confirmation.  Coolidge Breeze, RN 04/24/2013  Pt discharged via [employer] at 1510.  Coolidge Breeze, RN 04/24/2013

## 2013-04-28 ENCOUNTER — Encounter (HOSPITAL_COMMUNITY): Payer: Self-pay | Admitting: Pharmacy Technician

## 2013-05-01 ENCOUNTER — Other Ambulatory Visit: Payer: Self-pay | Admitting: Urology

## 2013-05-01 ENCOUNTER — Ambulatory Visit (HOSPITAL_COMMUNITY)
Admission: RE | Admit: 2013-05-01 | Discharge: 2013-05-01 | Disposition: A | Discharge status: Home / Self Care | Payer: BC Managed Care – PPO | Source: Ambulatory Visit | Attending: Urology | Admitting: Urology

## 2013-05-01 ENCOUNTER — Encounter (HOSPITAL_COMMUNITY): Payer: Self-pay

## 2013-05-01 DIAGNOSIS — E119 Type 2 diabetes mellitus without complications: Secondary | ICD-10-CM | POA: Insufficient documentation

## 2013-05-01 DIAGNOSIS — N135 Crossing vessel and stricture of ureter without hydronephrosis: Secondary | ICD-10-CM | POA: Insufficient documentation

## 2013-05-01 DIAGNOSIS — R1900 Intra-abdominal and pelvic swelling, mass and lump, unspecified site: Secondary | ICD-10-CM

## 2013-05-01 DIAGNOSIS — C189 Malignant neoplasm of colon, unspecified: Secondary | ICD-10-CM | POA: Insufficient documentation

## 2013-05-01 DIAGNOSIS — Z86718 Personal history of other venous thrombosis and embolism: Secondary | ICD-10-CM | POA: Insufficient documentation

## 2013-05-01 DIAGNOSIS — N189 Chronic kidney disease, unspecified: Secondary | ICD-10-CM | POA: Insufficient documentation

## 2013-05-01 DIAGNOSIS — C679 Malignant neoplasm of bladder, unspecified: Secondary | ICD-10-CM

## 2013-05-01 DIAGNOSIS — N289 Disorder of kidney and ureter, unspecified: Secondary | ICD-10-CM

## 2013-05-01 DIAGNOSIS — Z86711 Personal history of pulmonary embolism: Secondary | ICD-10-CM | POA: Insufficient documentation

## 2013-05-01 DIAGNOSIS — N1330 Unspecified hydronephrosis: Secondary | ICD-10-CM | POA: Insufficient documentation

## 2013-05-01 DIAGNOSIS — I129 Hypertensive chronic kidney disease with stage 1 through stage 4 chronic kidney disease, or unspecified chronic kidney disease: Secondary | ICD-10-CM | POA: Insufficient documentation

## 2013-05-01 DIAGNOSIS — J449 Chronic obstructive pulmonary disease, unspecified: Secondary | ICD-10-CM | POA: Insufficient documentation

## 2013-05-01 DIAGNOSIS — J4489 Other specified chronic obstructive pulmonary disease: Secondary | ICD-10-CM | POA: Insufficient documentation

## 2013-05-01 LAB — BASIC METABOLIC PANEL
BUN: 30 mg/dL — ABNORMAL HIGH (ref 6–23)
CO2: 25 mEq/L (ref 19–32)
CREATININE: 1.62 mg/dL — AB (ref 0.50–1.10)
Calcium: 9.4 mg/dL (ref 8.4–10.5)
Chloride: 95 mEq/L — ABNORMAL LOW (ref 96–112)
GFR, EST AFRICAN AMERICAN: 35 mL/min — AB (ref >90)
GFR, EST NON AFRICAN AMERICAN: 30 mL/min — AB (ref >90)
Glucose, Bld: 189 mg/dL — ABNORMAL HIGH (ref 70–99)
Potassium: 4.9 mEq/L (ref 3.7–5.3)
Sodium: 133 mEq/L — ABNORMAL LOW (ref 137–147)

## 2013-05-01 LAB — CBC
HEMATOCRIT: 37 % (ref 36.0–46.0)
Hemoglobin: 11.6 g/dL — ABNORMAL LOW (ref 12.0–15.0)
MCH: 26.4 pg (ref 26.0–34.0)
MCHC: 31.4 g/dL (ref 30.0–36.0)
MCV: 84.3 fL (ref 78.0–100.0)
PLATELETS: 305 10*3/uL (ref 150–400)
RBC: 4.39 MIL/uL (ref 3.87–5.11)
RDW: 16.5 % — AB (ref 11.5–15.5)
WBC: 8.9 10*3/uL (ref 4.0–10.5)

## 2013-05-01 LAB — GLUCOSE, CAPILLARY
GLUCOSE-CAPILLARY: 130 mg/dL — AB (ref 70–99)
Glucose-Capillary: 184 mg/dL — ABNORMAL HIGH (ref 70–99)

## 2013-05-01 LAB — PROTIME-INR
INR: 1.33 (ref 0.00–1.49)
PROTHROMBIN TIME: 16.2 s — AB (ref 11.6–15.2)

## 2013-05-01 MED ORDER — SODIUM CHLORIDE 0.9 % IV SOLN
INTRAVENOUS | Status: DC
  Administered 2013-05-01: 09:00:00 via INTRAVENOUS

## 2013-05-01 MED ORDER — MIDAZOLAM HCL 2 MG/2ML IJ SOLN
INTRAMUSCULAR | Status: AC
  Filled 2013-05-01: qty 6

## 2013-05-01 MED ORDER — LIDOCAINE HCL 1 % IJ SOLN
INTRAMUSCULAR | Status: AC
  Filled 2013-05-01: qty 20

## 2013-05-01 MED ORDER — FENTANYL CITRATE 0.05 MG/ML IJ SOLN
INTRAMUSCULAR | Status: AC | PRN
  Administered 2013-05-01 (×2): 25 ug via INTRAVENOUS

## 2013-05-01 MED ORDER — MIDAZOLAM HCL 2 MG/2ML IJ SOLN
INTRAMUSCULAR | Status: AC | PRN
  Administered 2013-05-01 (×2): 0.5 mg via INTRAVENOUS

## 2013-05-01 MED ORDER — IOHEXOL 300 MG/ML  SOLN
10.0000 mL | Freq: Once | INTRAMUSCULAR | Status: AC | PRN
  Administered 2013-05-01: 1 mL

## 2013-05-01 MED ORDER — DEXTROSE 5 % IV SOLN
1.0000 g | Freq: Once | INTRAVENOUS | Status: AC
  Administered 2013-05-01: 1 g via INTRAVENOUS
  Filled 2013-05-01: qty 10

## 2013-05-01 MED ORDER — FENTANYL CITRATE 0.05 MG/ML IJ SOLN
INTRAMUSCULAR | Status: AC
  Filled 2013-05-01: qty 6

## 2013-05-01 NOTE — Progress Notes (Signed)
K Bruning PA in to check on patient. No new orders.

## 2013-05-01 NOTE — Progress Notes (Signed)
Report given to Claiborne Billings, Nurse taking care of patient at [facility]

## 2013-05-01 NOTE — Procedures (Signed)
Successful LT PCN REMOVAL  RT PCN WAS EXCHANGED AFTER FAILED 2ND ATTEMPT AT RT INTERNALIZATION  NO COMP STABLE

## 2013-05-01 NOTE — Discharge Instructions (Signed)
Percutaneous Nephrostomy, Care After Refer to this sheet in the next few weeks. These instructions provide you with information on caring for yourself after your procedure. Your health care provider may also give you more specific instructions. Your treatment has been planned according to current medical practices, but problems sometimes occur. Call your health care provider if you have any problems or questions after your procedure. WHAT TO EXPECT AFTER THE PROCEDURE  You will need to remain lying down for several hours. Some people are able to leave the hospital the same day, others stay overnight. HOME CARE INSTRUCTIONS  Your nephrostomy tube is connected to a leg bag or bedside drainage bag. Always keep the tubing, the leg bag, or the bedside drainage bags below the level of the kidney so that the urine drains freely.  During the day, if you are connecting the nephrostomy tube to a leg bag, be sure there are no kinks in the tubing and that the urine is draining freely.  At night, you may want to connect the nephrostomy tube or the leg bag to a larger bedside drainage bag.  Change the dressing as often as directed by your health care provider or if it becomes wet.  Gently remove the tapes and dressing from around the nephrostomy tube. Be careful not to pull on the tube while removing the dressing.  Wash the skin around the tube, rinse well and dry.  Place two split drain sponges in and around the tube exit site.  Place tape around edge of the dressing.  Secure the nephrostomy tubing. Remember to make certain that the nephrostomy tube does not kink or become pinched closed. It can be useful to wrap any exposed tubing going from the nephrostomy tube to any of the connecting tubes to either the leg bag or drainage bag with an elastic bandage.  Every three weeks, replace the leg bag, drainage bag, and any extension tubing connected to your nephrostomy tube. Your health care provider will  explain how to change the drainage bag and extension tubing. SEEK MEDICAL CARE IF:  You experience any problems with any of the valves or tubing.  You have persistent pain or soreness in your back. SEEK IMMEDIATE MEDICAL CARE IF:  You have a fever or chills.  You have abdominal pain during the first week.  You have a new appearance of blood in urine.  You have back pain that is not relieved by your medicine.  You have redness, swelling, or drainage at the tube insertion site.  You have decreased urine output.  Your nephrostomy tube comes out. Document Released: 08/29/2003 Document Revised: 10/26/2012 Document Reviewed: 09/01/2012 [facility] Patient Information 2014 [place], [place]. Moderate Sedation, Adult Moderate sedation is given to help you relax or even sleep through a procedure. You may remain sleepy, be clumsy, or have poor balance for several hours following this procedure. Arrange for a responsible adult, family member, or friend to take you home. A responsible adult should stay with you for at least 24 hours or until the medicines have worn off.  Do not participate in any activities where you could become injured for the next 24 hours, or until you feel normal again. Do not:  Drive.  Swim.  Ride a bicycle.  Operate heavy machinery.  Cook.  Use power tools.  Climb ladders.  Work at [employer].  Do not make important decisions or sign legal documents until you are improved.  Vomiting may occur if you eat too soon. When you can  drink without vomiting, try water, juice, or soup. Try solid foods if you feel little or no nausea.  Only take over-the-counter or prescription medications for pain, discomfort, or fever as directed by your caregiver.If pain medications have been prescribed for you, ask your caregiver how soon it is safe to take them.  Make sure you and your family fully understands everything about the medication given to you. Make sure you understand what  side effects may occur.  You should not drink alcohol, take sleeping pills, or medications that cause drowsiness for at least 24 hours.  If you smoke, do not smoke alone.  If you are feeling better, you may resume normal activities 24 hours after receiving sedation.  Keep all appointments as scheduled. Follow all instructions.  Ask questions if you do not understand. SEEK MEDICAL CARE IF:   Your skin is pale or bluish in color.  You continue to feel sick to your stomach (nauseous) or throw up (vomit).  Your pain is getting worse and not helped by medication.  You have bleeding or swelling.  You are still sleepy or feeling clumsy after 24 hours. SEEK IMMEDIATE MEDICAL CARE IF:   You develop a rash.  You have difficulty breathing.  You develop any type of allergic problem.  You have a fever. Document Released: 09/30/2000 Document Revised: 03/30/2011 Document Reviewed: 09/12/2012 [facility] Patient Information 2014 [place].

## 2013-05-01 NOTE — Progress Notes (Signed)
Transported to [facility] via w/c  By [facility] transport

## 2013-05-01 NOTE — H&P (Signed)
Chief Complaint: "I am here for my kidney tubes." Referring Physician: Dr. Retta Diones HPI: Heather [name] is an 73 y.o. female with renal failure/obstructive bilateral hydronephrosis secondary to bladder cancer s/p bilateral PCN 03/24/13.  She has had (B) PCN. She has then had subsequent (L)JJ stent placement and exchange of (B)PCN. She is here today for (L)nephrostogram and possible PCN removal as well as another attempt at (R)ureteral stent placement. No changes to PMHx or meds.   Past Medical History:  Past Medical History  Diagnosis Date  . Hypertension   . Thyroid disease   . Colon cancer   . COPD (chronic obstructive pulmonary disease)   . MRSA infection   . Pulmonary embolus   . Deep vein thrombosis   . Pneumonia   . Rheumatic fever   . Dehydration   . MRSA (methicillin resistant staph aureus) culture positive   . Arthritis   . History of shingles 02/2010  . Colon cancer   . Diabetes mellitus     Past Surgical History:  Past Surgical History  Procedure Laterality Date  . Back surgery    . Partial hysterectomy    . Cholecystectomy    . Colostomy takedown    . Arthroscopic repair acl    . Hernia repair    . Breast cyst excision    . Gallbladder surgery    . Back surgery    . Bile duct exploration    . Bladder tact      '70s  . Cystitis    . Port-a-cath removal    . Abcess drainage      abdominal wall  . Transurethral resection of bladder tumor N/A 03/27/2013    Procedure: TRANSURETHRAL RESECTION OF BLADDER TUMOR (TURBT);  Surgeon: Marcine Matar, MD;  Location: [facility];  Service: Urology;  Laterality: N/A;  . Cystoscopy/retrograde/ureteroscopy N/A 03/27/2013    Procedure: CYSTOSCOPY RETROGRADE PYELOGRAM POSSIBLE URETEROSCOPY;  Surgeon: Marcine Matar, MD;  Location: [facility];  Service: Urology;  Laterality: N/A;    Family History:  Family History  Problem Relation Age of Onset  . Heart disease Sister   . Heart disease Brother   . Healthy Daughter   . Healthy  Son   . Heart disease Mother   . Heart disease Father     Social History:  reports that she quit smoking about 7 weeks ago. Her smoking use included Cigarettes. She has a 35.25 pack-year smoking history. She has never used smokeless tobacco. She reports that she does not drink alcohol or use illicit drugs.  Allergies:  Allergies  Allergen Reactions  . Motrin [Ibuprofen] Rash  . Sulfa Drugs Cross Reactors Rash  . Iohexol      Code: HIVES, Desc: pt states she has dye allergy, Onset Date: 81278108   . Quinine Derivatives Rash    Medications:   Medication List    ASK your doctor about these medications       acetaminophen 325 MG tablet  Commonly known as:  TYLENOL  Take 2 tablets (650 mg total) by mouth every 6 (six) hours as needed for mild pain or fever (or Fever >/= 101).     bisacodyl 10 MG suppository  Commonly known as:  DULCOLAX  Place 10 mg rectally daily as needed for moderate constipation.     Calcium Carbonate-Vit D-Min 600-200 MG-UNIT Tabs  Take 1 tablet by mouth daily.     cloNIDine 0.1 MG tablet  Commonly known as:  CATAPRES  Take 1 tablet (0.1 mg  total) by mouth daily.     D3 ADULT PO  Take 1,000 Units by mouth daily.     DSS 100 MG Caps  Take 100 mg by mouth 2 (two) times daily.     enoxaparin 40 MG/0.4ML injection  Commonly known as:  LOVENOX  Inject 40 mg into the skin daily.     ezetimibe 10 MG tablet  Commonly known as:  ZETIA  Take 10 mg by mouth daily.     feeding supplement (PRO-STAT SUGAR FREE 64) Liqd  Take 30 mLs by mouth 2 (two) times daily.     ferrous sulfate 325 (65 FE) MG tablet  Take 325 mg by mouth daily with breakfast.     Fish Oil 1000 MG Caps  Take 1,000 mg by mouth daily.     furosemide 40 MG tablet  Commonly known as:  LASIX  Take 1 tablet (40 mg total) by mouth daily.     HYDROcodone-acetaminophen 5-325 MG per tablet  Commonly known as:  NORCO/VICODIN  Take 1-2 tablets by mouth every 4 (four) hours as needed for  moderate pain.     insulin glargine 100 UNIT/ML injection  Commonly known as:  LANTUS  Inject 30 Units into the skin at bedtime.     ipratropium-albuterol 0.5-2.5 (3) MG/3ML Soln  Commonly known as:  DUONEB  Take 3 mLs by nebulization every 4 (four) hours as needed.     levothyroxine 125 MCG tablet  Commonly known as:  SYNTHROID, LEVOTHROID  Take 125 mcg by mouth daily before breakfast.     metoprolol succinate 50 MG 24 hr tablet  Commonly known as:  TOPROL-XL  Take 50 mg by mouth 2 (two) times daily. Take with or immediately following a meal.     multivitamin with minerals Tabs tablet  Take 1 tablet by mouth daily.     nystatin-triamcinolone cream  Commonly known as:  MYCOLOG II  Apply 1 application topically 2 (two) times daily. Apply to area of a skin rash twice daily until rash cleared     ondansetron 4 MG disintegrating tablet  Commonly known as:  ZOFRAN ODT  Take 1 tablet (4 mg total) by mouth every 8 (eight) hours as needed for nausea or vomiting.     polyethylene glycol packet  Commonly known as:  MIRALAX / GLYCOLAX  Take 17 g by mouth 2 (two) times daily.     potassium chloride SA 20 MEQ tablet  Commonly known as:  K-DUR,KLOR-CON  Take 20 mEq by mouth daily.     senna-docusate 8.6-50 MG per tablet  Commonly known as:  Senokot-S  Take 1 tablet by mouth 2 (two) times daily.     sodium bicarbonate 650 MG tablet  Take 1 tablet (650 mg total) by mouth 2 (two) times daily.     warfarin 1 MG tablet  Commonly known as:  COUMADIN  Take 1.5 mg by mouth daily.       Please HPI for pertinent positives, otherwise complete 10 system ROS negative.  Physical Exam: T: 98.8, HR: 93bpm, BP: 129/26mmHg, O2: 90%RA  General Appearance:  Alert, cooperative, no distress  Head:  Normocephalic, without obvious abnormality, atraumatic  Neck: Supple, symmetrical, trachea midline  Lungs:   Clear to auscultation bilaterally, no w/r/r, respirations unlabored without use of  accessory muscles.  Chest Wall:  No tenderness or deformity  Heart:  Regular rate and rhythm, S1, S2 normal, no murmur, rub or gallop.  Back: Bilateral PCN intact, dressing C/D, yellow  urine in (R)bag, (L) side capped.   no evidence of leaking or bleeding, NT.  Abdomen:   Soft, non-tender, non distended, (+) BS  Extremities: Extremities trace edema bilaterally, atraumatic, no cyanosis  Pulses: 1+ and symmetric  Neurologic: Normal affect, no gross deficits.   Results for orders placed during the hospital encounter of 05/01/13 (from the past 48 hour(s))  BASIC METABOLIC PANEL     Status: Abnormal   Collection Time    05/01/13  8:30 AM      Result Value Ref Range   Sodium 133 (*) 137 - 147 mEq/L   Potassium 4.9  3.7 - 5.3 mEq/L   Chloride 95 (*) 96 - 112 mEq/L   CO2 25  19 - 32 mEq/L   Glucose, Bld 189 (*) 70 - 99 mg/dL   BUN 30 (*) 6 - 23 mg/dL   Creatinine, Ser 1.62 (*) 0.50 - 1.10 mg/dL   Calcium 9.4  8.4 - 10.5 mg/dL   GFR calc non Af Amer 30 (*) >90 mL/min   GFR calc Af Amer 35 (*) >90 mL/min   Comment: (NOTE)     The eGFR has been calculated using the CKD EPI equation.     This calculation has not been validated in all clinical situations.     eGFR's persistently <90 mL/min signify possible Chronic Kidney     Disease.  CBC     Status: Abnormal   Collection Time    05/01/13  8:30 AM      Result Value Ref Range   WBC 8.9  4.0 - 10.5 K/uL   RBC 4.39  3.87 - 5.11 MIL/uL   Hemoglobin 11.6 (*) 12.0 - 15.0 g/dL   HCT 37.0  36.0 - 46.0 %   MCV 84.3  78.0 - 100.0 fL   MCH 26.4  26.0 - 34.0 pg   MCHC 31.4  30.0 - 36.0 g/dL   RDW 16.5 (*) 11.5 - 15.5 %   Platelets 305  150 - 400 K/uL  PROTIME-INR     Status: Abnormal   Collection Time    05/01/13  8:30 AM      Result Value Ref Range   Prothrombin Time 16.2 (*) 11.6 - 15.2 seconds   INR 1.33  0.00 - 1.49   No results found.  Assessment/Plan Renal failure/obstructive bilateral hydronephrosis secondary to bladder cancer s/p  bilateral PCN 03/24/13 Scheduled today for attempt  image guided internalization ofright PCN, (L)nephrostogram and removal of (L)PCN Patient has been NPO, labs reviewed INR 1.15, afebrile Retroperitoneal mass/adenopathy, request for image guided biopsy, images reviewed. Risks and Benefits discussed with the patient. All of the patient's questions were answered, patient is agreeable to proceed. Consent signed and in chart.   Ascencion Dike PA-C 05/01/2013, 9:44 AM

## 2013-05-01 NOTE — Progress Notes (Signed)
Spoke w Claiborne Billings , patients Nurse at [facility]. Confirmed NPO status and last time and dates of all meds and supplements

## 2013-05-16 ENCOUNTER — Telehealth (INDEPENDENT_AMBULATORY_CARE_PROVIDER_SITE_OTHER): Payer: Self-pay | Admitting: *Deleted

## 2013-05-16 NOTE — Telephone Encounter (Signed)
Patient should have her pharmacy send to [place] refill request. Patient will be called and made aware of this.

## 2013-05-16 NOTE — Telephone Encounter (Signed)
Meygan came home from the nursing home and is needing her medications refilled. The return phone number is [phone].

## 2013-05-17 NOTE — Telephone Encounter (Signed)
Patient called back - was advised to call her pharmacy and have them fax the refill request to our office. Voices understood.

## 2013-05-17 NOTE — Telephone Encounter (Signed)
I have spoke with Opal Sidles at [facility]. She is going to fax the medications to [place].

## 2013-05-18 ENCOUNTER — Telehealth (INDEPENDENT_AMBULATORY_CARE_PROVIDER_SITE_OTHER): Payer: Self-pay | Admitting: *Deleted

## 2013-05-18 ENCOUNTER — Other Ambulatory Visit (INDEPENDENT_AMBULATORY_CARE_PROVIDER_SITE_OTHER): Payer: Self-pay | Admitting: *Deleted

## 2013-05-18 MED ORDER — WARFARIN SODIUM 1 MG PO TABS: 1.0000 mg | ORAL_TABLET | Freq: Every day | ORAL | Status: DC

## 2013-05-18 MED ORDER — LEVOTHYROXINE SODIUM 125 MCG PO TABS: 125.0000 ug | ORAL_TABLET | Freq: Every day | ORAL | Status: AC

## 2013-05-18 MED ORDER — FUROSEMIDE 40 MG PO TABS: 40.0000 mg | ORAL_TABLET | Freq: Every day | ORAL | Status: DC

## 2013-05-18 MED ORDER — POTASSIUM CHLORIDE CRYS ER 20 MEQ PO TBCR: 20.0000 meq | EXTENDED_RELEASE_TABLET | Freq: Every day | ORAL | Status: DC

## 2013-05-18 NOTE — Telephone Encounter (Signed)
Patient's grand daughter called in yesterday about 4:30 pm per Butch Penny. This is about her medications. I have called this morning and ask that she call us back so that we may take care of this.Marland Kitchen

## 2013-05-18 NOTE — Telephone Encounter (Signed)
Patient's granddaughter, Rebecca,called and states that patient needs these medications refilled. She was recently discharged from [facility] on April 27th. She states that she has just started taking the 1 mg of Coumadin. She was alternating it 0.5 mg one day then 1 mg the next. Patient did start back on the 1 mg 05-17-13 per Wells Guiles.

## 2013-05-19 ENCOUNTER — Other Ambulatory Visit (INDEPENDENT_AMBULATORY_CARE_PROVIDER_SITE_OTHER): Payer: Self-pay | Admitting: *Deleted

## 2013-05-19 ENCOUNTER — Telehealth (INDEPENDENT_AMBULATORY_CARE_PROVIDER_SITE_OTHER): Payer: Self-pay | Admitting: *Deleted

## 2013-05-19 NOTE — Telephone Encounter (Signed)
Per Dr.Rehman the patient may stay on the dose that she is on. Per her grand daughter, Wells Guiles , she is currently taking 1 mg daily.Her Nurse from [facility] was called and made aware as was the patient.

## 2013-05-19 NOTE — Telephone Encounter (Signed)
Amy from [facility] called and said Heather [name]'s PT/INR levels were 2.3. Her return phone number is [phone].

## 2013-05-21 DIAGNOSIS — Z86711 Personal history of pulmonary embolism: Secondary | ICD-10-CM

## 2013-05-21 DIAGNOSIS — N390 Urinary tract infection, site not specified: Secondary | ICD-10-CM | POA: Diagnosis present

## 2013-05-21 DIAGNOSIS — E039 Hypothyroidism, unspecified: Secondary | ICD-10-CM | POA: Diagnosis present

## 2013-05-21 DIAGNOSIS — I509 Heart failure, unspecified: Secondary | ICD-10-CM | POA: Diagnosis present

## 2013-05-21 DIAGNOSIS — Z794 Long term (current) use of insulin: Secondary | ICD-10-CM

## 2013-05-21 DIAGNOSIS — C7931 Secondary malignant neoplasm of brain: Secondary | ICD-10-CM | POA: Diagnosis present

## 2013-05-21 DIAGNOSIS — Z86718 Personal history of other venous thrombosis and embolism: Secondary | ICD-10-CM

## 2013-05-21 DIAGNOSIS — J189 Pneumonia, unspecified organism: Secondary | ICD-10-CM | POA: Diagnosis present

## 2013-05-21 DIAGNOSIS — N183 Chronic kidney disease, stage 3 unspecified: Secondary | ICD-10-CM | POA: Diagnosis present

## 2013-05-21 DIAGNOSIS — C679 Malignant neoplasm of bladder, unspecified: Secondary | ICD-10-CM | POA: Diagnosis present

## 2013-05-21 DIAGNOSIS — I5032 Chronic diastolic (congestive) heart failure: Secondary | ICD-10-CM | POA: Diagnosis present

## 2013-05-21 DIAGNOSIS — Z85038 Personal history of other malignant neoplasm of large intestine: Secondary | ICD-10-CM

## 2013-05-21 DIAGNOSIS — I129 Hypertensive chronic kidney disease with stage 1 through stage 4 chronic kidney disease, or unspecified chronic kidney disease: Secondary | ICD-10-CM | POA: Diagnosis present

## 2013-05-21 DIAGNOSIS — K5900 Constipation, unspecified: Secondary | ICD-10-CM | POA: Diagnosis present

## 2013-05-21 DIAGNOSIS — Z66 Do not resuscitate: Secondary | ICD-10-CM | POA: Diagnosis not present

## 2013-05-21 DIAGNOSIS — Z515 Encounter for palliative care: Secondary | ICD-10-CM

## 2013-05-21 DIAGNOSIS — Z87891 Personal history of nicotine dependence: Secondary | ICD-10-CM

## 2013-05-21 DIAGNOSIS — N179 Acute kidney failure, unspecified: Principal | ICD-10-CM | POA: Diagnosis present

## 2013-05-21 DIAGNOSIS — C7949 Secondary malignant neoplasm of other parts of nervous system: Secondary | ICD-10-CM

## 2013-05-21 DIAGNOSIS — Z7901 Long term (current) use of anticoagulants: Secondary | ICD-10-CM

## 2013-05-22 ENCOUNTER — Inpatient Hospital Stay (HOSPITAL_COMMUNITY)
Admission: EM | Admit: 2013-05-22 | Discharge: 2013-05-23 | DRG: 682 | Disposition: A | Discharge status: Hospice | Payer: BC Managed Care – PPO | Attending: Internal Medicine | Admitting: Internal Medicine

## 2013-05-22 ENCOUNTER — Emergency Department (HOSPITAL_COMMUNITY): Payer: BC Managed Care – PPO

## 2013-05-22 ENCOUNTER — Encounter (HOSPITAL_COMMUNITY): Payer: Self-pay | Admitting: Emergency Medicine

## 2013-05-22 ENCOUNTER — Telehealth (INDEPENDENT_AMBULATORY_CARE_PROVIDER_SITE_OTHER): Payer: Self-pay | Admitting: *Deleted

## 2013-05-22 ENCOUNTER — Inpatient Hospital Stay (HOSPITAL_COMMUNITY): Payer: BC Managed Care – PPO

## 2013-05-22 DIAGNOSIS — I10 Essential (primary) hypertension: Secondary | ICD-10-CM | POA: Diagnosis present

## 2013-05-22 DIAGNOSIS — K5900 Constipation, unspecified: Secondary | ICD-10-CM | POA: Diagnosis present

## 2013-05-22 DIAGNOSIS — F17200 Nicotine dependence, unspecified, uncomplicated: Secondary | ICD-10-CM

## 2013-05-22 DIAGNOSIS — IMO0001 Reserved for inherently not codable concepts without codable children: Secondary | ICD-10-CM

## 2013-05-22 DIAGNOSIS — J189 Pneumonia, unspecified organism: Secondary | ICD-10-CM

## 2013-05-22 DIAGNOSIS — C189 Malignant neoplasm of colon, unspecified: Secondary | ICD-10-CM | POA: Diagnosis present

## 2013-05-22 DIAGNOSIS — Z515 Encounter for palliative care: Secondary | ICD-10-CM

## 2013-05-22 DIAGNOSIS — N139 Obstructive and reflux uropathy, unspecified: Secondary | ICD-10-CM | POA: Diagnosis present

## 2013-05-22 DIAGNOSIS — R531 Weakness: Secondary | ICD-10-CM

## 2013-05-22 DIAGNOSIS — E039 Hypothyroidism, unspecified: Secondary | ICD-10-CM

## 2013-05-22 DIAGNOSIS — R0902 Hypoxemia: Secondary | ICD-10-CM | POA: Diagnosis present

## 2013-05-22 DIAGNOSIS — N289 Disorder of kidney and ureter, unspecified: Secondary | ICD-10-CM

## 2013-05-22 DIAGNOSIS — C679 Malignant neoplasm of bladder, unspecified: Secondary | ICD-10-CM | POA: Diagnosis present

## 2013-05-22 DIAGNOSIS — S31109A Unspecified open wound of abdominal wall, unspecified quadrant without penetration into peritoneal cavity, initial encounter: Secondary | ICD-10-CM

## 2013-05-22 DIAGNOSIS — Z86711 Personal history of pulmonary embolism: Secondary | ICD-10-CM | POA: Diagnosis present

## 2013-05-22 DIAGNOSIS — N390 Urinary tract infection, site not specified: Secondary | ICD-10-CM | POA: Diagnosis present

## 2013-05-22 DIAGNOSIS — N179 Acute kidney failure, unspecified: Principal | ICD-10-CM | POA: Diagnosis present

## 2013-05-22 DIAGNOSIS — Z87891 Personal history of nicotine dependence: Secondary | ICD-10-CM

## 2013-05-22 DIAGNOSIS — K922 Gastrointestinal hemorrhage, unspecified: Secondary | ICD-10-CM

## 2013-05-22 DIAGNOSIS — K920 Hematemesis: Secondary | ICD-10-CM | POA: Diagnosis present

## 2013-05-22 DIAGNOSIS — E119 Type 2 diabetes mellitus without complications: Secondary | ICD-10-CM | POA: Diagnosis present

## 2013-05-22 DIAGNOSIS — G893 Neoplasm related pain (acute) (chronic): Secondary | ICD-10-CM

## 2013-05-22 DIAGNOSIS — I739 Peripheral vascular disease, unspecified: Secondary | ICD-10-CM

## 2013-05-22 DIAGNOSIS — N189 Chronic kidney disease, unspecified: Secondary | ICD-10-CM

## 2013-05-22 DIAGNOSIS — N1330 Unspecified hydronephrosis: Secondary | ICD-10-CM

## 2013-05-22 DIAGNOSIS — D649 Anemia, unspecified: Secondary | ICD-10-CM | POA: Diagnosis present

## 2013-05-22 DIAGNOSIS — J9600 Acute respiratory failure, unspecified whether with hypoxia or hypercapnia: Secondary | ICD-10-CM

## 2013-05-22 DIAGNOSIS — E871 Hypo-osmolality and hyponatremia: Secondary | ICD-10-CM | POA: Diagnosis present

## 2013-05-22 DIAGNOSIS — R252 Cramp and spasm: Secondary | ICD-10-CM

## 2013-05-22 DIAGNOSIS — E785 Hyperlipidemia, unspecified: Secondary | ICD-10-CM

## 2013-05-22 DIAGNOSIS — D494 Neoplasm of unspecified behavior of bladder: Secondary | ICD-10-CM

## 2013-05-22 DIAGNOSIS — K632 Fistula of intestine: Secondary | ICD-10-CM

## 2013-05-22 LAB — CBC WITH DIFFERENTIAL/PLATELET
Basophils Absolute: 0 10*3/uL (ref 0.0–0.1)
Basophils Relative: 0 % (ref 0–1)
EOS PCT: 2 % (ref 0–5)
Eosinophils Absolute: 0.3 10*3/uL (ref 0.0–0.7)
HCT: 37.7 % (ref 36.0–46.0)
Hemoglobin: 11.7 g/dL — ABNORMAL LOW (ref 12.0–15.0)
LYMPHS ABS: 2 10*3/uL (ref 0.7–4.0)
LYMPHS PCT: 19 % (ref 12–46)
MCH: 25.3 pg — ABNORMAL LOW (ref 26.0–34.0)
MCHC: 31 g/dL (ref 30.0–36.0)
MCV: 81.4 fL (ref 78.0–100.0)
Monocytes Absolute: 0.9 10*3/uL (ref 0.1–1.0)
Monocytes Relative: 8 % (ref 3–12)
NEUTROS ABS: 7.3 10*3/uL (ref 1.7–7.7)
Neutrophils Relative %: 70 % (ref 43–77)
PLATELETS: 326 10*3/uL (ref 150–400)
RBC: 4.63 MIL/uL (ref 3.87–5.11)
RDW: 16.6 % — ABNORMAL HIGH (ref 11.5–15.5)
WBC: 10.5 10*3/uL (ref 4.0–10.5)

## 2013-05-22 LAB — COMPREHENSIVE METABOLIC PANEL
ALBUMIN: 2 g/dL — AB (ref 3.5–5.2)
ALK PHOS: 338 U/L — AB (ref 39–117)
ALT: 13 U/L (ref 0–35)
ALT: 15 U/L (ref 0–35)
AST: 19 U/L (ref 0–37)
AST: 20 U/L (ref 0–37)
Albumin: 2.2 g/dL — ABNORMAL LOW (ref 3.5–5.2)
Alkaline Phosphatase: 316 U/L — ABNORMAL HIGH (ref 39–117)
BILIRUBIN TOTAL: 0.4 mg/dL (ref 0.3–1.2)
BUN: 59 mg/dL — ABNORMAL HIGH (ref 6–23)
BUN: 56 mg/dL — AB (ref 6–23)
CALCIUM: 9.7 mg/dL (ref 8.4–10.5)
CHLORIDE: 89 meq/L — AB (ref 96–112)
CHLORIDE: 91 meq/L — AB (ref 96–112)
CO2: 27 meq/L (ref 19–32)
CO2: 31 mEq/L (ref 19–32)
CREATININE: 2.2 mg/dL — AB (ref 0.50–1.10)
Calcium: 9.4 mg/dL (ref 8.4–10.5)
Creatinine, Ser: 2.22 mg/dL — ABNORMAL HIGH (ref 0.50–1.10)
GFR calc Af Amer: 24 mL/min — ABNORMAL LOW (ref >90)
GFR calc non Af Amer: 21 mL/min — ABNORMAL LOW (ref >90)
GFR, EST AFRICAN AMERICAN: 24 mL/min — AB (ref >90)
GFR, EST NON AFRICAN AMERICAN: 20 mL/min — AB (ref >90)
Glucose, Bld: 208 mg/dL — ABNORMAL HIGH (ref 70–99)
Glucose, Bld: 187 mg/dL — ABNORMAL HIGH (ref 70–99)
POTASSIUM: 5 meq/L (ref 3.7–5.3)
Potassium: 5.2 mEq/L (ref 3.7–5.3)
SODIUM: 130 meq/L — AB (ref 137–147)
Sodium: 132 mEq/L — ABNORMAL LOW (ref 137–147)
Total Bilirubin: 0.4 mg/dL (ref 0.3–1.2)
Total Protein: 7.5 g/dL (ref 6.0–8.3)
Total Protein: 7.2 g/dL (ref 6.0–8.3)

## 2013-05-22 LAB — CBC
HCT: 35.4 % — ABNORMAL LOW (ref 36.0–46.0)
Hemoglobin: 11 g/dL — ABNORMAL LOW (ref 12.0–15.0)
MCH: 25.2 pg — ABNORMAL LOW (ref 26.0–34.0)
MCHC: 31.1 g/dL (ref 30.0–36.0)
MCV: 81.2 fL (ref 78.0–100.0)
PLATELETS: 313 10*3/uL (ref 150–400)
RBC: 4.36 MIL/uL (ref 3.87–5.11)
RDW: 16.4 % — ABNORMAL HIGH (ref 11.5–15.5)
WBC: 8.9 10*3/uL (ref 4.0–10.5)

## 2013-05-22 LAB — APTT: aPTT: 40 seconds — ABNORMAL HIGH (ref 24–37)

## 2013-05-22 LAB — PROTIME-INR
INR: 2.91 — ABNORMAL HIGH (ref 0.00–1.49)
INR: 3.07 — ABNORMAL HIGH (ref 0.00–1.49)
Prothrombin Time: 29.4 seconds — ABNORMAL HIGH (ref 11.6–15.2)
Prothrombin Time: 30.6 seconds — ABNORMAL HIGH (ref 11.6–15.2)

## 2013-05-22 LAB — TROPONIN I: Troponin I: <0.3 ng/mL (ref <0.30)

## 2013-05-22 LAB — TYPE AND SCREEN
ABO/RH(D): O POS
Antibody Screen: NEGATIVE

## 2013-05-22 LAB — URINE MICROSCOPIC-ADD ON

## 2013-05-22 LAB — URINALYSIS, ROUTINE W REFLEX MICROSCOPIC
Bilirubin Urine: NEGATIVE
GLUCOSE, UA: NEGATIVE mg/dL
Ketones, ur: NEGATIVE mg/dL
NITRITE: POSITIVE — AB
PH: 6.5 (ref 5.0–8.0)
Protein, ur: 100 mg/dL — AB
Specific Gravity, Urine: 1.017 (ref 1.005–1.030)
Urobilinogen, UA: 1 mg/dL (ref 0.0–1.0)

## 2013-05-22 LAB — GLUCOSE, CAPILLARY
GLUCOSE-CAPILLARY: 127 mg/dL — AB (ref 70–99)
GLUCOSE-CAPILLARY: 66 mg/dL — AB (ref 70–99)
GLUCOSE-CAPILLARY: 63 mg/dL — AB (ref 70–99)
Glucose-Capillary: 54 mg/dL — ABNORMAL LOW (ref 70–99)
Glucose-Capillary: 65 mg/dL — ABNORMAL LOW (ref 70–99)
Glucose-Capillary: 80 mg/dL (ref 70–99)

## 2013-05-22 LAB — POC OCCULT BLOOD, ED: Fecal Occult Bld: POSITIVE — AB

## 2013-05-22 LAB — MRSA PCR SCREENING: MRSA BY PCR: POSITIVE — AB

## 2013-05-22 LAB — PRO B NATRIURETIC PEPTIDE: PRO B NATRI PEPTIDE: 1436 pg/mL — AB (ref 0–450)

## 2013-05-22 MED ORDER — MORPHINE SULFATE 2 MG/ML IJ SOLN
INTRAMUSCULAR | Status: AC
  Filled 2013-05-22: qty 1

## 2013-05-22 MED ORDER — PIPERACILLIN-TAZOBACTAM 3.375 G IVPB 30 MIN
3.3750 g | Freq: Three times a day (TID) | INTRAVENOUS | Status: DC
  Filled 2013-05-22: qty 50

## 2013-05-22 MED ORDER — VANCOMYCIN HCL IN DEXTROSE 750-5 MG/150ML-% IV SOLN: 750.0000 mg | INTRAVENOUS | Status: DC

## 2013-05-22 MED ORDER — SENNOSIDES-DOCUSATE SODIUM 8.6-50 MG PO TABS
1.0000 | ORAL_TABLET | Freq: Two times a day (BID) | ORAL | Status: DC
  Administered 2013-05-22 – 2013-05-23 (×3): 1 via ORAL
  Filled 2013-05-22 (×3): qty 1

## 2013-05-22 MED ORDER — ACETAMINOPHEN 325 MG PO TABS: 650.0000 mg | ORAL_TABLET | Freq: Four times a day (QID) | ORAL | Status: DC | PRN

## 2013-05-22 MED ORDER — PIPERACILLIN-TAZOBACTAM 3.375 G IVPB 30 MIN
3.3750 g | Freq: Once | INTRAVENOUS | Status: AC
  Administered 2013-05-22: 3.375 g via INTRAVENOUS
  Filled 2013-05-22: qty 50

## 2013-05-22 MED ORDER — METOPROLOL SUCCINATE ER 50 MG PO TB24
50.0000 mg | ORAL_TABLET | Freq: Two times a day (BID) | ORAL | Status: DC
  Filled 2013-05-22 (×2): qty 1

## 2013-05-22 MED ORDER — ONDANSETRON HCL 4 MG/2ML IJ SOLN
4.0000 mg | Freq: Once | INTRAMUSCULAR | Status: AC
  Administered 2013-05-22: 4 mg via INTRAVENOUS
  Filled 2013-05-22: qty 2

## 2013-05-22 MED ORDER — SODIUM BICARBONATE 650 MG PO TABS
650.0000 mg | ORAL_TABLET | Freq: Two times a day (BID) | ORAL | Status: DC
  Administered 2013-05-22 – 2013-05-23 (×3): 650 mg via ORAL
  Filled 2013-05-22 (×4): qty 1

## 2013-05-22 MED ORDER — SODIUM CHLORIDE 0.9 % IV SOLN
INTRAVENOUS | Status: DC
Start: 2013-05-22 — End: 2013-05-22

## 2013-05-22 MED ORDER — FLEET ENEMA 7-19 GM/118ML RE ENEM: 1.0000 | ENEMA | RECTAL | Status: DC

## 2013-05-22 MED ORDER — LORAZEPAM 1 MG PO TABS: 1.0000 mg | ORAL_TABLET | Freq: Four times a day (QID) | ORAL | Status: DC | PRN

## 2013-05-22 MED ORDER — INSULIN ASPART 100 UNIT/ML ~~LOC~~ SOLN
0.0000 [IU] | Freq: Three times a day (TID) | SUBCUTANEOUS | Status: DC
Start: 2013-05-22 — End: 2013-05-22
  Administered 2013-05-22: 1 [IU] via SUBCUTANEOUS

## 2013-05-22 MED ORDER — FLEET ENEMA 7-19 GM/118ML RE ENEM
1.0000 | ENEMA | Freq: Once | RECTAL | Status: AC | PRN
  Administered 2013-05-22: 1 via RECTAL
  Filled 2013-05-22: qty 1

## 2013-05-22 MED ORDER — CHLORHEXIDINE GLUCONATE CLOTH 2 % EX PADS
6.0000 | MEDICATED_PAD | Freq: Every day | CUTANEOUS | Status: DC
  Administered 2013-05-22: 6 via TOPICAL

## 2013-05-22 MED ORDER — BISACODYL 10 MG RE SUPP: 10.0000 mg | Freq: Every day | RECTAL | Status: DC | PRN

## 2013-05-22 MED ORDER — ONDANSETRON HCL 4 MG/2ML IJ SOLN: 4.0000 mg | Freq: Four times a day (QID) | INTRAMUSCULAR | Status: DC | PRN

## 2013-05-22 MED ORDER — PANTOPRAZOLE SODIUM 40 MG PO TBEC
40.0000 mg | DELAYED_RELEASE_TABLET | Freq: Every day | ORAL | Status: DC
  Administered 2013-05-22: 40 mg via ORAL
  Filled 2013-05-22 (×2): qty 1

## 2013-05-22 MED ORDER — MORPHINE SULFATE 2 MG/ML IJ SOLN
2.0000 mg | Freq: Once | INTRAMUSCULAR | Status: AC
  Administered 2013-05-22: 2 mg via INTRAVENOUS

## 2013-05-22 MED ORDER — INSULIN GLARGINE 100 UNIT/ML ~~LOC~~ SOLN
30.0000 [IU] | Freq: Every day | SUBCUTANEOUS | Status: DC
  Filled 2013-05-22: qty 0.3

## 2013-05-22 MED ORDER — VANCOMYCIN HCL IN DEXTROSE 1-5 GM/200ML-% IV SOLN
1000.0000 mg | Freq: Once | INTRAVENOUS | Status: AC
  Administered 2013-05-22: 1000 mg via INTRAVENOUS
  Filled 2013-05-22: qty 200

## 2013-05-22 MED ORDER — PIPERACILLIN-TAZOBACTAM 3.375 G IVPB
3.3750 g | Freq: Three times a day (TID) | INTRAVENOUS | Status: DC
  Filled 2013-05-22: qty 50

## 2013-05-22 MED ORDER — LEVOTHYROXINE SODIUM 125 MCG PO TABS
125.0000 ug | ORAL_TABLET | Freq: Every day | ORAL | Status: DC
  Administered 2013-05-22 – 2013-05-23 (×2): 125 ug via ORAL
  Filled 2013-05-22 (×3): qty 1

## 2013-05-22 MED ORDER — HYDROCODONE-ACETAMINOPHEN 5-325 MG PO TABS: 1.0000 | ORAL_TABLET | ORAL | Status: DC | PRN

## 2013-05-22 MED ORDER — DOCUSATE SODIUM 100 MG PO CAPS
100.0000 mg | ORAL_CAPSULE | Freq: Two times a day (BID) | ORAL | Status: DC
  Administered 2013-05-22 – 2013-05-23 (×3): 100 mg via ORAL
  Filled 2013-05-22 (×4): qty 1

## 2013-05-22 MED ORDER — MORPHINE SULFATE 2 MG/ML IJ SOLN
2.0000 mg | INTRAMUSCULAR | Status: DC | PRN
  Administered 2013-05-22: 2 mg via INTRAVENOUS
  Filled 2013-05-22: qty 1

## 2013-05-22 MED ORDER — MORPHINE SULFATE (CONCENTRATE) 10 MG /0.5 ML PO SOLN
5.0000 mg | ORAL | Status: DC | PRN
  Administered 2013-05-22: 5 mg via ORAL
  Filled 2013-05-22: qty 0.5

## 2013-05-22 MED ORDER — INSULIN ASPART 100 UNIT/ML ~~LOC~~ SOLN
0.0000 [IU] | Freq: Every day | SUBCUTANEOUS | Status: DC
Start: 2013-05-22 — End: 2013-05-22

## 2013-05-22 MED ORDER — INSULIN GLARGINE 100 UNIT/ML ~~LOC~~ SOLN: 15.0000 [IU] | Freq: Every day | SUBCUTANEOUS | Status: DC

## 2013-05-22 MED ORDER — SODIUM CHLORIDE 0.9 % IV BOLUS (SEPSIS)
500.0000 mL | Freq: Once | INTRAVENOUS | Status: AC
  Administered 2013-05-22: 500 mL via INTRAVENOUS

## 2013-05-22 MED ORDER — BISACODYL 10 MG RE SUPP: 10.0000 mg | Freq: Once | RECTAL | Status: DC

## 2013-05-22 MED ORDER — ONDANSETRON HCL 4 MG PO TABS: 4.0000 mg | ORAL_TABLET | Freq: Four times a day (QID) | ORAL | Status: DC | PRN

## 2013-05-22 MED ORDER — SODIUM CHLORIDE 0.9 % IV SOLN
INTRAVENOUS | Status: AC
  Administered 2013-05-22: 04:00:00 via INTRAVENOUS

## 2013-05-22 MED ORDER — ACETAMINOPHEN 650 MG RE SUPP: 650.0000 mg | Freq: Four times a day (QID) | RECTAL | Status: DC | PRN

## 2013-05-22 MED ORDER — DEXTROSE-NACL 5-0.9 % IV SOLN
INTRAVENOUS | Status: DC
  Administered 2013-05-22: 17:00:00 via INTRAVENOUS

## 2013-05-22 MED ORDER — IPRATROPIUM-ALBUTEROL 0.5-2.5 (3) MG/3ML IN SOLN: 3.0000 mL | RESPIRATORY_TRACT | Status: DC | PRN

## 2013-05-22 MED ORDER — POLYETHYLENE GLYCOL 3350 17 G PO PACK
17.0000 g | PACK | Freq: Every day | ORAL | Status: DC | PRN
  Filled 2013-05-22: qty 1

## 2013-05-22 MED ORDER — MUPIROCIN 2 % EX OINT
TOPICAL_OINTMENT | Freq: Two times a day (BID) | CUTANEOUS | Status: DC
  Administered 2013-05-22: 23:00:00 via NASAL
  Administered 2013-05-22: 1 via NASAL
  Administered 2013-05-23: 10:00:00 via NASAL
  Filled 2013-05-22: qty 22

## 2013-05-22 MED ORDER — PIPERACILLIN-TAZOBACTAM IN DEX 2-0.25 GM/50ML IV SOLN
2.2500 g | Freq: Four times a day (QID) | INTRAVENOUS | Status: DC
  Administered 2013-05-22 – 2013-05-23 (×5): 2.25 g via INTRAVENOUS
  Filled 2013-05-22 (×8): qty 50

## 2013-05-22 NOTE — Consult Note (Signed)
Patient Heather [name]      DOB: 03/13/1940      OHY:[id number]     Consult Note from the Palliative Medicine Team at [facility] Requested by: Dr Tat     PCP: Rogene Houston, MD Reason for Consultation:C;larification of [place]     Phone [phone]  Assessment of patients Current state:  Continued physical, functional and cognitive decline  secondary to metastatic disease.  Patient and family faced with advanced directive decisions and anticipatory care needs   Consult is for review of medical treatment options, clarification of goals of care and end of life issues, disposition and options, and symptom recommendation.  This NP Wadie Lessen reviewed medical records, received report from team, assessed the patient and then meet at the patient's bedside along with her husband, son and daughter and several grand-children  to discuss diagnosis prognosis, GOC, EOL wishes disposition and options.  A detailed discussion was had today regarding advanced directives.  Concepts specific to code status, artifical feeding and hydration, continued IV antibiotics and rehospitalization was had.  The difference between a aggressive medical intervention path  and a palliative comfort care path for this patient at this time was had.  Values and goals of care important to patient and family were attempted to be elicited.  Concept of Hospice and Palliative Care were discussed  Natural trajectory and expectations at EOL were discussed.  Questions and concerns addressed.  Hard Choices booklet left for review. Family encouraged to call with questions or concerns.  PMT will continue to support holistically.   Goals of Care: 1.  Code Status:  DNR/DNI-comfort is main focus of care   2. Scope of Treatment: 1. Vital Signs: dialy 2. Respiratory/Oxygen: for comfort only 3. Nutritional Support/Tube Feeds:no artificial feeding now or in the future 4. Antibiotics: by mouth only 5. Blood  Products:none 6. RSW:NIOEVOJJ until dc then no further artifical hydration 7. Review of Medications to be discontinued:  Minimize for comfort  8. Labs: none 9. Telemetry:none    3. Disposition: Discharge home with hospice services   4. Symptom Management:   1. Anxiety/Agitation: Ativan 1 mg po every 4 hrs prn 2. Pain/Dyspnea: Roxanol 5 mg po every 2 hrs prn 3. Bowel Regimen:   -Fleets enema now                  -Senna-s two tablets qhs prn -Dulcolax supp prn  5. Psychosocial:  Emotional support offered to patient and family at bedsdie, all express love and support.  6. Spiritual:  Strong community church support     Patient Documents Completed or Given: Document Given Completed  Advanced Directives Pkt    MOST  yes  DNR    Gone from [facility]    Hard Choices      Brief HPI: Heather [name] is a 73 y.o. female  has a past medical history of Hypertension; Thyroid disease; Colon cancer; COPD (chronic obstructive pulmonary disease); MRSA infection; Pulmonary embolus; Deep vein thrombosis; Pneumonia; Rheumatic fever; Dehydration; MRSA (methicillin resistant staph aureus) culture positive; Arthritis; History of shingles (02/2010);  and Diabetes mellitus.   [name] was discharged from [facility] 1 week ago. Pateint reports poor PO intake and generalized fatigue.  After speaking extensively with patient herself as well as her family. Patient confirms at this point she would like to concentrate on comfort care only. Nonetheless she would like to have IV fluids administered as well as antibiotics and blood transfusion if  that would make her feel better. She does not wish any further studies otherwise is or any further interventions.   Hospitalist was called for admission for PNA, UTI for comfort care    ROS: weakness, poor appetite    PMH:  Past Medical History  Diagnosis Date  . Hypertension   . Thyroid disease   . Colon cancer   . COPD (chronic obstructive pulmonary  disease)   . MRSA infection   . Pulmonary embolus   . Deep vein thrombosis   . Pneumonia   . Rheumatic fever   . Dehydration   . MRSA (methicillin resistant staph aureus) culture positive   . Arthritis   . History of shingles 02/2010  . Colon cancer   . Diabetes mellitus      PSH: Past Surgical History  Procedure Laterality Date  . Back surgery    . Partial hysterectomy    . Cholecystectomy    . Colostomy takedown    . Arthroscopic repair acl    . Hernia repair    . Breast cyst excision    . Gallbladder surgery    . Back surgery    . Bile duct exploration    . Bladder tact      '70s  . Cystitis    . Port-a-cath removal    . Abcess drainage      abdominal wall  . Transurethral resection of bladder tumor N/A 03/27/2013    Procedure: TRANSURETHRAL RESECTION OF BLADDER TUMOR (TURBT);  Surgeon: Franchot Gallo, MD;  Location: [facility];  Service: Urology;  Laterality: N/A;  . Cystoscopy/retrograde/ureteroscopy N/A 03/27/2013    Procedure: CYSTOSCOPY RETROGRADE PYELOGRAM POSSIBLE URETEROSCOPY;  Surgeon: Franchot Gallo, MD;  Location: [facility];  Service: Urology;  Laterality: N/A;   I have reviewed the [place] and SH and  If appropriate update it with new information. Allergies  Allergen Reactions  . Motrin [Ibuprofen] Rash  . Sulfa Drugs Cross Reactors Rash  . Iohexol      Code: HIVES, Desc: pt states she has dye allergy, Onset Date: 00938182   . Quinine Derivatives Rash   Scheduled Meds: . bisacodyl  10 mg Rectal Once  . Chlorhexidine Gluconate Cloth  6 each Topical Q0600  . docusate sodium  100 mg Oral BID  . insulin aspart  0-5 Units Subcutaneous QHS  . insulin aspart  0-9 Units Subcutaneous TID WC  . levothyroxine  125 mcg Oral QAC breakfast  . morphine      . mupirocin ointment   Nasal BID  . pantoprazole  40 mg Oral Q1200  . piperacillin-tazobactam (ZOSYN)  IV  2.25 g Intravenous 4 times per day  . senna-docusate  1 tablet Oral BID  . sodium bicarbonate  650 mg Oral  BID  . [START ON 05/24/2013] vancomycin  750 mg Intravenous Q48H   Continuous Infusions: . sodium chloride 75 mL/hr at 05/22/13 0408   PRN Meds:.acetaminophen, acetaminophen, [START ON 05/23/2013] bisacodyl, HYDROcodone-acetaminophen, ipratropium-albuterol, morphine injection, ondansetron (ZOFRAN) IV, ondansetron, polyethylene glycol, [START ON 05/23/2013] sodium phosphate    BP 115/56  Pulse 89  Temp(Src) 98.4 F (36.9 C) (Oral)  Resp 18  Ht 5\' 1"  (1.549 m)  Wt 57.652 kg (127 lb 1.6 oz)  BMI 24.03 kg/m2  SpO2 99%   PPS:30 %   Intake/Output Summary (Last 24 hours) at 05/22/13 1306 Last data filed at 05/22/13 1016  Gross per 24 hour  Intake    440 ml  Output    300 ml  Net    140 ml   LBM: four days ago per patient    Physical Exam:  General: drail, weak HEENT:  Mm, no exudate noted Chest:   Decreased in bases CVS: RRR Abdomen: soft NT +BS Ext: without edema Neuro: alert and oriented  Labs: CBC    Component Value Date/Time   WBC 8.9 05/22/2013 0445   RBC 4.36 05/22/2013 0445   HGB 11.0* 05/22/2013 0445   HCT 35.4* 05/22/2013 0445   PLT 313 05/22/2013 0445   MCV 81.2 05/22/2013 0445   MCH 25.2* 05/22/2013 0445   MCHC 31.1 05/22/2013 0445   RDW 16.4* 05/22/2013 0445   LYMPHSABS 2.0 05/22/2013 0015   MONOABS 0.9 05/22/2013 0015   EOSABS 0.3 05/22/2013 0015   BASOSABS 0.0 05/22/2013 0015    BMET    Component Value Date/Time   NA 132* 05/22/2013 0445   K 5.2 05/22/2013 0445   CL 91* 05/22/2013 0445   CO2 31 05/22/2013 0445   GLUCOSE 187* 05/22/2013 0445   BUN 56* 05/22/2013 0445   CREATININE 2.20* 05/22/2013 0445   CREATININE 1.70* 04/10/2013 1110   CALCIUM 9.4 05/22/2013 0445   CALCIUM 9.5 07/28/2011 1540   GFRNONAA 21* 05/22/2013 0445   GFRAA 24* 05/22/2013 0445    CMP     Component Value Date/Time   NA 132* 05/22/2013 0445   K 5.2 05/22/2013 0445   CL 91* 05/22/2013 0445   CO2 31 05/22/2013 0445   GLUCOSE 187* 05/22/2013 0445   BUN 56* 05/22/2013 0445   CREATININE 2.20* 05/22/2013 0445   CREATININE  1.70* 04/10/2013 1110   CALCIUM 9.4 05/22/2013 0445   CALCIUM 9.5 07/28/2011 1540   PROT 7.2 05/22/2013 0445   ALBUMIN 2.0* 05/22/2013 0445   AST 20 05/22/2013 0445   ALT 13 05/22/2013 0445   ALKPHOS 316* 05/22/2013 0445   BILITOT 0.4 05/22/2013 0445   GFRNONAA 21* 05/22/2013 0445   GFRAA 24* 05/22/2013 0445      Time In Time Out Total Time Spent with Patient Total Overall Time  1315 1445 80 min 90 min    Greater than 50%  of this time was spent counseling and coordinating care related to the above assessment and plan.   Wadie Lessen NP  Palliative Medicine Team Team Phone # [phone] Pager [phone]  Discussed with Dr Tat

## 2013-05-22 NOTE — Telephone Encounter (Signed)
Hospice has received a referral from [facility] would like to know if Dr. Laural Golden would sign as the PCP? Her return phone number is [phone].

## 2013-05-22 NOTE — ED Provider Notes (Signed)
CSN: [id number]     Arrival date & time 05/21/13  2358 History   First MD Initiated Contact with Patient 05/22/13 0012     Chief Complaint  Patient presents with  . Emesis  . Weakness     (Consider location/radiation/quality/duration/timing/severity/associated sxs/prior Treatment) HPI Patient recently discharged from nursing facility on 05/15/13. She's had increased generalized weakness and fatigue to the point that she is no longer able to ambulate. She's had intermittent bouts of confusion and hallucinations. He had multiple episodes of vomiting this evening as noted she had coffee ground emesis. She had bowel movement today with hard stool but no gross blood patient has history of colon cancer has a nephrostomy tube in place. She is on Coumadin for atrial fibrillation. Her last INR was 2.3 earlier this month.   Past Medical History  Diagnosis Date  . Hypertension   . Thyroid disease   . Colon cancer   . COPD (chronic obstructive pulmonary disease)   . MRSA infection   . Pulmonary embolus   . Deep vein thrombosis   . Pneumonia   . Rheumatic fever   . Dehydration   . MRSA (methicillin resistant staph aureus) culture positive   . Arthritis   . History of shingles 02/2010  . Colon cancer   . Diabetes mellitus    Past Surgical History  Procedure Laterality Date  . Back surgery    . Partial hysterectomy    . Cholecystectomy    . Colostomy takedown    . Arthroscopic repair acl    . Hernia repair    . Breast cyst excision    . Gallbladder surgery    . Back surgery    . Bile duct exploration    . Bladder tact      '70s  . Cystitis    . Port-a-cath removal    . Abcess drainage      abdominal wall  . Transurethral resection of bladder tumor N/A 03/27/2013    Procedure: TRANSURETHRAL RESECTION OF BLADDER TUMOR (TURBT);  Surgeon: Franchot Gallo, MD;  [facility];  Service: Urology;  Laterality: N/A;  . Cystoscopy/retrograde/ureteroscopy N/A 03/27/2013    Procedure:  CYSTOSCOPY RETROGRADE PYELOGRAM POSSIBLE URETEROSCOPY;  Surgeon: Franchot Gallo, MD;  [facility];  Service: Urology;  Laterality: N/A;   Family History  Problem Relation Age of Onset  . Heart disease Sister   . Heart disease Brother   . Healthy Daughter   . Healthy Son   . Heart disease Mother   . Heart disease Father    History  Substance Use Topics  . Smoking status: Former Smoker -- 0.75 packs/day for 47 years    Types: Cigarettes    Quit date: 03/13/2013  . Smokeless tobacco: Never Used     Comment: Patient has not smoked in 1 month  . Alcohol Use: No   OB History   Grav Para Term Preterm Abortions TAB SAB Ect Mult Living                 Review of Systems  Constitutional: Negative for fever and chills.  Respiratory: Negative for cough and shortness of breath.   Cardiovascular: Negative for chest pain.  Gastrointestinal: Positive for nausea, vomiting and constipation. Negative for abdominal pain, diarrhea and blood in stool.  Musculoskeletal: Negative for back pain, myalgias, neck pain and neck stiffness.  Skin: Positive for pallor. Negative for rash and wound.  Neurological: Positive for weakness (generalized). Negative for dizziness, syncope, light-headedness, numbness and  headaches.  All other systems reviewed and are negative.     Allergies  Motrin; Sulfa drugs cross reactors; Iohexol; and Quinine derivatives  Home Medications   Prior to Admission medications   Medication Sig Start Date End Date Taking? Authorizing Provider  acetaminophen (TYLENOL) 325 MG tablet Take 2 tablets (650 mg total) by mouth every 6 (six) hours as needed for mild pain or fever (or Fever >/= 101). 04/03/13   Bonnielee Haff, MD  Amino Acids-Protein Hydrolys (FEEDING SUPPLEMENT, PRO-STAT SUGAR FREE 64,) LIQD Take 30 mLs by mouth 2 (two) times daily.    Historical Provider, MD  bisacodyl (DULCOLAX) 10 MG suppository Place 10 mg rectally daily as needed for moderate constipation.     Historical Provider, MD  Calcium Carbonate-Vit D-Min 600-200 MG-UNIT TABS Take 1 tablet by mouth daily.    Historical Provider, MD  Cholecalciferol (D3 ADULT PO) Take 1,000 Units by mouth daily.     Historical Provider, MD  cloNIDine (CATAPRES) 0.1 MG tablet Take 1 tablet (0.1 mg total) by mouth daily. 04/03/13   Bonnielee Haff, MD  docusate sodium 100 MG CAPS Take 100 mg by mouth 2 (two) times daily. 04/03/13   Bonnielee Haff, MD  enoxaparin (LOVENOX) 40 MG/0.4ML injection Inject 40 mg into the skin daily.    Historical Provider, MD  ezetimibe (ZETIA) 10 MG tablet Take 10 mg by mouth daily.    Historical Provider, MD  ferrous sulfate 325 (65 FE) MG tablet Take 325 mg by mouth daily with breakfast.    Historical Provider, MD  furosemide (LASIX) 40 MG tablet Take 1 tablet (40 mg total) by mouth daily. 05/18/13   Rogene Houston, MD  HYDROcodone-acetaminophen (NORCO/VICODIN) 5-325 MG per tablet Take 1-2 tablets by mouth every 4 (four) hours as needed for moderate pain. 04/03/13   Bonnielee Haff, MD  insulin glargine (LANTUS) 100 UNIT/ML injection Inject 30 Units into the skin at bedtime.    Historical Provider, MD  ipratropium-albuterol (DUONEB) 0.5-2.5 (3) MG/3ML SOLN Take 3 mLs by nebulization every 4 (four) hours as needed. 04/03/13   Bonnielee Haff, MD  levothyroxine (SYNTHROID, LEVOTHROID) 125 MCG tablet Take 1 tablet (125 mcg total) by mouth daily before breakfast. 05/18/13   Rogene Houston, MD  metoprolol succinate (TOPROL-XL) 50 MG 24 hr tablet Take 50 mg by mouth 2 (two) times daily. Take with or immediately following a meal.    Historical Provider, MD  Multiple Vitamin (MULTIVITAMIN WITH MINERALS) TABS tablet Take 1 tablet by mouth daily.    Historical Provider, MD  nystatin-triamcinolone (MYCOLOG II) cream Apply 1 application topically 2 (two) times daily. Apply to area of a skin rash twice daily until rash cleared 04/10/13   Rogene Houston, MD  Omega-3 Fatty Acids (FISH OIL) 1000 MG CAPS Take  1,000 mg by mouth daily.    Historical Provider, MD  ondansetron (ZOFRAN ODT) 4 MG disintegrating tablet Take 1 tablet (4 mg total) by mouth every 8 (eight) hours as needed for nausea or vomiting. 03/04/13   Carmin Muskrat, MD  polyethylene glycol (MIRALAX / Floria Raveling) packet Take 17 g by mouth 2 (two) times daily. 04/03/13   Bonnielee Haff, MD  potassium chloride SA (K-DUR,KLOR-CON) 20 MEQ tablet Take 1 tablet (20 mEq total) by mouth daily. 05/18/13   Rogene Houston, MD  senna-docusate (SENOKOT-S) 8.6-50 MG per tablet Take 1 tablet by mouth 2 (two) times daily.    Historical Provider, MD  sodium bicarbonate 650 MG tablet Take 1 tablet (650  mg total) by mouth 2 (two) times daily. 04/03/13   Bonnielee Haff, MD  warfarin (COUMADIN) 1 MG tablet Take 1 tablet (1 mg total) by mouth daily. 05/18/13   Rogene Houston, MD   There were no vitals taken for this visit. Physical Exam  Nursing note and vitals reviewed. Constitutional: She is oriented to person, place, and time. She appears well-developed and well-nourished. No distress.  HENT:  Head: Normocephalic and atraumatic.  Mouth/Throat: Oropharynx is clear and moist. No oropharyngeal exudate.  Eyes: EOM are normal. Pupils are equal, round, and reactive to light.  Neck: Normal range of motion. Neck supple.  Cardiovascular:  Irregularly irregular rhythm  Pulmonary/Chest: Effort normal. No respiratory distress. She has no wheezes. She has rales (few scattered rales). She exhibits no tenderness.  Abdominal: Soft. Bowel sounds are normal. She exhibits no distension and no mass. There is no tenderness. There is no rebound and no guarding.  Musculoskeletal: Normal range of motion. She exhibits no edema and no tenderness.  The thoracic or lumbar tenderness to palpation. Right-sided nephrostomy tube in place without evidence of infection. No calf swelling or tenderness.  Neurological: She is alert and oriented to person, place, and time.  4/5 motor in all  extremities. Sensation is grossly intact.  Skin: Skin is warm and dry. No rash noted. No erythema.  Psychiatric: She has a normal mood and affect. Her behavior is normal.    ED Course  Procedures (including critical care time) Labs Review Labs Reviewed  CBC WITH DIFFERENTIAL  COMPREHENSIVE METABOLIC PANEL  PROTIME-INR  TROPONIN I  URINALYSIS, ROUTINE W REFLEX MICROSCOPIC  APTT  POC OCCULT BLOOD, ED  TYPE AND SCREEN    Imaging Review No results found.   EKG Interpretation None      Date: 05/22/2013  Rate: 98  Rhythm: normal sinus rhythm  QRS Axis: normal  Intervals: normal  ST/T Wave abnormalities: nonspecific T wave changes  Conduction Disutrbances:none  Narrative Interpretation:   Old EKG Reviewed: unchanged Frequent PACs. T wave inversion in lateral and inferior leads appear unchanged from previous EKG. MDM   Final diagnoses:  None    No focal neurologic findings. Patient presents with generalized weakness. Questional blood in vomit. She has a history of multiple blood transfusions in the past. Patient remained stable in the emergency department. Questionable multifocal pneumonia. I discussed with the hospitalist and we'll admit.  Julianne Rice, MD 05/22/13 0530

## 2013-05-22 NOTE — ED Notes (Signed)
Pt presents with c/o vomiting and weakness. 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 Pt was discharged from a nursing facility on 05/15/13 and family reports that every since then pt has been weak, lethargic, and confused. Family reports that she has been seeing things that are not there. Family reports that today was pt's first bowel movement since she came home from the facility, stool was very hard. Pt has also been vomiting for the past hour, pt reports the vomit was dark brown, coffee ground emesis.

## 2013-05-22 NOTE — Care Management Note (Unsigned)
    Page 1 of 1   05/22/2013     3:49:12 PM CARE MANAGEMENT NOTE 05/22/2013  Patient:  Heather [name], Heather [name]   Account Number:  [id number]  Date Initiated:  05/22/2013  Documentation initiated by:  [facility]  Subjective/Objective Assessment:   73 year old female admitted with pneumonia and UTI. Has a history of urothelial carcinoma of the bladder and colon cancer.     Action/Plan:   Home with family and hospice services at d/c.   Anticipated DC Date:  05/25/2013   Anticipated DC Plan:  [place]  CM consult      Choice offered to / List presented to:             [place] [facility] [place]   Status of service:  In process, will continue to follow Medicare Important Message given?   (If response is "NO", the following Medicare IM given date fields will be blank) Date Medicare IM given:   Date Additional Medicare IM given:    Discharge Disposition:    Per UR Regulation:  Reviewed for med. necessity/level of care/duration of stay  If discussed at [place] of Stay Meetings, dates discussed:    Comments:  05/22/13 Allene Dillon RN BSN [phone] Met with pt, daughter and son at bedside to discuss home hospice choice. [facility]. They stated she will need a hospital bed but she already has a RW bedside commode. Family will provide transportation home. I called and spoke with Beth at [facility] . I faxd to her the H&P and demographics sheet.

## 2013-05-22 NOTE — Progress Notes (Signed)
ANTIBIOTIC CONSULT NOTE - INITIAL  Pharmacy Consult for Vancomycin and Zosyn  Indication: pneumonia  Allergies  Allergen Reactions  . Motrin [Ibuprofen] Rash  . Sulfa Drugs Cross Reactors Rash  . Iohexol      Code: HIVES, Desc: pt states she has dye allergy, Onset Date: 53299242   . Quinine Derivatives Rash    Patient Measurements: Height: 5\' 1"  (154.9 cm) Weight: 127 lb 1.6 oz (57.652 kg) IBW/kg (Calculated) : 47.8 Adjusted Body Weight:   Vital Signs: Temp: 98.4 F (36.9 C) (05/04 0412) Temp src: Oral (05/04 0412) BP: 99/69 mmHg (05/04 0412) Pulse Rate: 89 (05/04 0412) Intake/Output from previous day: 05/03 0701 - 05/04 0700 In: 250 [IV Piggyback:250] Out: -  Intake/Output from this shift: Total I/O In: 250 [IV Piggyback:250] Out: -   Labs:  Recent Labs  05/22/13 0015  WBC 10.5  HGB 11.7*  PLT 326  CREATININE 2.22*   Estimated Creatinine Clearance: 17.9 ml/min (by C-G formula based on Cr of 2.22). No results found for this basename: VANCOTROUGH, VANCOPEAK, VANCORANDOM, GENTTROUGH, GENTPEAK, GENTRANDOM, TOBRATROUGH, TOBRAPEAK, TOBRARND, AMIKACINPEAK, AMIKACINTROU, AMIKACIN,  in the last 72 hours   Microbiology: No results found for this or any previous visit (from the past 720 hour(s)).  Medical History: Past Medical History  Diagnosis Date  . Hypertension   . Thyroid disease   . Colon cancer   . COPD (chronic obstructive pulmonary disease)   . MRSA infection   . Pulmonary embolus   . Deep vein thrombosis   . Pneumonia   . Rheumatic fever   . Dehydration   . MRSA (methicillin resistant staph aureus) culture positive   . Arthritis   . History of shingles 02/2010  . Colon cancer   . Diabetes mellitus     Medications:  Anti-infectives   Start     Dose/Rate Route Frequency Ordered Stop   05/23/13 0800  vancomycin (VANCOCIN) IVPB 750 mg/150 ml premix     750 mg 150 mL/hr over 60 Minutes Intravenous Every 48 hours 05/22/13 0456     05/22/13 0600   piperacillin-tazobactam (ZOSYN) IVPB 3.375 g  Status:  Discontinued     3.375 g 100 mL/hr over 30 Minutes Intravenous 3 times per day 05/22/13 0358 05/22/13 0418   05/22/13 0600  piperacillin-tazobactam (ZOSYN) IVPB 3.375 g  Status:  Discontinued     3.375 g 12.5 mL/hr over 240 Minutes Intravenous 3 times per day 05/22/13 0419 05/22/13 0449   05/22/13 0600  piperacillin-tazobactam (ZOSYN) IVPB 2.25 g     2.25 g 100 mL/hr over 30 Minutes Intravenous 4 times per day 05/22/13 0450     05/22/13 0200  vancomycin (VANCOCIN) IVPB 1000 mg/200 mL premix     1,000 mg 200 mL/hr over 60 Minutes Intravenous  Once 05/22/13 0146 05/22/13 0404   05/22/13 0200  piperacillin-tazobactam (ZOSYN) IVPB 3.375 g     3.375 g 100 mL/hr over 30 Minutes Intravenous  Once 05/22/13 0146 05/22/13 0225     Assessment: Patient with PNA.  First dose of antibiotics already given.  Patient with very poor renal function, <60mL/min.  Goal of Therapy:  Vancomycin trough level 15-20 mcg/ml Zosyn based on renal function   Plan:  Measure antibiotic drug levels at steady state Follow up culture results Vancomycin 750mg  iv q48hr Zosyn 2.25gm iv q6hr  [employer]. 05/22/2013,4:56 AM

## 2013-05-22 NOTE — Progress Notes (Signed)
Hypoglycemic Event  CBG: 66  Treatment: 4oz OJ & orange sherbert  Symptoms: None  Follow-up CBG: Time: 1253 CBG Result: 80  Possible Reasons for Event: Lack of nutrition/food  Comments/MD notified:      Heather [name]  Remember to initiate Hypoglycemia Order Set & complete

## 2013-05-22 NOTE — H&P (Addendum)
PCP:  Rogene Houston, MD     Last admit 05/22/2013  Chief Complaint:  Constipation fatigue HPI: Heather [name] is a 73 y.o. female   has a past medical history of Hypertension; Thyroid disease; Colon cancer; COPD (chronic obstructive pulmonary disease); MRSA infection; Pulmonary embolus; Deep vein thrombosis; Pneumonia; Rheumatic fever; Dehydration; MRSA (methicillin resistant staph aureus) culture positive; Arthritis; History of shingles (02/2010); Colon cancer; and Diabetes mellitus.   [name] was discharged from [facility] 1 week ago. Pateint reports poor PO intake and generalized fatigue. She have not had a bowel movement for the past 6 days. Denies any fever, no chills. Patient was noted to be hypoxic by OT while at home this was 3 days ago. She started to have nausea around 11 pm and have had 2 episodes of dark vomitus with possible coffee grounds. This have resolved at this point. She reports no chest pain. She denies any black stools or melena.  Patient is on coumadin for hx of PE in 12 with INR of 2.91 After speaking extensively with patient herself as well as her family. Patient confirms at this point she would like to concentrate on comfort care only. Nonetheless she would like to have IV fluids administered as well as antibiotics and blood transfusion if that would make her feel better. She does not wish any further studies otherwise is or any further interventions.   Hospitalist was called for admission for PNA, UTI for comfort care  Review of Systems:    Pertinent positives include:  fatigue, nausea, vomiting, obstipation  Constitutional:  No weight loss, night sweats, Fevers, chills,weight loss  HEENT:  No headaches, Difficulty swallowing,Tooth/dental problems,Sore throat,  No sneezing, itching, ear ache, nasal congestion, post nasal drip,  Cardio-vascular:  No chest pain, Orthopnea, PND, anasarca, dizziness, palpitations.no Bilateral lower extremity swelling  GI:  No  heartburn, indigestion, abdominal pain, diarrhea, change in bowel habits, loss of appetite, melena, blood in stool, hematemesis Resp:  no shortness of breath at rest. No dyspnea on exertion, No excess mucus, no productive cough, No non-productive cough, No coughing up of blood.No change in color of mucus.No wheezing. Skin:  no rash or lesions. No jaundice GU:  no dysuria, change in color of urine, no urgency or frequency. No straining to urinate.  No flank pain.  Musculoskeletal:  No joint pain or no joint swelling. No decreased range of motion. No back pain.  Psych:  No change in mood or affect. No depression or anxiety. No memory loss.  Neuro: no localizing neurological complaints, no tingling, no weakness, no double vision, no gait abnormality, no slurred speech, no confusion  Otherwise ROS are negative except for above, 10 systems were reviewed  Past Medical History: Past Medical History  Diagnosis Date  . Hypertension   . Thyroid disease   . Colon cancer   . COPD (chronic obstructive pulmonary disease)   . MRSA infection   . Pulmonary embolus   . Deep vein thrombosis   . Pneumonia   . Rheumatic fever   . Dehydration   . MRSA (methicillin resistant staph aureus) culture positive   . Arthritis   . History of shingles 02/2010  . Colon cancer   . Diabetes mellitus    Past Surgical History  Procedure Laterality Date  . Back surgery    . Partial hysterectomy    . Cholecystectomy    . Colostomy takedown    . Arthroscopic repair acl    . Hernia repair    .  Breast cyst excision    . Gallbladder surgery    . Back surgery    . Bile duct exploration    . Bladder tact      '70s  . Cystitis    . Port-a-cath removal    . Abcess drainage      abdominal wall  . Transurethral resection of bladder tumor N/A 03/27/2013    Procedure: TRANSURETHRAL RESECTION OF BLADDER TUMOR (TURBT);  Surgeon: Franchot Gallo, MD;  Location: [facility];  Service: Urology;  Laterality: N/A;  .  Cystoscopy/retrograde/ureteroscopy N/A 03/27/2013    Procedure: CYSTOSCOPY RETROGRADE PYELOGRAM POSSIBLE URETEROSCOPY;  Surgeon: Franchot Gallo, MD;  Location: [facility];  Service: Urology;  Laterality: N/A;     Medications: Prior to Admission medications   Medication Sig Start Date End Date Taking? Authorizing Provider  acetaminophen (TYLENOL) 325 MG tablet Take 2 tablets (650 mg total) by mouth every 6 (six) hours as needed for mild pain or fever (or Fever >/= 101). 04/03/13  Yes Bonnielee Haff, MD  Calcium Carbonate-Vit D-Min 600-200 MG-UNIT TABS Take 1 tablet by mouth daily.   Yes Historical Provider, MD  Cholecalciferol (D3 ADULT PO) Take 1,000 Units by mouth daily.    Yes Historical Provider, MD  cloNIDine (CATAPRES) 0.1 MG tablet Take 1 tablet (0.1 mg total) by mouth daily. 04/03/13  Yes Bonnielee Haff, MD  ezetimibe (ZETIA) 10 MG tablet Take 10 mg by mouth daily.   Yes Historical Provider, MD  ferrous sulfate 325 (65 FE) MG tablet Take 325 mg by mouth daily with breakfast.   Yes Historical Provider, MD  furosemide (LASIX) 40 MG tablet Take 1 tablet (40 mg total) by mouth daily. 05/18/13  Yes Rogene Houston, MD  HYDROcodone-acetaminophen (NORCO/VICODIN) 5-325 MG per tablet Take 1-2 tablets by mouth every 4 (four) hours as needed for moderate pain. 04/03/13  Yes Bonnielee Haff, MD  insulin glargine (LANTUS) 100 UNIT/ML injection Inject 30 Units into the skin at bedtime.   Yes Historical Provider, MD  ipratropium-albuterol (DUONEB) 0.5-2.5 (3) MG/3ML SOLN Take 3 mLs by nebulization every 4 (four) hours as needed. 04/03/13  Yes Bonnielee Haff, MD  levothyroxine (SYNTHROID, LEVOTHROID) 125 MCG tablet Take 1 tablet (125 mcg total) by mouth daily before breakfast. 05/18/13  Yes Rogene Houston, MD  metoprolol succinate (TOPROL-XL) 50 MG 24 hr tablet Take 50 mg by mouth 2 (two) times daily. Take with or immediately following a meal.   Yes Historical Provider, MD  Multiple Vitamin (MULTIVITAMIN WITH  MINERALS) TABS tablet Take 1 tablet by mouth daily.   Yes Historical Provider, MD  nystatin-triamcinolone (MYCOLOG II) cream Apply 1 application topically 2 (two) times daily. Apply to area of a skin rash twice daily until rash cleared 04/10/13  Yes Rogene Houston, MD  Omega-3 Fatty Acids (FISH OIL) 1000 MG CAPS Take 1,000 mg by mouth daily.   Yes Historical Provider, MD  ondansetron (ZOFRAN ODT) 4 MG disintegrating tablet Take 1 tablet (4 mg total) by mouth every 8 (eight) hours as needed for nausea or vomiting. 03/04/13  Yes Carmin Muskrat, MD  potassium chloride SA (K-DUR,KLOR-CON) 20 MEQ tablet Take 1 tablet (20 mEq total) by mouth daily. 05/18/13  Yes Rogene Houston, MD  senna-docusate (SENOKOT-S) 8.6-50 MG per tablet Take 1 tablet by mouth 2 (two) times daily.   Yes Historical Provider, MD  sodium bicarbonate 650 MG tablet Take 1 tablet (650 mg total) by mouth 2 (two) times daily. 04/03/13  Yes Bonnielee Haff, MD  warfarin (COUMADIN) 1 MG tablet Take 1 tablet (1 mg total) by mouth daily. 05/18/13  Yes Rogene Houston, MD    Allergies:   Allergies  Allergen Reactions  . Motrin [Ibuprofen] Rash  . Sulfa Drugs Cross Reactors Rash  . Iohexol      Code: HIVES, Desc: pt states she has dye allergy, Onset Date: [id number]   . Quinine Derivatives Rash    Social History:   Lives at home With family   reports that she quit smoking about 2 months ago. Her smoking use included Cigarettes. She has a 35.25 pack-year smoking history. She has never used smokeless tobacco. She reports that she does not drink alcohol or use illicit drugs.    Family History: family history includes Healthy in her daughter and son; Heart disease in her brother, father, mother, and sister.    Physical Exam: No data found.   1. General:  in No Acute distress 2. Psychological: Alert and  Oriented 3. Head/ENT:    Dry Mucous Membranes                          Head Non traumatic, neck supple                           Normal  Dentition 4. SKIN:  decreased Skin turgor,  Skin clean Dry and intact no rash 5. Heart: Regular rate and rhythm no Murmur, Rub or gallop 6. Lungs:  no wheezes or crackles   7. Abdomen: Soft, non-tender, Non distended urostomy tubes present bilaterally 8. Lower extremities: no clubbing, cyanosis, trace edema 9. Neurologically Grossly intact, moving all 4 extremities equally 10. MSK: Normal range of motion  body mass index is unknown because there is no weight on file.   Labs on Admission:   Recent Labs  05/22/13 0015  NA 130*  K 5.0  CL 89*  CO2 27  GLUCOSE 208*  BUN 59*  CREATININE 2.22*  CALCIUM 9.7    Recent Labs  05/22/13 0015  AST 19  ALT 15  ALKPHOS 338*  BILITOT 0.4  PROT 7.5  ALBUMIN 2.2*   No results found for this basename: LIPASE, AMYLASE,  in the last 72 hours  Recent Labs  05/22/13 0015  WBC 10.5  NEUTROABS 7.3  HGB 11.7*  HCT 37.7  MCV 81.4  PLT 326    Recent Labs  05/22/13 0015  TROPONINI <0.30   No results found for this basename: TSH, T4TOTAL, FREET3, T3FREE, THYROIDAB,  in the last 72 hours No results found for this basename: VITAMINB12, FOLATE, FERRITIN, TIBC, IRON, RETICCTPCT,  in the last 72 hours Lab Results  Component Value Date   HGBA1C 9.1* 03/01/2013    The CrCl is unknown because both a height and weight (above a minimum accepted value) are required for this calculation. ABG    Component Value Date/Time   PHART 7.426* 10/24/2007 1125   HCO3 20.7 10/24/2007 1125   TCO2 25 03/22/2013 1800   ACIDBASEDEF 2.7* 10/24/2007 1125   O2SAT 97.5 10/24/2007 1125     No results found for this basename: DDIMER     Other results:  I have pearsonaly reviewed this: ECG REPORT  Rate: 98  Rhythm: Sinus tachycardia with possible bigeminy ST&T Change: Diffuse ST depressions   UA evidence of UTI  BNP (last 3 results)  Recent Labs  03/03/13 2334 05/22/13 0015  PROBNP 731.0* 1436.0*  There were no vitals filed for  this visit.   Cultures:    Component Value Date/Time   SDES URINE, CLEAN CATCH 03/22/2013 1702   SPECREQUEST NONE 03/22/2013 1702   CULT  Value: ESCHERICHIA COLI Performed at [facility] 03/22/2013 1702   REPTSTATUS 03/25/2013 FINAL 03/22/2013 1702         Radiological Exams on Admission: Ct Head Wo Contrast  05/22/2013   CLINICAL DATA:  Weakness.  History of bladder cancer.  EXAM: CT HEAD WITHOUT CONTRAST  TECHNIQUE: Contiguous axial images were obtained from the base of the skull through the vertex without intravenous contrast.  COMPARISON:  CT HEAD W/O CM dated 10/24/2007; CT BIOPSY dated 04/24/2013  FINDINGS: The ventricles and sulci are normal for age. No intraparenchymal hemorrhage, mass effect nor midline shift. Confluent supratentorial white matter hypodensities are within normal range for patient's age and though non-specific suggest sequelae of chronic small vessel ischemic disease. No acute large vascular territory infarcts. Tiny cystic right basal ganglia lacunar infarcts.  No abnormal extra-axial fluid collections. Basal cisterns are patent. Moderate calcific atherosclerosis of the carotid siphons.  No skull fracture. The included ocular globes and orbital contents are non-suspicious. Paranasal sinuses are well aerated. Trace left mastoid tip effusion. Osteopenia, with patchy sclerotic lesions in the clivus, which appear new.  IMPRESSION: No acute intracranial process.  Involutional changes. Moderate to severe white matter changes suggest chronic small vessel ischemic disease, with additional remote basal ganglia lacunar infarcts.  Osteopenia, with new sclerotic lesions in the clivus, though nonspecific can be seen with metastatic disease, recommend clinical correlation.   Electronically Signed   By: Elon Alas   On: 05/22/2013 01:38   Dg Chest Port 1 View  05/22/2013   CLINICAL DATA:  Week, lethargy  EXAM: PORTABLE CHEST - 1 VIEW  COMPARISON:  DG CHEST 1V PORT dated 03/31/2013; DG  CHEST 2 VIEW dated 03/03/2013  FINDINGS: There are bilateral mild chronic interstitial changes. There is superimposed bilateral patchy interstitial and alveolar airspace opacities. There is relatively increased patchy There is no pleural effusion or pneumothorax. The heart and mediastinal contours are unremarkable.  The osseous structures are unremarkable.  IMPRESSION: Bilateral patchy interstitial and alveolar airspace opacities superimposed upon mild chronic changes. The changes may reflect mild pulmonary edema versus multi lobar pneumonia.  Chronic interstitial lung disease.   Electronically Signed   By: Kathreen Devoid   On: 05/22/2013 01:00    Chart has been reviewed  Assessment/Plan  This is a 73 year old female with history of colon cancer, bladder cancer status post bilateral urostomy is here with likely pneumonia urinary tract infection and possibly upper GI bleeding as well as severe constipation patient this point wishes to [place] only other willing to receive IV fluids and antibiotics  Present on Admission:  Hcap - Chest x-ray was worrisome for healthcare associated pneumonia for now a trial broadly with Zosyn vancomycin but likely could to transition to by mouth medications for discharge to home  . Colon cancer patient has refused chemotherapy in the past does not wish to have any interventions at this point  . History of pulmonary embolus (PE) - family will discuss amongst themselves may no longer with the patient to be on Coumadin given her transition to comfort care. At this point will hold Coumadin given history of coffee ground emesis  . Diabetes mellitus for right now continue sliding scale  . Hypertension  - continue metoprolol and  . Anemia - at this point  there is no indication for transfusion we will repeat CBC in a.m. patient did state that should be interested in receiving blood products if necessary  . Acute-on-chronic kidney injury  - treat her gentle IV fluids  .  Obstructive uropathy - status post nephrostomy  . Bladder cancer - patient does not wish to have any further interventions  . Hypoxia - provide oxygen  . Hyponatremia - possibly secondary to mild dehydration give gentle fluids  . UTI (lower urinary tract infection) treated with broad-spectrum antibiotics given health care associated pneumonia Coffee-ground emesis will hold off warfarin - repeat CBC in a.m. nature patient is on Protonix she does not wish to have any studies done Constipation - bowel regimen and obtain KUB to evaluate for stool burden if needs disimpaction will hold off until INR is subtherapeutic to wait the Prophylaxis: SCD, Protonix  CODE STATUS:    DNR/DNI Of note patient is interested in palliative care consult to be called in a.m.  Other plan as per orders.  I have spent a total of 55 min on this admission  Heather [name] 05/22/2013, 2:35 AM

## 2013-05-22 NOTE — Progress Notes (Addendum)
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 Zannie Cove. She also had bilateral hydronephrosis secondary to tumor compression and underwent bilateral nephrostomy tube placement. On 04/24/2013, the patient underwent a left double-J stent placement on the left with exchange of left nephrostomy tube. There was unsuccessful attempt to place double-J stent in the right ureter. On 05/01/2013, there was unsuccessful second attempt for right regular rate and stent, but her right nephrostomy tube was exchanged. The patient was discharged from the hospital on 04/03/2013 to skilled nursing facility ([facility]) in [place]. She has been home for the past week. She presented to [facility] secondary to coffee grounds emesis as well as hypoxemia was noted by occupational therapy at home. The patient is on warfarin for PE and has a history of IVC filter. INR was 3.07 on 05/22/2013. Palliative medicine was consulted to assist with goals of care. Assessment/Plan: Acute Respiratory Failure -secondary to pneumonia -supplemental oxygan -pulmonary hygiene HCAP -Continue empiric vancomycin and Zosyn pending culture data -Pulmonary hygiene -IV fluids Acute on chronic CKD stage III -Secondary to hypovolemia, obstructive uropathy, and infectious process -Baseline creatinine 1.5-1.8 -IV fluids  Hematemesis -The patient remained hemodynamically stable -hold GI consult pending results of GOC with palliative medicine -Hemoglobin stable -Baseline hemoglobin  Pyuria -Continue Zosyn and vancomycin pending urine culture Urothelial Carcinoma of Bladder -Status post bilateral nephrostomy tubes secondary to obstructive uropathy  -Right nephrostomy tube exchange 05/01/2013  -Left nephrostomy tube exchange 04/24/2013  -CT of the brain showed  sclerosis of clivus concerning for possible metastatic disease 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 Had been maintained on chronic anticoagulation. Warfarin resumed 3/10. INR therapeutic. Heparin stopped. INR was reversed for procedures earlier this admission.  Hypertension and chronic diastolic dysfunction -Hold antihypertensives at this time as the patient's blood pressure is soft Diabetes mellitus type 2 -Discontinue Lantus as the patient had episode of mild hypoglycemia without any insulin this morning or last night -NovoLog sliding scale -check A1C Constipation -Start cathartics Hypothyroidism -Continue Synthroid  Family Communication:   Husband and son at beside Disposition Plan:   pending   Antibiotics:  zosyn 5/4>>>  vanco 5/4>>>        Procedures/Studies: Dg Abd 1 View  05/22/2013   CLINICAL DATA:  Abdomen and back pain.  Evaluate stool burden.  EXAM: ABDOMEN - 1 VIEW  COMPARISON:  None.  FINDINGS: Large stool burden is present in the right colon. Left ureteral stent is present. Extensive postsurgical changes in the abdomen. Right upper quadrant nephrostomy is present. Both nephrostomy tubes appear appropriately reconstituted. IVC filter is present. Moderate amount of stool is present within the rectum.  IMPRESSION: 1. Left ureteral stent and right nephrostomy tube. 2. Moderate stool burden, with prominent stool in the ascending colon and moderate amount of stool in the rectum. 3. Postsurgical in postprocedural changes in the abdomen.   Electronically Signed   By: Dereck Ligas M.D.   On: 05/22/2013 07:57   Ct Head Wo Contrast  05/22/2013   CLINICAL DATA:  Weakness.  History of bladder cancer.  EXAM: CT HEAD WITHOUT CONTRAST  TECHNIQUE: Contiguous axial images were obtained from the base of the skull through the vertex without intravenous contrast.  COMPARISON:  CT  HEAD W/O CM dated 10/24/2007; CT BIOPSY dated  04/24/2013  FINDINGS: The ventricles and sulci are normal for age. No intraparenchymal hemorrhage, mass effect nor midline shift. Confluent supratentorial white matter hypodensities are within normal range for patient's age and though non-specific suggest sequelae of chronic small vessel ischemic disease. No acute large vascular territory infarcts. Tiny cystic right basal ganglia lacunar infarcts.  No abnormal extra-axial fluid collections. Basal cisterns are patent. Moderate calcific atherosclerosis of the carotid siphons.  No skull fracture. The included ocular globes and orbital contents are non-suspicious. Paranasal sinuses are well aerated. Trace left mastoid tip effusion. Osteopenia, with patchy sclerotic lesions in the clivus, which appear new.  IMPRESSION: No acute intracranial process.  Involutional changes. Moderate to severe white matter changes suggest chronic small vessel ischemic disease, with additional remote basal ganglia lacunar infarcts.  Osteopenia, with new sclerotic lesions in the clivus, though nonspecific can be seen with metastatic disease, recommend clinical correlation.   Electronically Signed   By: Elon Alas   On: 05/22/2013 01:38   Ir Nephrostomy Tube Change  05/01/2013   CLINICAL DATA:  Bladder cancer, retroperitoneal mass, bilateral ureteral obstructions, recently placed left ureteral stent and bilateral nephrostomies  EXAM: BILATERAL ANTEGRADE NEPHROSTOGRAMS  LEFT NEPHROSTOMY REMOVAL  RIGHT NEPHROSTOMY EXCHANGE AFTER FAILED ATTEMPT AT RIGHT URETERAL INTERNALIZATION (second attempt)  Date:  4/13/20154/13/2015 11:32 AM  Radiologist:  Jerilynn Mages. Daryll Brod, MD  Guidance:  Fluoroscopic  FLUOROSCOPY TIME:  3 min 12 seconds  MEDICATIONS AND MEDICAL HISTORY: 1 g Rocephin administered within 1 hr of the procedure, 1 mg Versed, 50 mcg fentanyl  ANESTHESIA/SEDATION: 13 min  CONTRAST:  20 cc  COMPLICATIONS: No immediate  PROCEDURE: Informed consent was obtained from the patient following  explanation of the procedure, risks, benefits and alternatives. The patient understands, agrees and consents for the procedure. All questions were addressed. A time out was performed.  Maximal barrier sterile technique utilized including caps, mask, sterile gowns, sterile gloves, large sterile drape, hand hygiene, and Betadine.  Right: Initially, under sterile conditions and local anesthesia, the existing right nephrostomy catheter was cut and removed over a guidewire. A Kumpe catheter was advanced into the right collecting system. Several attempts were made with a Kumpe catheter and areas guidewires however access through the right UPJ obstruction could not be obtained. 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 This is not changed since the prior study. Therefore over guidewire, a right 10 [place] external nephrostomy was replaced. Retention loop formed in the renal pelvis. Contrast injection confirms position. Right nephrostomy re- secured with a Prolene suture and connected to external gravity bag. Sterile dressing applied. No immediate complication.  Left: Under sterile conditions, the existing left external nephrostomy catheter was injected with contrast for antegrade nephrostogram. This confirms patency of the left internal ureteral stent in good position. Therefore, the left nephrostomy catheter was cut and removed without difficulty over a guidewire. Patient tolerated the procedure well. No immediate complication.  IMPRESSION: Unsuccessful attempt at right ureteral internalization (second attempt).  Fluoroscopic replacement of the 10 French right external nephrostomy  Antegrade left nephrostogram confirms patency of the left ureteral stent. Therefore the left nephrostomy catheter was removed without difficulty.   Electronically Signed   By: Daryll Brod M.D.   On: 05/01/2013 13:07   Ir Nephrostomy Tube Change  04/24/2013   CLINICAL DATA:  History of bladder cancer, post bilateral  percutaneous nephrostomy catheter placement (03/24/2021. Please attempt internalization of the bilateral percutaneous nephrostomy catheters.  EXAM: 1.  BILATERAL NEPHROSTOGRAM. 2. LEFT SIDED URETERAL STENT PLACEMENT 3. BILATERAL PERCUTANEOUS NEPHROSTOMY CATHETER EXCHANGE  COMPARISON:  IR US GUIDE BX ASP/DRAIN dated 03/24/2013; CT ABD/PELV WO CM dated 03/09/2013  MEDICATIONS: Rocephin 1 g IV; The antibiotics were administered within 1 hour of the procedure.  ANESTHESIA/SEDATION: Versed 1 mg IV; Fentanyl 150 mcg IV  Total Moderate Sedation Time  45 minutes.  CONTRAST:  A total of 40 cc Omnipaque 300 was administered into both bilateral renal collecting systems  FLUOROSCOPY TIME:  9 minutes; 30 seconds.  COMPLICATIONS: None immediate  PROCEDURE: The procedure, risks, benefits, and alternatives were explained to the patient. Questions regarding the procedure were encouraged and answered. The patient understands and consents to the procedure.  The nephrostomy tubes and surrounding skin were prepped with Betadine in a sterile fashion, and a sterile drape was applied covering the operative field. A sterile gown and sterile gloves were used for the procedure. Local anesthesia was provided with 1% Lidocaine.  The preexisting left-sided nephrostomy tube was injected with contrast material. Fluoroscopic spot images were obtained of the collecting system and ureter to the level of the urinary bladder. The existing nephrostomy catheter was cut and cannulated with a Benson wire. Under intermittent fluoroscopic guidance, the existing nephrostomy catheter was exchanged for an 9-French vascular sheath which was advanced into the renal pelvis and utilized to advance an additional Benson wire into the renal collecting system to be used as a Chiropodist. The vascular sheath was exchanged for a Kumpe catheter which was manipulated to the level of the urinary bladder with the use of a regular Glidewire.  The Kumpe catheter was utilized to  estimate to appropriate length of the ureteral stent. An 8-French, 22 cm ureteral stent was then advanced over an Amplatz superstiff guidewire. The distal portion was formed at the level of the bladder. Proximal portion was then formed at the level of the renal collecting system.  At this point, the safety wire was utilized to replace an existing 10.2 [place] percutaneous nephrostomy catheter within the renal collecting system. The left-sided percutaneous nephrostomy catheter was capped and secured to the skin within interrupted suture. Postprocedural images were obtained in various obliquities.  Attention was now paid towards right-sided antegrade nephrostogram and potential right-sided double-J ureteral catheter placement. The existing right-sided nephrostomy catheter was injected with a small amount of contrast. Under intermittent fluoroscopic guidance, the existing nephrostomy catheter was exchanged for a 9 French vascular sheath. Contrast was injected via the existing sheath however failed to demonstrate opacification of the superior aspect of the right ureter. A Kumpe the catheter was manipulated into the caudal aspect of the right renal pelvis and a regular glidewire was utilized 2 probes the caudal aspect of the right renal pelvis however again, there is no opacification of the superior aspect of the right ureter. As such, under intermittent fluoroscopic guidance, the vascular sheath was exchanged for a new 10 French percutaneous nephrostomy catheter whose end was coiled and locked within the right renal pelvis. A small amount of contrast was injected via the existing nephrostomy catheter and a spot fluoroscopic image was obtained. The right-sided percutaneous nephrostomy catheter was connected to a gravity bag and secured at the skin within interrupted suture.  Both percutaneous nephrostomy catheters were capped and sutured in place. Dressings were placed. The patient tolerated procedure well without  immediate postprocedural complication.  FINDINGS: Nephrostogram demonstrates appropriate positioning and functioning of the existing left-sided percutaneous nephrostomy catheter. A very diminutive left ureter was ultimately opacified and  ultimately allowed placement of a 22 cm double-J ureteral stent with superior coil within the left renal pelvis and inferior coil within the urinary bladder. Given the marked narrowing of the superior aspect of the left ureter, the decision was made to replace the left-sided percutaneous nephrostomy catheter to maintain continued percutaneous access to the left kidney as the trial of internalization was performed with capped the left-sided percutaneous nephrostomy.  Initial right-sided nephrostogram demonstrates appropriate positioning and functioning of the existing right-sided percutaneous nephrostomy catheter, however there is persistent marked dilatation of the right renal pelvis and blunting of the renal calyces. Despite injection of a moderate to large volume of contrast into the right renal pelvis including selective injection from the caudal aspect of the left renal pelvis, the UPJ and superior aspect of the right ureter was never opacified. Prolonged efforts were made to cannulate the suspected location of the superior aspect of the right ureter however this ultimately proved unsuccessful. As such, a new left-sided percutaneous nephrostomy catheter was coiled and locked within left renal pelvis.  IMPRESSION: 1. Successful placement of left-sided 22 cm ureteral stent. 2. Successful exchange of left-sided 10 French nephrostomy catheter to maintain continued percutaneous access to the left kidney. This catheter will be capped for a trial of attempted internalization. 3. Unsuccessful attempted right-sided percutaneous ureteral stent placement secondary to non opacification of the superior aspect of the right ureter. 4. Successful fluoroscopic guided exchange of right-sided 10.2  French nephrostomy catheter.  PLAN: The patient will return early next week for left-sided antegrade nephrostogram and potential left-sided percutaneous nephrostomy catheter removal. Note, the patient was given an extra gravity bag and educated to connect the left-sided percutaneous nephrostomy catheter in the setting of the onset of left-sided flank pain, fever or chills.  During next week's left-sided antegrade nephrostogram, an additional attempt will be made at placing a right-sided double-J ureteral stent.   Electronically Signed   By: Sandi Mariscal M.D.   On: 04/24/2013 14:44   Ir Nephrostomy Tube Change  04/24/2013   CLINICAL DATA:  History of bladder cancer, post bilateral percutaneous nephrostomy catheter placement (03/24/2021. Please attempt internalization of the bilateral percutaneous nephrostomy catheters.  EXAM: 1. BILATERAL NEPHROSTOGRAM. 2. LEFT SIDED URETERAL STENT PLACEMENT 3. BILATERAL PERCUTANEOUS NEPHROSTOMY CATHETER EXCHANGE  COMPARISON:  IR US GUIDE BX ASP/DRAIN dated 03/24/2013; CT ABD/PELV WO CM dated 03/09/2013  MEDICATIONS: Rocephin 1 g IV; The antibiotics were administered within 1 hour of the procedure.  ANESTHESIA/SEDATION: Versed 1 mg IV; Fentanyl 150 mcg IV  Total Moderate Sedation Time  45 minutes.  CONTRAST:  A total of 40 cc Omnipaque 300 was administered into both bilateral renal collecting systems  FLUOROSCOPY TIME:  9 minutes; 30 seconds.  COMPLICATIONS: None immediate  PROCEDURE: The procedure, risks, benefits, and alternatives were explained to the patient. Questions regarding the procedure were encouraged and answered. The patient understands and consents to the procedure.  The nephrostomy tubes and surrounding skin were prepped with Betadine in a sterile fashion, and a sterile drape was applied covering the operative field. A sterile gown and sterile gloves were used for the procedure. Local anesthesia was provided with 1% Lidocaine.  The preexisting left-sided nephrostomy tube  was injected with contrast material. Fluoroscopic spot images were obtained of the collecting system and ureter to the level of the urinary bladder. The existing nephrostomy catheter was cut and cannulated with a Benson wire. Under intermittent fluoroscopic guidance, the existing nephrostomy catheter was exchanged for an 9-French vascular sheath which was  advanced into the renal pelvis and utilized to advance an additional Benson wire into the renal collecting system to be used as a Museum/gallery conservator. The vascular sheath was exchanged for a Kumpe catheter which was manipulated to the level of the urinary bladder with the use of a regular Glidewire.  The Kumpe catheter was utilized to estimate to appropriate length of the ureteral stent. An 8-French, 22 cm ureteral stent was then advanced over an Amplatz superstiff guidewire. The distal portion was formed at the level of the bladder. Proximal portion was then formed at the level of the renal collecting system.  At this point, the safety wire was utilized to replace an existing 10.2 [place] percutaneous nephrostomy catheter within the renal collecting system. The left-sided percutaneous nephrostomy catheter was capped and secured to the skin within interrupted suture. Postprocedural images were obtained in various obliquities.  Attention was now paid towards right-sided antegrade nephrostogram and potential right-sided double-J ureteral catheter placement. The existing right-sided nephrostomy catheter was injected with a small amount of contrast. Under intermittent fluoroscopic guidance, the existing nephrostomy catheter was exchanged for a 9 French vascular sheath. Contrast was injected via the existing sheath however failed to demonstrate opacification of the superior aspect of the right ureter. A Kumpe the catheter was manipulated into the caudal aspect of the right renal pelvis and a regular glidewire was utilized 2 probes the caudal aspect of the right renal pelvis  however again, there is no opacification of the superior aspect of the right ureter. As such, under intermittent fluoroscopic guidance, the vascular sheath was exchanged for a new 10 French percutaneous nephrostomy catheter whose end was coiled and locked within the right renal pelvis. A small amount of contrast was injected via the existing nephrostomy catheter and a spot fluoroscopic image was obtained. The right-sided percutaneous nephrostomy catheter was connected to a gravity bag and secured at the skin within interrupted suture.  Both percutaneous nephrostomy catheters were capped and sutured in place. Dressings were placed. The patient tolerated procedure well without immediate postprocedural complication.  FINDINGS: Nephrostogram demonstrates appropriate positioning and functioning of the existing left-sided percutaneous nephrostomy catheter. A very diminutive left ureter was ultimately opacified and ultimately allowed placement of a 22 cm double-J ureteral stent with superior coil within the left renal pelvis and inferior coil within the urinary bladder. Given the marked narrowing of the superior aspect of the left ureter, the decision was made to replace the left-sided percutaneous nephrostomy catheter to maintain continued percutaneous access to the left kidney as the trial of internalization was performed with capped the left-sided percutaneous nephrostomy.  Initial right-sided nephrostogram demonstrates appropriate positioning and functioning of the existing right-sided percutaneous nephrostomy catheter, however there is persistent marked dilatation of the right renal pelvis and blunting of the renal calyces. Despite injection of a moderate to large volume of contrast into the right renal pelvis including selective injection from the caudal aspect of the left renal pelvis, the UPJ and superior aspect of the right ureter was never opacified. Prolonged efforts were made to cannulate the suspected location  of the superior aspect of the right ureter however this ultimately proved unsuccessful. As such, a new left-sided percutaneous nephrostomy catheter was coiled and locked within left renal pelvis.  IMPRESSION: 1. Successful placement of left-sided 22 cm ureteral stent. 2. Successful exchange of left-sided 10 French nephrostomy catheter to maintain continued percutaneous access to the left kidney. This catheter will be capped for a trial of attempted internalization. 3. Unsuccessful attempted right-sided  percutaneous ureteral stent placement secondary to non opacification of the superior aspect of the right ureter. 4. Successful fluoroscopic guided exchange of right-sided 10.2 French nephrostomy catheter.  PLAN: The patient will return early next week for left-sided antegrade nephrostogram and potential left-sided percutaneous nephrostomy catheter removal. Note, the patient was given an extra gravity bag and educated to connect the left-sided percutaneous nephrostomy catheter in the setting of the onset of left-sided flank pain, fever or chills.  During next week's left-sided antegrade nephrostogram, an additional attempt will be made at placing a right-sided double-J ureteral stent.   Electronically Signed   By: Sandi Mariscal M.D.   On: 04/24/2013 14:44   Ir Nephrostogram Left  05/01/2013   CLINICAL DATA:  Bladder cancer, retroperitoneal mass, bilateral ureteral obstructions, recently placed left ureteral stent and bilateral nephrostomies  EXAM: BILATERAL ANTEGRADE NEPHROSTOGRAMS  LEFT NEPHROSTOMY REMOVAL  RIGHT NEPHROSTOMY EXCHANGE AFTER FAILED ATTEMPT AT RIGHT URETERAL INTERNALIZATION (second attempt)  Date:  4/13/20154/13/2015 11:32 AM  Radiologist:  Jerilynn Mages. Daryll Brod, MD  Guidance:  Fluoroscopic  FLUOROSCOPY TIME:  3 min 12 seconds  MEDICATIONS AND MEDICAL HISTORY: 1 g Rocephin administered within 1 hr of the procedure, 1 mg Versed, 50 mcg fentanyl  ANESTHESIA/SEDATION: 13 min  CONTRAST:  20 cc  COMPLICATIONS: No  immediate  PROCEDURE: Informed consent was obtained from the patient following explanation of the procedure, risks, benefits and alternatives. The patient understands, agrees and consents for the procedure. All questions were addressed. A time out was performed.  Maximal barrier sterile technique utilized including caps, mask, sterile gowns, sterile gloves, large sterile drape, hand hygiene, and Betadine.  Right: Initially, under sterile conditions and local anesthesia, the existing right nephrostomy catheter was cut and removed over a guidewire. A Kumpe catheter was advanced into the right collecting system. Several attempts were made with a Kumpe catheter and areas guidewires however access through the right UPJ obstruction could not be obtained. 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 This is not changed since the prior study. Therefore over guidewire, a right 10 [place] external nephrostomy was replaced. Retention loop formed in the renal pelvis. Contrast injection confirms position. Right nephrostomy re- secured with a Prolene suture and connected to external gravity bag. Sterile dressing applied. No immediate complication.  Left: Under sterile conditions, the existing left external nephrostomy catheter was injected with contrast for antegrade nephrostogram. This confirms patency of the left internal ureteral stent in good position. Therefore, the left nephrostomy catheter was cut and removed without difficulty over a guidewire. Patient tolerated the procedure well. No immediate complication.  IMPRESSION: Unsuccessful attempt at right ureteral internalization (second attempt).  Fluoroscopic replacement of the 10 French right external nephrostomy  Antegrade left nephrostogram confirms patency of the left ureteral stent. Therefore the left nephrostomy catheter was removed without difficulty.   Electronically Signed   By: Daryll Brod M.D.   On: 05/01/2013 13:07   Ir Nephrostogram  Left  04/24/2013   CLINICAL DATA:  History of bladder cancer, post bilateral percutaneous nephrostomy catheter placement (03/24/2021. Please attempt internalization of the bilateral percutaneous nephrostomy catheters.  EXAM: 1. BILATERAL NEPHROSTOGRAM. 2. LEFT SIDED URETERAL STENT PLACEMENT 3. BILATERAL PERCUTANEOUS NEPHROSTOMY CATHETER EXCHANGE  COMPARISON:  IR US GUIDE BX ASP/DRAIN dated 03/24/2013; CT ABD/PELV WO CM dated 03/09/2013  MEDICATIONS: Rocephin 1 g IV; The antibiotics were administered within 1 hour of the procedure.  ANESTHESIA/SEDATION: Versed 1 mg IV; Fentanyl 150 mcg IV  Total Moderate Sedation Time  45 minutes.  CONTRAST:  A total of 40 cc Omnipaque 300 was administered into both bilateral renal collecting systems  FLUOROSCOPY TIME:  9 minutes; 30 seconds.  COMPLICATIONS: None immediate  PROCEDURE: The procedure, risks, benefits, and alternatives were explained to the patient. Questions regarding the procedure were encouraged and answered. The patient understands and consents to the procedure.  The nephrostomy tubes and surrounding skin were prepped with Betadine in a sterile fashion, and a sterile drape was applied covering the operative field. A sterile gown and sterile gloves were used for the procedure. Local anesthesia was provided with 1% Lidocaine.  The preexisting left-sided nephrostomy tube was injected with contrast material. Fluoroscopic spot images were obtained of the collecting system and ureter to the level of the urinary bladder. The existing nephrostomy catheter was cut and cannulated with a Benson wire. Under intermittent fluoroscopic guidance, the existing nephrostomy catheter was exchanged for an 9-French vascular sheath which was advanced into the renal pelvis and utilized to advance an additional Benson wire into the renal collecting system to be used as a Museum/gallery conservator. The vascular sheath was exchanged for a Kumpe catheter which was manipulated to the level of the urinary  bladder with the use of a regular Glidewire.  The Kumpe catheter was utilized to estimate to appropriate length of the ureteral stent. An 8-French, 22 cm ureteral stent was then advanced over an Amplatz superstiff guidewire. The distal portion was formed at the level of the bladder. Proximal portion was then formed at the level of the renal collecting system.  At this point, the safety wire was utilized to replace an existing 10.2 [place] percutaneous nephrostomy catheter within the renal collecting system. The left-sided percutaneous nephrostomy catheter was capped and secured to the skin within interrupted suture. Postprocedural images were obtained in various obliquities.  Attention was now paid towards right-sided antegrade nephrostogram and potential right-sided double-J ureteral catheter placement. The existing right-sided nephrostomy catheter was injected with a small amount of contrast. Under intermittent fluoroscopic guidance, the existing nephrostomy catheter was exchanged for a 9 French vascular sheath. Contrast was injected via the existing sheath however failed to demonstrate opacification of the superior aspect of the right ureter. A Kumpe the catheter was manipulated into the caudal aspect of the right renal pelvis and a regular glidewire was utilized 2 probes the caudal aspect of the right renal pelvis however again, there is no opacification of the superior aspect of the right ureter. As such, under intermittent fluoroscopic guidance, the vascular sheath was exchanged for a new 10 French percutaneous nephrostomy catheter whose end was coiled and locked within the right renal pelvis. A small amount of contrast was injected via the existing nephrostomy catheter and a spot fluoroscopic image was obtained. The right-sided percutaneous nephrostomy catheter was connected to a gravity bag and secured at the skin within interrupted suture.  Both percutaneous nephrostomy catheters were capped and sutured in  place. Dressings were placed. The patient tolerated procedure well without immediate postprocedural complication.  FINDINGS: Nephrostogram demonstrates appropriate positioning and functioning of the existing left-sided percutaneous nephrostomy catheter. A very diminutive left ureter was ultimately opacified and ultimately allowed placement of a 22 cm double-J ureteral stent with superior coil within the left renal pelvis and inferior coil within the urinary bladder. Given the marked narrowing of the superior aspect of the left ureter, the decision was made to replace the left-sided percutaneous nephrostomy catheter to maintain continued percutaneous access to the left kidney as the trial of internalization was performed with capped the  left-sided percutaneous nephrostomy.  Initial right-sided nephrostogram demonstrates appropriate positioning and functioning of the existing right-sided percutaneous nephrostomy catheter, however there is persistent marked dilatation of the right renal pelvis and blunting of the renal calyces. Despite injection of a moderate to large volume of contrast into the right renal pelvis including selective injection from the caudal aspect of the left renal pelvis, the UPJ and superior aspect of the right ureter was never opacified. Prolonged efforts were made to cannulate the suspected location of the superior aspect of the right ureter however this ultimately proved unsuccessful. As such, a new left-sided percutaneous nephrostomy catheter was coiled and locked within left renal pelvis.  IMPRESSION: 1. Successful placement of left-sided 22 cm ureteral stent. 2. Successful exchange of left-sided 10 French nephrostomy catheter to maintain continued percutaneous access to the left kidney. This catheter will be capped for a trial of attempted internalization. 3. Unsuccessful attempted right-sided percutaneous ureteral stent placement secondary to non opacification of the superior aspect of the  right ureter. 4. Successful fluoroscopic guided exchange of right-sided 10.2 French nephrostomy catheter.  PLAN: The patient will return early next week for left-sided antegrade nephrostogram and potential left-sided percutaneous nephrostomy catheter removal. Note, the patient was given an extra gravity bag and educated to connect the left-sided percutaneous nephrostomy catheter in the setting of the onset of left-sided flank pain, fever or chills.  During next week's left-sided antegrade nephrostogram, an additional attempt will be made at placing a right-sided double-J ureteral stent.   Electronically Signed   By: Sandi Mariscal M.D.   On: 04/24/2013 14:44   Ir Nephrostogram Right  05/01/2013   CLINICAL DATA:  Bladder cancer, retroperitoneal mass, bilateral ureteral obstructions, recently placed left ureteral stent and bilateral nephrostomies  EXAM: BILATERAL ANTEGRADE NEPHROSTOGRAMS  LEFT NEPHROSTOMY REMOVAL  RIGHT NEPHROSTOMY EXCHANGE AFTER FAILED ATTEMPT AT RIGHT URETERAL INTERNALIZATION (second attempt)  Date:  4/13/20154/13/2015 11:32 AM  Radiologist:  Jerilynn Mages. Daryll Brod, MD  Guidance:  Fluoroscopic  FLUOROSCOPY TIME:  3 min 12 seconds  MEDICATIONS AND MEDICAL HISTORY: 1 g Rocephin administered within 1 hr of the procedure, 1 mg Versed, 50 mcg fentanyl  ANESTHESIA/SEDATION: 13 min  CONTRAST:  20 cc  COMPLICATIONS: No immediate  PROCEDURE: Informed consent was obtained from the patient following explanation of the procedure, risks, benefits and alternatives. The patient understands, agrees and consents for the procedure. All questions were addressed. A time out was performed.  Maximal barrier sterile technique utilized including caps, mask, sterile gowns, sterile gloves, large sterile drape, hand hygiene, and Betadine.  Right: Initially, under sterile conditions and local anesthesia, the existing right nephrostomy catheter was cut and removed over a guidewire. A Kumpe catheter was advanced into the right  collecting system. Several attempts were made with a Kumpe catheter and areas guidewires however access through the right UPJ obstruction could not be obtained. 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 This is not changed since the prior study. Therefore over guidewire, a right 10 [place] external nephrostomy was replaced. Retention loop formed in the renal pelvis. Contrast injection confirms position. Right nephrostomy re- secured with a Prolene suture and connected to external gravity bag. Sterile dressing applied. No immediate complication.  Left: Under sterile conditions, the existing left external nephrostomy catheter was injected with contrast for antegrade nephrostogram. This confirms patency of the left internal ureteral stent in good position. Therefore, the left nephrostomy catheter was cut and removed without difficulty over a guidewire. Patient tolerated the procedure well. No immediate complication.  IMPRESSION: Unsuccessful attempt at right ureteral internalization (second attempt).  Fluoroscopic replacement of the 10 French right external nephrostomy  Antegrade left nephrostogram confirms patency of the left ureteral stent. Therefore the left nephrostomy catheter was removed without difficulty.   Electronically Signed   By: Daryll Brod M.D.   On: 05/01/2013 13:07   Ir Nephrostogram Right  04/24/2013   CLINICAL DATA:  History of bladder cancer, post bilateral percutaneous nephrostomy catheter placement (03/24/2021. Please attempt internalization of the bilateral percutaneous nephrostomy catheters.  EXAM: 1. BILATERAL NEPHROSTOGRAM. 2. LEFT SIDED URETERAL STENT PLACEMENT 3. BILATERAL PERCUTANEOUS NEPHROSTOMY CATHETER EXCHANGE  COMPARISON:  IR US GUIDE BX ASP/DRAIN dated 03/24/2013; CT ABD/PELV WO CM dated 03/09/2013  MEDICATIONS: Rocephin 1 g IV; The antibiotics were administered within 1 hour of the procedure.  ANESTHESIA/SEDATION: Versed 1 mg IV; Fentanyl 150 mcg IV  Total  Moderate Sedation Time  45 minutes.  CONTRAST:  A total of 40 cc Omnipaque 300 was administered into both bilateral renal collecting systems  FLUOROSCOPY TIME:  9 minutes; 30 seconds.  COMPLICATIONS: None immediate  PROCEDURE: The procedure, risks, benefits, and alternatives were explained to the patient. Questions regarding the procedure were encouraged and answered. The patient understands and consents to the procedure.  The nephrostomy tubes and surrounding skin were prepped with Betadine in a sterile fashion, and a sterile drape was applied covering the operative field. A sterile gown and sterile gloves were used for the procedure. Local anesthesia was provided with 1% Lidocaine.  The preexisting left-sided nephrostomy tube was injected with contrast material. Fluoroscopic spot images were obtained of the collecting system and ureter to the level of the urinary bladder. The existing nephrostomy catheter was cut and cannulated with a Benson wire. Under intermittent fluoroscopic guidance, the existing nephrostomy catheter was exchanged for an 9-French vascular sheath which was advanced into the renal pelvis and utilized to advance an additional Benson wire into the renal collecting system to be used as a Chiropodist. The vascular sheath was exchanged for a Kumpe catheter which was manipulated to the level of the urinary bladder with the use of a regular Glidewire.  The Kumpe catheter was utilized to estimate to appropriate length of the ureteral stent. An 8-French, 22 cm ureteral stent was then advanced over an Amplatz superstiff guidewire. The distal portion was formed at the level of the bladder. Proximal portion was then formed at the level of the renal collecting system.  At this point, the safety wire was utilized to replace an existing 10.2 [place] percutaneous nephrostomy catheter within the renal collecting system. The left-sided percutaneous nephrostomy catheter was capped and secured to the skin within  interrupted suture. Postprocedural images were obtained in various obliquities.  Attention was now paid towards right-sided antegrade nephrostogram and potential right-sided double-J ureteral catheter placement. The existing right-sided nephrostomy catheter was injected with a small amount of contrast. Under intermittent fluoroscopic guidance, the existing nephrostomy catheter was exchanged for a 9 French vascular sheath. Contrast was injected via the existing sheath however failed to demonstrate opacification of the superior aspect of the right ureter. A Kumpe the catheter was manipulated into the caudal aspect of the right renal pelvis and a regular glidewire was utilized 2 probes the caudal aspect of the right renal pelvis however again, there is no opacification of the superior aspect of the right ureter. As such, under intermittent fluoroscopic guidance, the vascular sheath was exchanged for a new 10 French percutaneous nephrostomy catheter whose end was coiled and locked  within the right renal pelvis. A small amount of contrast was injected via the existing nephrostomy catheter and a spot fluoroscopic image was obtained. The right-sided percutaneous nephrostomy catheter was connected to a gravity bag and secured at the skin within interrupted suture.  Both percutaneous nephrostomy catheters were capped and sutured in place. Dressings were placed. The patient tolerated procedure well without immediate postprocedural complication.  FINDINGS: Nephrostogram demonstrates appropriate positioning and functioning of the existing left-sided percutaneous nephrostomy catheter. A very diminutive left ureter was ultimately opacified and ultimately allowed placement of a 22 cm double-J ureteral stent with superior coil within the left renal pelvis and inferior coil within the urinary bladder. Given the marked narrowing of the superior aspect of the left ureter, the decision was made to replace the left-sided percutaneous  nephrostomy catheter to maintain continued percutaneous access to the left kidney as the trial of internalization was performed with capped the left-sided percutaneous nephrostomy.  Initial right-sided nephrostogram demonstrates appropriate positioning and functioning of the existing right-sided percutaneous nephrostomy catheter, however there is persistent marked dilatation of the right renal pelvis and blunting of the renal calyces. Despite injection of a moderate to large volume of contrast into the right renal pelvis including selective injection from the caudal aspect of the left renal pelvis, the UPJ and superior aspect of the right ureter was never opacified. Prolonged efforts were made to cannulate the suspected location of the superior aspect of the right ureter however this ultimately proved unsuccessful. As such, a new left-sided percutaneous nephrostomy catheter was coiled and locked within left renal pelvis.  IMPRESSION: 1. Successful placement of left-sided 22 cm ureteral stent. 2. Successful exchange of left-sided 10 French nephrostomy catheter to maintain continued percutaneous access to the left kidney. This catheter will be capped for a trial of attempted internalization. 3. Unsuccessful attempted right-sided percutaneous ureteral stent placement secondary to non opacification of the superior aspect of the right ureter. 4. Successful fluoroscopic guided exchange of right-sided 10.2 French nephrostomy catheter.  PLAN: The patient will return early next week for left-sided antegrade nephrostogram and potential left-sided percutaneous nephrostomy catheter removal. Note, the patient was given an extra gravity bag and educated to connect the left-sided percutaneous nephrostomy catheter in the setting of the onset of left-sided flank pain, fever or chills.  During next week's left-sided antegrade nephrostogram, an additional attempt will be made at placing a right-sided double-J ureteral stent.    Electronically Signed   By: Sandi Mariscal M.D.   On: 04/24/2013 14:44   Ir Nephro Tube Remov/fl  05/01/2013   CLINICAL DATA:  Bladder cancer, retroperitoneal mass, bilateral ureteral obstructions, recently placed left ureteral stent and bilateral nephrostomies  EXAM: BILATERAL ANTEGRADE NEPHROSTOGRAMS  LEFT NEPHROSTOMY REMOVAL  RIGHT NEPHROSTOMY EXCHANGE AFTER FAILED ATTEMPT AT RIGHT URETERAL INTERNALIZATION (second attempt)  Date:  4/13/20154/13/2015 11:32 AM  Radiologist:  Jerilynn Mages. Daryll Brod, MD  Guidance:  Fluoroscopic  FLUOROSCOPY TIME:  3 min 12 seconds  MEDICATIONS AND MEDICAL HISTORY: 1 g Rocephin administered within 1 hr of the procedure, 1 mg Versed, 50 mcg fentanyl  ANESTHESIA/SEDATION: 13 min  CONTRAST:  20 cc  COMPLICATIONS: No immediate  PROCEDURE: Informed consent was obtained from the patient following explanation of the procedure, risks, benefits and alternatives. The patient understands, agrees and consents for the procedure. All questions were addressed. A time out was performed.  Maximal barrier sterile technique utilized including caps, mask, sterile gowns, sterile gloves, large sterile drape, hand hygiene, and Betadine.  Right: Initially, under sterile  conditions and local anesthesia, the existing right nephrostomy catheter was cut and removed over a guidewire. A Kumpe catheter was advanced into the right collecting system. Several attempts were made with a Kumpe catheter and areas guidewires however access through the right UPJ obstruction could not be obtained. 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 This is not changed since the prior study. Therefore over guidewire, a right 10 [place] external nephrostomy was replaced. Retention loop formed in the renal pelvis. Contrast injection confirms position. Right nephrostomy re- secured with a Prolene suture and connected to external gravity bag. Sterile dressing applied. No immediate complication.  Left: Under sterile  conditions, the existing left external nephrostomy catheter was injected with contrast for antegrade nephrostogram. This confirms patency of the left internal ureteral stent in good position. Therefore, the left nephrostomy catheter was cut and removed without difficulty over a guidewire. Patient tolerated the procedure well. No immediate complication.  IMPRESSION: Unsuccessful attempt at right ureteral internalization (second attempt).  Fluoroscopic replacement of the 10 French right external nephrostomy  Antegrade left nephrostogram confirms patency of the left ureteral stent. Therefore the left nephrostomy catheter was removed without difficulty.   Electronically Signed   By: Daryll Brod M.D.   On: 05/01/2013 13:07   Ct Biopsy  04/24/2013   INDICATION: History of bladder cancer, now with shotty retroperitoneal lymph nodes worrisome for metastatic disease. Please perform CT guided biopsy of dominant retroperitoneal lymph node for tissue diagnostic purposes.  EXAM: CT BIOPSY OF LEFT SIDED RETROPERITONEAL LYMPH NODE  COMPARISON:  CT ABD/PELV WO CM dated 03/09/2013  MEDICATIONS: Fentanyl 50 mcg IV; Versed 1 mg IV; Benadryl 25 mg IV  ANESTHESIA/SEDATION: Sedation time  15 minutes  CONTRAST:  None  COMPLICATIONS: None immediate  PROCEDURE: Informed consent was obtained from the patient following an explanation of the procedure, risks, benefits and alternatives. A time out was performed prior to the initiation of the procedure.  The patient was positioned prone on the CT table and a limited CT was performed for procedural planning demonstrating grossly unchanged appearance of shotty retroperitoneal lymph nodes with dominant retroperitoneal lymph node measuring approximately 0.9 cm in greatest short axis diameter (image 5, series 4). The procedure was planned. The operative site was prepped and draped in the usual sterile fashion. Appropriate trajectory was confirmed with a 22 gauge spinal needle after the adjacent  tissues were anesthetized with 1% Lidocaine with epinephrine.  Under intermittent CT guidance, a 17 gauge coaxial needle was advanced into the peripheral aspect of the mass. Appropriate positioning was confirmed and 2 core needle biopsies were obtained with an 18 gauge core needle biopsy device. The co-axial needle was removed and hemostasis was achieved with manual compression.  A limited postprocedural CT was negative for hemorrhage or additional complication. A dressing was placed. The patient tolerated the procedure well without immediate postprocedural complication.  IMPRESSION: Technically successful CT guided core needle biopsy of dominant left-sided retroperitoneal lymph node.   Electronically Signed   By: Sandi Mariscal M.D.   On: 04/24/2013 14:03   Dg Chest Port 1 View  05/22/2013   CLINICAL DATA:  Week, lethargy  EXAM: PORTABLE CHEST - 1 VIEW  COMPARISON:  DG CHEST 1V PORT dated 03/31/2013; DG CHEST 2 VIEW dated 03/03/2013  FINDINGS: There are bilateral mild chronic interstitial changes. There is superimposed bilateral patchy interstitial and alveolar airspace opacities. There is relatively increased patchy There is no pleural effusion or pneumothorax. The heart and mediastinal contours are unremarkable.  The  osseous structures are unremarkable.  IMPRESSION: Bilateral patchy interstitial and alveolar airspace opacities superimposed upon mild chronic changes. The changes may reflect mild pulmonary edema versus multi lobar pneumonia.  Chronic interstitial lung disease.   Electronically Signed   By: Kathreen Devoid   On: 05/22/2013 01:00   Ir Oris Drone Cath Perc Left  04/24/2013   CLINICAL DATA:  History of bladder cancer, post bilateral percutaneous nephrostomy catheter placement (03/24/2021. Please attempt internalization of the bilateral percutaneous nephrostomy catheters.  EXAM: 1. BILATERAL NEPHROSTOGRAM. 2. LEFT SIDED URETERAL STENT PLACEMENT 3. BILATERAL PERCUTANEOUS NEPHROSTOMY CATHETER EXCHANGE   COMPARISON:  IR US GUIDE BX ASP/DRAIN dated 03/24/2013; CT ABD/PELV WO CM dated 03/09/2013  MEDICATIONS: Rocephin 1 g IV; The antibiotics were administered within 1 hour of the procedure.  ANESTHESIA/SEDATION: Versed 1 mg IV; Fentanyl 150 mcg IV  Total Moderate Sedation Time  45 minutes.  CONTRAST:  A total of 40 cc Omnipaque 300 was administered into both bilateral renal collecting systems  FLUOROSCOPY TIME:  9 minutes; 30 seconds.  COMPLICATIONS: None immediate  PROCEDURE: The procedure, risks, benefits, and alternatives were explained to the patient. Questions regarding the procedure were encouraged and answered. The patient understands and consents to the procedure.  The nephrostomy tubes and surrounding skin were prepped with Betadine in a sterile fashion, and a sterile drape was applied covering the operative field. A sterile gown and sterile gloves were used for the procedure. Local anesthesia was provided with 1% Lidocaine.  The preexisting left-sided nephrostomy tube was injected with contrast material. Fluoroscopic spot images were obtained of the collecting system and ureter to the level of the urinary bladder. The existing nephrostomy catheter was cut and cannulated with a Benson wire. Under intermittent fluoroscopic guidance, the existing nephrostomy catheter was exchanged for an 9-French vascular sheath which was advanced into the renal pelvis and utilized to advance an additional Benson wire into the renal collecting system to be used as a Chiropodist. The vascular sheath was exchanged for a Kumpe catheter which was manipulated to the level of the urinary bladder with the use of a regular Glidewire.  The Kumpe catheter was utilized to estimate to appropriate length of the ureteral stent. An 8-French, 22 cm ureteral stent was then advanced over an Amplatz superstiff guidewire. The distal portion was formed at the level of the bladder. Proximal portion was then formed at the level of the renal collecting  system.  At this point, the safety wire was utilized to replace an existing 10.2 [place] percutaneous nephrostomy catheter within the renal collecting system. The left-sided percutaneous nephrostomy catheter was capped and secured to the skin within interrupted suture. Postprocedural images were obtained in various obliquities.  Attention was now paid towards right-sided antegrade nephrostogram and potential right-sided double-J ureteral catheter placement. The existing right-sided nephrostomy catheter was injected with a small amount of contrast. Under intermittent fluoroscopic guidance, the existing nephrostomy catheter was exchanged for a 9 French vascular sheath. Contrast was injected via the existing sheath however failed to demonstrate opacification of the superior aspect of the right ureter. A Kumpe the catheter was manipulated into the caudal aspect of the right renal pelvis and a regular glidewire was utilized 2 probes the caudal aspect of the right renal pelvis however again, there is no opacification of the superior aspect of the right ureter. As such, under intermittent fluoroscopic guidance, the vascular sheath was exchanged for a new 10 French percutaneous nephrostomy catheter whose end was coiled and locked within the right  renal pelvis. A small amount of contrast was injected via the existing nephrostomy catheter and a spot fluoroscopic image was obtained. The right-sided percutaneous nephrostomy catheter was connected to a gravity bag and secured at the skin within interrupted suture.  Both percutaneous nephrostomy catheters were capped and sutured in place. Dressings were placed. The patient tolerated procedure well without immediate postprocedural complication.  FINDINGS: Nephrostogram demonstrates appropriate positioning and functioning of the existing left-sided percutaneous nephrostomy catheter. A very diminutive left ureter was ultimately opacified and ultimately allowed placement of a 22 cm  double-J ureteral stent with superior coil within the left renal pelvis and inferior coil within the urinary bladder. Given the marked narrowing of the superior aspect of the left ureter, the decision was made to replace the left-sided percutaneous nephrostomy catheter to maintain continued percutaneous access to the left kidney as the trial of internalization was performed with capped the left-sided percutaneous nephrostomy.  Initial right-sided nephrostogram demonstrates appropriate positioning and functioning of the existing right-sided percutaneous nephrostomy catheter, however there is persistent marked dilatation of the right renal pelvis and blunting of the renal calyces. Despite injection of a moderate to large volume of contrast into the right renal pelvis including selective injection from the caudal aspect of the left renal pelvis, the UPJ and superior aspect of the right ureter was never opacified. Prolonged efforts were made to cannulate the suspected location of the superior aspect of the right ureter however this ultimately proved unsuccessful. As such, a new left-sided percutaneous nephrostomy catheter was coiled and locked within left renal pelvis.  IMPRESSION: 1. Successful placement of left-sided 22 cm ureteral stent. 2. Successful exchange of left-sided 10 French nephrostomy catheter to maintain continued percutaneous access to the left kidney. This catheter will be capped for a trial of attempted internalization. 3. Unsuccessful attempted right-sided percutaneous ureteral stent placement secondary to non opacification of the superior aspect of the right ureter. 4. Successful fluoroscopic guided exchange of right-sided 10.2 French nephrostomy catheter.  PLAN: The patient will return early next week for left-sided antegrade nephrostogram and potential left-sided percutaneous nephrostomy catheter removal. Note, the patient was given an extra gravity bag and educated to connect the left-sided  percutaneous nephrostomy catheter in the setting of the onset of left-sided flank pain, fever or chills.  During next week's left-sided antegrade nephrostogram, an additional attempt will be made at placing a right-sided double-J ureteral stent.   Electronically Signed   By: Sandi Mariscal M.D.   On: 04/24/2013 14:44         Subjective: Patient is not having any further hematemesis but she just remains nauseous. Denies fevers, chills, chest pain, vomiting, diarrhea, abdominal pain, dysuria. She is constipated. Denies any headaches or dizziness. No visual disturbance.  Objective: Filed Vitals:   05/22/13 0305 05/22/13 0330 05/22/13 0412 05/22/13 0949  BP: 116/67 123/70 99/69 115/56  Pulse:   89 89  Temp:   98.4 F (36.9 C)   TempSrc:   Oral   Resp: 19 18 18    Height:   5\' 1"  (1.549 m)   Weight:   57.652 kg (127 lb 1.6 oz)   SpO2:   99%     Intake/Output Summary (Last 24 hours) at 05/22/13 1202 Last data filed at 05/22/13 1016  Gross per 24 hour  Intake    440 ml  Output    300 ml  Net    140 ml   Weight change:  Exam:   General:  Pt is alert, follows commands  appropriately, not in acute distress  HEENT: No icterus, No thrush,[place]/AT  Cardiovascular: RRR, S1/S2, no rubs, no gallops  Respiratory: Bilateral scattered rales. No wheezing. Good air movement.  Abdomen: Soft/+BS, non tender, non distended, no guarding  Extremities: 1+LE edema, No lymphangitis, No petechiae, No rashes, no synovitis  Data Reviewed: Basic Metabolic Panel:  Recent Labs Lab 05/22/13 0015 05/22/13 0445  NA 130* 132*  K 5.0 5.2  CL 89* 91*  CO2 27 31  GLUCOSE 208* 187*  BUN 59* 56*  CREATININE 2.22* 2.20*  CALCIUM 9.7 9.4   Liver Function Tests:  Recent Labs Lab 05/22/13 0015 05/22/13 0445  AST 19 20  ALT 15 13  ALKPHOS 338* 316*  BILITOT 0.4 0.4  PROT 7.5 7.2  ALBUMIN 2.2* 2.0*   No results found for this basename: LIPASE, AMYLASE,  in the last 168 hours No results found for  this basename: AMMONIA,  in the last 168 hours CBC:  Recent Labs Lab 05/22/13 0015 05/22/13 0445  WBC 10.5 8.9  NEUTROABS 7.3  --   HGB 11.7* 11.0*  HCT 37.7 35.4*  MCV 81.4 81.2  PLT 326 313   Cardiac Enzymes:  Recent Labs Lab 05/22/13 0015  TROPONINI <0.30   BNP: No components found with this basename: POCBNP,  CBG:  Recent Labs Lab 05/22/13 0721 05/22/13 1120  GLUCAP 127* 66*    Recent Results (from the past 240 hour(s))  MRSA PCR SCREENING     Status: Abnormal   Collection Time    05/22/13  4:00 AM      Result Value Ref Range Status   MRSA by PCR POSITIVE (*) NEGATIVE Final   Comment:            The GeneXpert MRSA Assay (FDA     approved for NASAL specimens     only), is one component of a     comprehensive MRSA colonization     surveillance program. It is not     intended to diagnose MRSA     infection nor to guide or     monitor treatment for     MRSA infections.     RESULT CALLED TO, READ BACK BY AND VERIFIED WITH:     K. RYAN RN AT [id number] ON 05.04.15 BY SHUEA     Scheduled Meds: . Chlorhexidine Gluconate Cloth  6 each Topical Q0600  . docusate sodium  100 mg Oral BID  . insulin aspart  0-5 Units Subcutaneous QHS  . insulin aspart  0-9 Units Subcutaneous TID WC  . insulin glargine  30 Units Subcutaneous QHS  . levothyroxine  125 mcg Oral QAC breakfast  . metoprolol succinate  50 mg Oral BID  . morphine      . mupirocin ointment   Nasal BID  . pantoprazole  40 mg Oral Q1200  . piperacillin-tazobactam (ZOSYN)  IV  2.25 g Intravenous 4 times per day  . senna-docusate  1 tablet Oral BID  . sodium bicarbonate  650 mg Oral BID  . [START ON 05/24/2013] vancomycin  750 mg Intravenous Q48H   Continuous Infusions: . sodium chloride 75 mL/hr at 05/22/13 0408     Orson Eva, DO  [facility] Pager [phone]  If 7PM-7AM, please contact night-coverage [email or web address] Password [email or web address] 05/22/2013, 12:02 PM   LOS: 0 days

## 2013-05-22 NOTE — Progress Notes (Signed)
Pt and pts family asked to have PAS hose off.  They are hurting pts legs, she is on coumadin for prior DVT, PE.  Pts family states they are probably going with hospice consult today and just want pt comfortable.

## 2013-05-22 NOTE — Discharge Summary (Signed)
Physician Discharge Summary  Heather [name] IHK:[id number] DOB: 1940-05-18 DOA: 05/22/2013  PCP: Rogene Houston, MD  Admit date: 05/22/2013 Discharge date: 05/23/13 Recommendations for Outpatient Follow-up:  1. 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 Zannie Cove. She also had bilateral hydronephrosis secondary to tumor compression and underwent bilateral nephrostomy tube placement. On 04/24/2013, the patient underwent a left double-J stent placement on the left with exchange of left nephrostomy tube. There was unsuccessful attempt to place double-J stent in the right ureter. On 05/01/2013, there was unsuccessful second attempt for right regular rate and stent, but her right nephrostomy tube was exchanged. The patient was discharged from the hospital on 04/03/2013 to skilled nursing facility ([facility]) in [place]. She has been home for the past week. She presented to [facility] secondary to coffee grounds emesis as well as hypoxemia was noted by occupational therapy at home. The patient is on warfarin for PE and has a history of IVC filter. INR was 3.07 on 05/22/2013. Palliative medicine was consulted to assist with goals of care. After discussion with the patient and her family, they decided to change the focus of care to full comfort. The patient be discharged home with hospice care. Assessment/Plan:  Acute Respiratory Failure  -No longer an active issue is the patient's focus of care has been transitioned to full comfort -secondary to pneumonia  -supplemental oxygan  -pulmonary hygiene  HCAP  -No longer an active issue is the patient's focus of care has been transitioned to full comfort -Continue empiric vancomycin and Zosyn pending culture data during the hospitalization -Pulmonary hygiene  -IV fluids  Acute on chronic CKD  stage III  -No longer an active issue is the patient's focus of care has been transitioned to full comfort -Secondary to hypovolemia, obstructive uropathy, and infectious process  -Baseline creatinine 1.5-1.8  -IV fluids during the hospitalization Hematemesis  -No longer an active issue is the patient's focus of care has been transitioned to full comfort -The patient remained hemodynamically stable  -hold GI consult  -Hemoglobin stable  -Baseline hemoglobin 10-11  Pyuria  -No longer an active issue is the patient's focus of care has been transitioned to full comfort -Continue Zosyn and vancomycin pending urine culture  Urothelial Carcinoma of Bladder  -No longer an active issue is the patient's focus of care has been transitioned to full comfort -Status post bilateral nephrostomy tubes secondary to obstructive uropathy  -Right nephrostomy tube exchange 05/01/2013  -Left nephrostomy tube exchange 04/24/2013  -CT of the brain showed sclerosis of clivus concerning for possible metastatic disease  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 Had been maintained on chronic anticoagulation. Warfarin resumed 3/10. INR therapeutic. Heparin stopped. INR was reversed for procedures earlier this admission.  Hypertension and chronic diastolic dysfunction  -Hold antihypertensives at this time as the patient's blood pressure is soft  Diabetes mellitus type 2  -Discontinue Lantus as the patient had episode of mild hypoglycemia without any insulin this morning or last night  -NovoLog sliding scale  -check A1C  -insulin is discontinued altogether as pt focus of care is now on full comfort Constipation  -Start cathartics  -pt had BM with  Fleets Enema and felt better Hypothyroidism  -Continue Synthroid Goals of care -Palliative medicine was consulted and  met with the family to discuss the goals of care -Patient and family have changed the focus of care to full comfort -pt will go home with hospice care with Roxanol prn for pain    Discharge Condition: Stable  Disposition:  home with hospice  Diet: Regular Wt Readings from Last 3 Encounters:  05/22/13 57.652 kg (127 lb 1.6 oz)  04/10/13 68.629 kg (151 lb 4.8 oz)  04/03/13 74.9 kg (165 lb 2 oz)      Consultants: Palliative medicine  Discharge Exam: Filed Vitals:   05/23/13 0626  BP: 116/57  Pulse: 99  Temp: 98.5 F (36.9 C)  Resp: 18   Filed Vitals:   05/22/13 1441 05/22/13 2112 05/23/13 0215 05/23/13 0626  BP: 102/51 93/79 113/77 116/57  Pulse: 77 88 83 99  Temp: 98 F (36.7 C) 98.4 F (36.9 C) 98.3 F (36.8 C) 98.5 F (36.9 C)  TempSrc: Oral Oral Oral Oral  Resp: _0 Height:      Weight:      SpO2: 90% 100% 94% 99%   General: A&O x 3, NAD, pleasant, cooperative Cardiovascular: RRR, no rub, no gallop, no S3 Respiratory: Bibasilar scattered rales. No wheezing Abdomen:soft, nontender, nondistended, positive bowel sounds Extremities: 1+LE edema, No lymphangitis, no petechiae  Discharge Instructions      Discharge Orders   Future Appointments Provider Department Dept Phone   06/26/2013 8:30 AM [facility] [phone]   07/25/2013 11:00 AM [place] [phone]   07/26/2013 1:00 PM [place] Provider [place] [phone]   Future Orders Complete By Expires   Diet general  As directed        Medication List    STOP taking these medications       Calcium Carbonate-Vit D-Min 600-200 MG-UNIT Tabs     cloNIDine 0.1 MG tablet  Commonly known as:  CATAPRES     D3 ADULT PO     ezetimibe 10 MG tablet  Commonly known as:  ZETIA     ferrous sulfate 325 (65 FE) MG tablet     Fish Oil 1000 MG Caps     furosemide 40 MG tablet  Commonly known  as:  LASIX     insulin glargine 100 UNIT/ML injection  Commonly known as:  LANTUS     metoprolol succinate 50 MG 24 hr tablet  Commonly known as:  TOPROL-XL     multivitamin with minerals Tabs tablet     potassium chloride SA 20 MEQ tablet  Commonly known as:  K-DUR,KLOR-CON     sodium bicarbonate 650 MG tablet     warfarin 1 MG tablet  Commonly known as:  COUMADIN      TAKE these medications       acetaminophen 325 MG tablet  Commonly known as:  TYLENOL  Take 2 tablets (650 mg total) by mouth every 6 (six) hours as needed for mild pain or fever (or Fever >/= 101).     DSS 100 MG Caps  Take 100 mg by mouth 2 (two) times daily.     HYDROcodone-acetaminophen 5-325 MG per tablet  Commonly known as:  NORCO/VICODIN  Take 1-2 tablets by mouth every 4 (four) hours as needed for moderate pain.     ipratropium-albuterol 0.5-2.5 (3) MG/3ML Soln  Commonly known as:  DUONEB  Take  3 mLs by nebulization every 4 (four) hours as needed.     levothyroxine 125 MCG tablet  Commonly known as:  SYNTHROID, LEVOTHROID  Take 1 tablet (125 mcg total) by mouth daily before breakfast.     morphine CONCENTRATE 10 mg / 0.5 ml concentrated solution  Take 0.25 mLs (5 mg total) by mouth every 2 (two) hours as needed for moderate pain or shortness of breath.     nystatin-triamcinolone cream  Commonly known as:  MYCOLOG II  Apply 1 application topically 2 (two) times daily. Apply to area of a skin rash twice daily until rash cleared     ondansetron 4 MG disintegrating tablet  Commonly known as:  ZOFRAN ODT  Take 1 tablet (4 mg total) by mouth every 8 (eight) hours as needed for nausea or vomiting.     senna-docusate 8.6-50 MG per tablet  Commonly known as:  Senokot-S  Take 1 tablet by mouth 2 (two) times daily.         The results of significant diagnostics from this hospitalization (including imaging, microbiology, ancillary and laboratory) are listed below for reference.    Significant  Diagnostic Studies: Dg Abd 1 View  05/22/2013   CLINICAL DATA:  Abdomen and back pain.  Evaluate stool burden.  EXAM: ABDOMEN - 1 VIEW  COMPARISON:  None.  FINDINGS: Large stool burden is present in the right colon. Left ureteral stent is present. Extensive postsurgical changes in the abdomen. Right upper quadrant nephrostomy is present. Both nephrostomy tubes appear appropriately reconstituted. IVC filter is present. Moderate amount of stool is present within the rectum.  IMPRESSION: 1. Left ureteral stent and right nephrostomy tube. 2. Moderate stool burden, with prominent stool in the ascending colon and moderate amount of stool in the rectum. 3. Postsurgical in postprocedural changes in the abdomen.   Electronically Signed   By: Dereck Ligas M.D.   On: 05/22/2013 07:57   Ct Head Wo Contrast  05/22/2013   CLINICAL DATA:  Weakness.  History of bladder cancer.  EXAM: CT HEAD WITHOUT CONTRAST  TECHNIQUE: Contiguous axial images were obtained from the base of the skull through the vertex without intravenous contrast.  COMPARISON:  CT HEAD W/O CM dated 10/24/2007; CT BIOPSY dated 04/24/2013  FINDINGS: The ventricles and sulci are normal for age. No intraparenchymal hemorrhage, mass effect nor midline shift. Confluent supratentorial white matter hypodensities are within normal range for patient's age and though non-specific suggest sequelae of chronic small vessel ischemic disease. No acute large vascular territory infarcts. Tiny cystic right basal ganglia lacunar infarcts.  No abnormal extra-axial fluid collections. Basal cisterns are patent. Moderate calcific atherosclerosis of the carotid siphons.  No skull fracture. The included ocular globes and orbital contents are non-suspicious. Paranasal sinuses are well aerated. Trace left mastoid tip effusion. Osteopenia, with patchy sclerotic lesions in the clivus, which appear new.  IMPRESSION: No acute intracranial process.  Involutional changes. Moderate to severe  white matter changes suggest chronic small vessel ischemic disease, with additional remote basal ganglia lacunar infarcts.  Osteopenia, with new sclerotic lesions in the clivus, though nonspecific can be seen with metastatic disease, recommend clinical correlation.   Electronically Signed   By: Elon Alas   On: 05/22/2013 01:38   Ir Nephrostomy Tube Change  05/01/2013   CLINICAL DATA:  Bladder cancer, retroperitoneal mass, bilateral ureteral obstructions, recently placed left ureteral stent and bilateral nephrostomies  EXAM: BILATERAL ANTEGRADE NEPHROSTOGRAMS  LEFT NEPHROSTOMY REMOVAL  RIGHT NEPHROSTOMY EXCHANGE AFTER FAILED ATTEMPT AT  RIGHT URETERAL INTERNALIZATION (second attempt)  Date:  4/13/20154/13/2015 11:32 AM  Radiologist:  Jerilynn Mages. Daryll Brod, MD  Guidance:  Fluoroscopic  FLUOROSCOPY TIME:  3 min 12 seconds  MEDICATIONS AND MEDICAL HISTORY: 1 g Rocephin administered within 1 hr of the procedure, 1 mg Versed, 50 mcg fentanyl  ANESTHESIA/SEDATION: 13 min  CONTRAST:  20 cc  COMPLICATIONS: No immediate  PROCEDURE: Informed consent was obtained from the patient following explanation of the procedure, risks, benefits and alternatives. The patient understands, agrees and consents for the procedure. All questions were addressed. A time out was performed.  Maximal barrier sterile technique utilized including caps, mask, sterile gowns, sterile gloves, large sterile drape, hand hygiene, and Betadine.  Right: Initially, under sterile conditions and local anesthesia, the existing right nephrostomy catheter was cut and removed over a guidewire. A Kumpe catheter was advanced into the right collecting system. Several attempts were made with a Kumpe catheter and areas guidewires however access through the right UPJ obstruction could not be obtained. 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 This is not changed since the prior study. Therefore over guidewire, a right 10 [place] external  nephrostomy was replaced. Retention loop formed in the renal pelvis. Contrast injection confirms position. Right nephrostomy re- secured with a Prolene suture and connected to external gravity bag. Sterile dressing applied. No immediate complication.  Left: Under sterile conditions, the existing left external nephrostomy catheter was injected with contrast for antegrade nephrostogram. This confirms patency of the left internal ureteral stent in good position. Therefore, the left nephrostomy catheter was cut and removed without difficulty over a guidewire. Patient tolerated the procedure well. No immediate complication.  IMPRESSION: Unsuccessful attempt at right ureteral internalization (second attempt).  Fluoroscopic replacement of the 10 French right external nephrostomy  Antegrade left nephrostogram confirms patency of the left ureteral stent. Therefore the left nephrostomy catheter was removed without difficulty.   Electronically Signed   By: Daryll Brod M.D.   On: 05/01/2013 13:07   Ir Nephrostomy Tube Change  04/24/2013   CLINICAL DATA:  History of bladder cancer, post bilateral percutaneous nephrostomy catheter placement (03/24/2021. Please attempt internalization of the bilateral percutaneous nephrostomy catheters.  EXAM: 1. BILATERAL NEPHROSTOGRAM. 2. LEFT SIDED URETERAL STENT PLACEMENT 3. BILATERAL PERCUTANEOUS NEPHROSTOMY CATHETER EXCHANGE  COMPARISON:  IR US GUIDE BX ASP/DRAIN dated 03/24/2013; CT ABD/PELV WO CM dated 03/09/2013  MEDICATIONS: Rocephin 1 g IV; The antibiotics were administered within 1 hour of the procedure.  ANESTHESIA/SEDATION: Versed 1 mg IV; Fentanyl 150 mcg IV  Total Moderate Sedation Time  45 minutes.  CONTRAST:  A total of 40 cc Omnipaque 300 was administered into both bilateral renal collecting systems  FLUOROSCOPY TIME:  9 minutes; 30 seconds.  COMPLICATIONS: None immediate  PROCEDURE: The procedure, risks, benefits, and alternatives were explained to the patient. Questions  regarding the procedure were encouraged and answered. The patient understands and consents to the procedure.  The nephrostomy tubes and surrounding skin were prepped with Betadine in a sterile fashion, and a sterile drape was applied covering the operative field. A sterile gown and sterile gloves were used for the procedure. Local anesthesia was provided with 1% Lidocaine.  The preexisting left-sided nephrostomy tube was injected with contrast material. Fluoroscopic spot images were obtained of the collecting system and ureter to the level of the urinary bladder. The existing nephrostomy catheter was cut and cannulated with a Benson wire. Under intermittent fluoroscopic guidance, the existing nephrostomy catheter was exchanged for an 9-French vascular sheath which  was advanced into the renal pelvis and utilized to advance an additional [facility] wire into the renal collecting system to be used as a Chiropodist. The vascular sheath was exchanged for a Kumpe catheter which was manipulated to the level of the urinary bladder with the use of a regular Glidewire.  The Kumpe catheter was utilized to estimate to appropriate length of the ureteral stent. An 8-French, 22 cm ureteral stent was then advanced over an Amplatz superstiff guidewire. The distal portion was formed at the level of the bladder. Proximal portion was then formed at the level of the renal collecting system.  At this point, the safety wire was utilized to replace an existing 10.2 [place] percutaneous nephrostomy catheter within the renal collecting system. The left-sided percutaneous nephrostomy catheter was capped and secured to the skin within interrupted suture. Postprocedural images were obtained in various obliquities.  Attention was now paid towards right-sided antegrade nephrostogram and potential right-sided double-J ureteral catheter placement. The existing right-sided nephrostomy catheter was injected with a small amount of contrast. Under  intermittent fluoroscopic guidance, the existing nephrostomy catheter was exchanged for a 9 French vascular sheath. Contrast was injected via the existing sheath however failed to demonstrate opacification of the superior aspect of the right ureter. A Kumpe the catheter was manipulated into the caudal aspect of the right renal pelvis and a regular glidewire was utilized 2 probes the caudal aspect of the right renal pelvis however again, there is no opacification of the superior aspect of the right ureter. As such, under intermittent fluoroscopic guidance, the vascular sheath was exchanged for a new 10 French percutaneous nephrostomy catheter whose end was coiled and locked within the right renal pelvis. A small amount of contrast was injected via the existing nephrostomy catheter and a spot fluoroscopic image was obtained. The right-sided percutaneous nephrostomy catheter was connected to a gravity bag and secured at the skin within interrupted suture.  Both percutaneous nephrostomy catheters were capped and sutured in place. Dressings were placed. The patient tolerated procedure well without immediate postprocedural complication.  FINDINGS: Nephrostogram demonstrates appropriate positioning and functioning of the existing left-sided percutaneous nephrostomy catheter. A very diminutive left ureter was ultimately opacified and ultimately allowed placement of a 22 cm double-J ureteral stent with superior coil within the left renal pelvis and inferior coil within the urinary bladder. Given the marked narrowing of the superior aspect of the left ureter, the decision was made to replace the left-sided percutaneous nephrostomy catheter to maintain continued percutaneous access to the left kidney as the trial of internalization was performed with capped the left-sided percutaneous nephrostomy.  Initial right-sided nephrostogram demonstrates appropriate positioning and functioning of the existing right-sided percutaneous  nephrostomy catheter, however there is persistent marked dilatation of the right renal pelvis and blunting of the renal calyces. Despite injection of a moderate to large volume of contrast into the right renal pelvis including selective injection from the caudal aspect of the left renal pelvis, the UPJ and superior aspect of the right ureter was never opacified. Prolonged efforts were made to cannulate the suspected location of the superior aspect of the right ureter however this ultimately proved unsuccessful. As such, a new left-sided percutaneous nephrostomy catheter was coiled and locked within left renal pelvis.  IMPRESSION: 1. Successful placement of left-sided 22 cm ureteral stent. 2. Successful exchange of left-sided 10 French nephrostomy catheter to maintain continued percutaneous access to the left kidney. This catheter will be capped for a trial of attempted internalization. 3. Unsuccessful attempted  right-sided percutaneous ureteral stent placement secondary to non opacification of the superior aspect of the right ureter. 4. Successful fluoroscopic guided exchange of right-sided 10.2 French nephrostomy catheter.  PLAN: The patient will return early next week for left-sided antegrade nephrostogram and potential left-sided percutaneous nephrostomy catheter removal. Note, the patient was given an extra gravity bag and educated to connect the left-sided percutaneous nephrostomy catheter in the setting of the onset of left-sided flank pain, fever or chills.  During next week's left-sided antegrade nephrostogram, an additional attempt will be made at placing a right-sided double-J ureteral stent.   Electronically Signed   By: Sandi Mariscal M.D.   On: 04/24/2013 14:44   Ir Nephrostomy Tube Change  04/24/2013   CLINICAL DATA:  History of bladder cancer, post bilateral percutaneous nephrostomy catheter placement (03/24/2021. Please attempt internalization of the bilateral percutaneous nephrostomy catheters.   EXAM: 1. BILATERAL NEPHROSTOGRAM. 2. LEFT SIDED URETERAL STENT PLACEMENT 3. BILATERAL PERCUTANEOUS NEPHROSTOMY CATHETER EXCHANGE  COMPARISON:  IR US GUIDE BX ASP/DRAIN dated 03/24/2013; CT ABD/PELV WO CM dated 03/09/2013  MEDICATIONS: Rocephin 1 g IV; The antibiotics were administered within 1 hour of the procedure.  ANESTHESIA/SEDATION: Versed 1 mg IV; Fentanyl 150 mcg IV  Total Moderate Sedation Time  45 minutes.  CONTRAST:  A total of 40 cc Omnipaque 300 was administered into both bilateral renal collecting systems  FLUOROSCOPY TIME:  9 minutes; 30 seconds.  COMPLICATIONS: None immediate  PROCEDURE: The procedure, risks, benefits, and alternatives were explained to the patient. Questions regarding the procedure were encouraged and answered. The patient understands and consents to the procedure.  The nephrostomy tubes and surrounding skin were prepped with Betadine in a sterile fashion, and a sterile drape was applied covering the operative field. A sterile gown and sterile gloves were used for the procedure. Local anesthesia was provided with 1% Lidocaine.  The preexisting left-sided nephrostomy tube was injected with contrast material. Fluoroscopic spot images were obtained of the collecting system and ureter to the level of the urinary bladder. The existing nephrostomy catheter was cut and cannulated with a Benson wire. Under intermittent fluoroscopic guidance, the existing nephrostomy catheter was exchanged for an 9-French vascular sheath which was advanced into the renal pelvis and utilized to advance an additional Benson wire into the renal collecting system to be used as a Chiropodist. The vascular sheath was exchanged for a Kumpe catheter which was manipulated to the level of the urinary bladder with the use of a regular Glidewire.  The Kumpe catheter was utilized to estimate to appropriate length of the ureteral stent. An 8-French, 22 cm ureteral stent was then advanced over an Amplatz superstiff guidewire.  The distal portion was formed at the level of the bladder. Proximal portion was then formed at the level of the renal collecting system.  At this point, the safety wire was utilized to replace an existing 10.2 [place] percutaneous nephrostomy catheter within the renal collecting system. The left-sided percutaneous nephrostomy catheter was capped and secured to the skin within interrupted suture. Postprocedural images were obtained in various obliquities.  Attention was now paid towards right-sided antegrade nephrostogram and potential right-sided double-J ureteral catheter placement. The existing right-sided nephrostomy catheter was injected with a small amount of contrast. Under intermittent fluoroscopic guidance, the existing nephrostomy catheter was exchanged for a 9 French vascular sheath. Contrast was injected via the existing sheath however failed to demonstrate opacification of the superior aspect of the right ureter. A Kumpe the catheter was manipulated into the caudal  aspect of the right renal pelvis and a regular glidewire was utilized 2 probes the caudal aspect of the right renal pelvis however again, there is no opacification of the superior aspect of the right ureter. As such, under intermittent fluoroscopic guidance, the vascular sheath was exchanged for a new 10 French percutaneous nephrostomy catheter whose end was coiled and locked within the right renal pelvis. A small amount of contrast was injected via the existing nephrostomy catheter and a spot fluoroscopic image was obtained. The right-sided percutaneous nephrostomy catheter was connected to a gravity bag and secured at the skin within interrupted suture.  Both percutaneous nephrostomy catheters were capped and sutured in place. Dressings were placed. The patient tolerated procedure well without immediate postprocedural complication.  FINDINGS: Nephrostogram demonstrates appropriate positioning and functioning of the existing left-sided  percutaneous nephrostomy catheter. A very diminutive left ureter was ultimately opacified and ultimately allowed placement of a 22 cm double-J ureteral stent with superior coil within the left renal pelvis and inferior coil within the urinary bladder. Given the marked narrowing of the superior aspect of the left ureter, the decision was made to replace the left-sided percutaneous nephrostomy catheter to maintain continued percutaneous access to the left kidney as the trial of internalization was performed with capped the left-sided percutaneous nephrostomy.  Initial right-sided nephrostogram demonstrates appropriate positioning and functioning of the existing right-sided percutaneous nephrostomy catheter, however there is persistent marked dilatation of the right renal pelvis and blunting of the renal calyces. Despite injection of a moderate to large volume of contrast into the right renal pelvis including selective injection from the caudal aspect of the left renal pelvis, the UPJ and superior aspect of the right ureter was never opacified. Prolonged efforts were made to cannulate the suspected location of the superior aspect of the right ureter however this ultimately proved unsuccessful. As such, a new left-sided percutaneous nephrostomy catheter was coiled and locked within left renal pelvis.  IMPRESSION: 1. Successful placement of left-sided 22 cm ureteral stent. 2. Successful exchange of left-sided 10 French nephrostomy catheter to maintain continued percutaneous access to the left kidney. This catheter will be capped for a trial of attempted internalization. 3. Unsuccessful attempted right-sided percutaneous ureteral stent placement secondary to non opacification of the superior aspect of the right ureter. 4. Successful fluoroscopic guided exchange of right-sided 10.2 French nephrostomy catheter.  PLAN: The patient will return early next week for left-sided antegrade nephrostogram and potential left-sided  percutaneous nephrostomy catheter removal. Note, the patient was given an extra gravity bag and educated to connect the left-sided percutaneous nephrostomy catheter in the setting of the onset of left-sided flank pain, fever or chills.  During next week's left-sided antegrade nephrostogram, an additional attempt will be made at placing a right-sided double-J ureteral stent.   Electronically Signed   By: Sandi Mariscal M.D.   On: 04/24/2013 14:44   Ir Nephrostogram Left  05/01/2013   CLINICAL DATA:  Bladder cancer, retroperitoneal mass, bilateral ureteral obstructions, recently placed left ureteral stent and bilateral nephrostomies  EXAM: BILATERAL ANTEGRADE NEPHROSTOGRAMS  LEFT NEPHROSTOMY REMOVAL  RIGHT NEPHROSTOMY EXCHANGE AFTER FAILED ATTEMPT AT RIGHT URETERAL INTERNALIZATION (second attempt)  Date:  4/13/20154/13/2015 11:32 AM  Radiologist:  Jerilynn Mages. Daryll Brod, MD  Guidance:  Fluoroscopic  FLUOROSCOPY TIME:  3 min 12 seconds  MEDICATIONS AND MEDICAL HISTORY: 1 g Rocephin administered within 1 hr of the procedure, 1 mg Versed, 50 mcg fentanyl  ANESTHESIA/SEDATION: 13 min  CONTRAST:  20 cc  COMPLICATIONS: No immediate  PROCEDURE: Informed consent was obtained from the patient following explanation of the procedure, risks, benefits and alternatives. The patient understands, agrees and consents for the procedure. All questions were addressed. A time out was performed.  Maximal barrier sterile technique utilized including caps, mask, sterile gowns, sterile gloves, large sterile drape, hand hygiene, and Betadine.  Right: Initially, under sterile conditions and local anesthesia, the existing right nephrostomy catheter was cut and removed over a guidewire. A Kumpe catheter was advanced into the right collecting system. Several attempts were made with a Kumpe catheter and areas guidewires however access through the right UPJ obstruction could not be obtained. 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  This is not changed since the prior study. Therefore over guidewire, a right 10 [place] external nephrostomy was replaced. Retention loop formed in the renal pelvis. Contrast injection confirms position. Right nephrostomy re- secured with a Prolene suture and connected to external gravity bag. Sterile dressing applied. No immediate complication.  Left: Under sterile conditions, the existing left external nephrostomy catheter was injected with contrast for antegrade nephrostogram. This confirms patency of the left internal ureteral stent in good position. Therefore, the left nephrostomy catheter was cut and removed without difficulty over a guidewire. Patient tolerated the procedure well. No immediate complication.  IMPRESSION: Unsuccessful attempt at right ureteral internalization (second attempt).  Fluoroscopic replacement of the 10 French right external nephrostomy  Antegrade left nephrostogram confirms patency of the left ureteral stent. Therefore the left nephrostomy catheter was removed without difficulty.   Electronically Signed   By: Daryll Brod M.D.   On: 05/01/2013 13:07   Ir Nephrostogram Left  04/24/2013   CLINICAL DATA:  History of bladder cancer, post bilateral percutaneous nephrostomy catheter placement (03/24/2021. Please attempt internalization of the bilateral percutaneous nephrostomy catheters.  EXAM: 1. BILATERAL NEPHROSTOGRAM. 2. LEFT SIDED URETERAL STENT PLACEMENT 3. BILATERAL PERCUTANEOUS NEPHROSTOMY CATHETER EXCHANGE  COMPARISON:  IR US GUIDE BX ASP/DRAIN dated 03/24/2013; CT ABD/PELV WO CM dated 03/09/2013  MEDICATIONS: Rocephin 1 g IV; The antibiotics were administered within 1 hour of the procedure.  ANESTHESIA/SEDATION: Versed 1 mg IV; Fentanyl 150 mcg IV  Total Moderate Sedation Time  45 minutes.  CONTRAST:  A total of 40 cc Omnipaque 300 was administered into both bilateral renal collecting systems  FLUOROSCOPY TIME:  9 minutes; 30 seconds.  COMPLICATIONS: None immediate  PROCEDURE: The  procedure, risks, benefits, and alternatives were explained to the patient. Questions regarding the procedure were encouraged and answered. The patient understands and consents to the procedure.  The nephrostomy tubes and surrounding skin were prepped with Betadine in a sterile fashion, and a sterile drape was applied covering the operative field. A sterile gown and sterile gloves were used for the procedure. Local anesthesia was provided with 1% Lidocaine.  The preexisting left-sided nephrostomy tube was injected with contrast material. Fluoroscopic spot images were obtained of the collecting system and ureter to the level of the urinary bladder. The existing nephrostomy catheter was cut and cannulated with a Benson wire. Under intermittent fluoroscopic guidance, the existing nephrostomy catheter was exchanged for an 9-French vascular sheath which was advanced into the renal pelvis and utilized to advance an additional Benson wire into the renal collecting system to be used as a Chiropodist. The vascular sheath was exchanged for a Kumpe catheter which was manipulated to the level of the urinary bladder with the use of a regular Glidewire.  The Kumpe catheter was utilized to estimate to appropriate length of the ureteral  stent. An 8-French, 22 cm ureteral stent was then advanced over an Amplatz superstiff guidewire. The distal portion was formed at the level of the bladder. Proximal portion was then formed at the level of the renal collecting system.  At this point, the safety wire was utilized to replace an existing 10.2 [place] percutaneous nephrostomy catheter within the renal collecting system. The left-sided percutaneous nephrostomy catheter was capped and secured to the skin within interrupted suture. Postprocedural images were obtained in various obliquities.  Attention was now paid towards right-sided antegrade nephrostogram and potential right-sided double-J ureteral catheter placement. The existing  right-sided nephrostomy catheter was injected with a small amount of contrast. Under intermittent fluoroscopic guidance, the existing nephrostomy catheter was exchanged for a 9 French vascular sheath. Contrast was injected via the existing sheath however failed to demonstrate opacification of the superior aspect of the right ureter. A Kumpe the catheter was manipulated into the caudal aspect of the right renal pelvis and a regular glidewire was utilized 2 probes the caudal aspect of the right renal pelvis however again, there is no opacification of the superior aspect of the right ureter. As such, under intermittent fluoroscopic guidance, the vascular sheath was exchanged for a new 10 French percutaneous nephrostomy catheter whose end was coiled and locked within the right renal pelvis. A small amount of contrast was injected via the existing nephrostomy catheter and a spot fluoroscopic image was obtained. The right-sided percutaneous nephrostomy catheter was connected to a gravity bag and secured at the skin within interrupted suture.  Both percutaneous nephrostomy catheters were capped and sutured in place. Dressings were placed. The patient tolerated procedure well without immediate postprocedural complication.  FINDINGS: Nephrostogram demonstrates appropriate positioning and functioning of the existing left-sided percutaneous nephrostomy catheter. A very diminutive left ureter was ultimately opacified and ultimately allowed placement of a 22 cm double-J ureteral stent with superior coil within the left renal pelvis and inferior coil within the urinary bladder. Given the marked narrowing of the superior aspect of the left ureter, the decision was made to replace the left-sided percutaneous nephrostomy catheter to maintain continued percutaneous access to the left kidney as the trial of internalization was performed with capped the left-sided percutaneous nephrostomy.  Initial right-sided nephrostogram  demonstrates appropriate positioning and functioning of the existing right-sided percutaneous nephrostomy catheter, however there is persistent marked dilatation of the right renal pelvis and blunting of the renal calyces. Despite injection of a moderate to large volume of contrast into the right renal pelvis including selective injection from the caudal aspect of the left renal pelvis, the UPJ and superior aspect of the right ureter was never opacified. Prolonged efforts were made to cannulate the suspected location of the superior aspect of the right ureter however this ultimately proved unsuccessful. As such, a new left-sided percutaneous nephrostomy catheter was coiled and locked within left renal pelvis.  IMPRESSION: 1. Successful placement of left-sided 22 cm ureteral stent. 2. Successful exchange of left-sided 10 French nephrostomy catheter to maintain continued percutaneous access to the left kidney. This catheter will be capped for a trial of attempted internalization. 3. Unsuccessful attempted right-sided percutaneous ureteral stent placement secondary to non opacification of the superior aspect of the right ureter. 4. Successful fluoroscopic guided exchange of right-sided 10.2 French nephrostomy catheter.  PLAN: The patient will return early next week for left-sided antegrade nephrostogram and potential left-sided percutaneous nephrostomy catheter removal. Note, the patient was given an extra gravity bag and educated to connect the left-sided percutaneous nephrostomy catheter  in the setting of the onset of left-sided flank pain, fever or chills.  During next week's left-sided antegrade nephrostogram, an additional attempt will be made at placing a right-sided double-J ureteral stent.   Electronically Signed   By: Sandi Mariscal M.D.   On: 04/24/2013 14:44   Ir Nephrostogram Right  05/01/2013   CLINICAL DATA:  Bladder cancer, retroperitoneal mass, bilateral ureteral obstructions, recently placed left  ureteral stent and bilateral nephrostomies  EXAM: BILATERAL ANTEGRADE NEPHROSTOGRAMS  LEFT NEPHROSTOMY REMOVAL  RIGHT NEPHROSTOMY EXCHANGE AFTER FAILED ATTEMPT AT RIGHT URETERAL INTERNALIZATION (second attempt)  Date:  4/13/20154/13/2015 11:32 AM  Radiologist:  Jerilynn Mages. Daryll Brod, MD  Guidance:  Fluoroscopic  FLUOROSCOPY TIME:  3 min 12 seconds  MEDICATIONS AND MEDICAL HISTORY: 1 g Rocephin administered within 1 hr of the procedure, 1 mg Versed, 50 mcg fentanyl  ANESTHESIA/SEDATION: 13 min  CONTRAST:  20 cc  COMPLICATIONS: No immediate  PROCEDURE: Informed consent was obtained from the patient following explanation of the procedure, risks, benefits and alternatives. The patient understands, agrees and consents for the procedure. All questions were addressed. A time out was performed.  Maximal barrier sterile technique utilized including caps, mask, sterile gowns, sterile gloves, large sterile drape, hand hygiene, and Betadine.  Right: Initially, under sterile conditions and local anesthesia, the existing right nephrostomy catheter was cut and removed over a guidewire. A Kumpe catheter was advanced into the right collecting system. Several attempts were made with a Kumpe catheter and areas guidewires however access through the right UPJ obstruction could not be obtained. 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 This is not changed since the prior study. Therefore over guidewire, a right 10 [place] external nephrostomy was replaced. Retention loop formed in the renal pelvis. Contrast injection confirms position. Right nephrostomy re- secured with a Prolene suture and connected to external gravity bag. Sterile dressing applied. No immediate complication.  Left: Under sterile conditions, the existing left external nephrostomy catheter was injected with contrast for antegrade nephrostogram. This confirms patency of the left internal ureteral stent in good position. Therefore, the left nephrostomy  catheter was cut and removed without difficulty over a guidewire. Patient tolerated the procedure well. No immediate complication.  IMPRESSION: Unsuccessful attempt at right ureteral internalization (second attempt).  Fluoroscopic replacement of the 10 French right external nephrostomy  Antegrade left nephrostogram confirms patency of the left ureteral stent. Therefore the left nephrostomy catheter was removed without difficulty.   Electronically Signed   By: Daryll Brod M.D.   On: 05/01/2013 13:07   Ir Nephrostogram Right  04/24/2013   CLINICAL DATA:  History of bladder cancer, post bilateral percutaneous nephrostomy catheter placement (03/24/2021. Please attempt internalization of the bilateral percutaneous nephrostomy catheters.  EXAM: 1. BILATERAL NEPHROSTOGRAM. 2. LEFT SIDED URETERAL STENT PLACEMENT 3. BILATERAL PERCUTANEOUS NEPHROSTOMY CATHETER EXCHANGE  COMPARISON:  IR US GUIDE BX ASP/DRAIN dated 03/24/2013; CT ABD/PELV WO CM dated 03/09/2013  MEDICATIONS: Rocephin 1 g IV; The antibiotics were administered within 1 hour of the procedure.  ANESTHESIA/SEDATION: Versed 1 mg IV; Fentanyl 150 mcg IV  Total Moderate Sedation Time  45 minutes.  CONTRAST:  A total of 40 cc Omnipaque 300 was administered into both bilateral renal collecting systems  FLUOROSCOPY TIME:  9 minutes; 30 seconds.  COMPLICATIONS: None immediate  PROCEDURE: The procedure, risks, benefits, and alternatives were explained to the patient. Questions regarding the procedure were encouraged and answered. The patient understands and consents to the procedure.  The nephrostomy tubes and surrounding skin were  prepped with Betadine in a sterile fashion, and a sterile drape was applied covering the operative field. A sterile gown and sterile gloves were used for the procedure. Local anesthesia was provided with 1% Lidocaine.  The preexisting left-sided nephrostomy tube was injected with contrast material. Fluoroscopic spot images were obtained of the  collecting system and ureter to the level of the urinary bladder. The existing nephrostomy catheter was cut and cannulated with a Benson wire. Under intermittent fluoroscopic guidance, the existing nephrostomy catheter was exchanged for an 9-French vascular sheath which was advanced into the renal pelvis and utilized to advance an additional Benson wire into the renal collecting system to be used as a Chiropodist. The vascular sheath was exchanged for a Kumpe catheter which was manipulated to the level of the urinary bladder with the use of a regular Glidewire.  The Kumpe catheter was utilized to estimate to appropriate length of the ureteral stent. An 8-French, 22 cm ureteral stent was then advanced over an Amplatz superstiff guidewire. The distal portion was formed at the level of the bladder. Proximal portion was then formed at the level of the renal collecting system.  At this point, the safety wire was utilized to replace an existing 10.2 [place] percutaneous nephrostomy catheter within the renal collecting system. The left-sided percutaneous nephrostomy catheter was capped and secured to the skin within interrupted suture. Postprocedural images were obtained in various obliquities.  Attention was now paid towards right-sided antegrade nephrostogram and potential right-sided double-J ureteral catheter placement. The existing right-sided nephrostomy catheter was injected with a small amount of contrast. Under intermittent fluoroscopic guidance, the existing nephrostomy catheter was exchanged for a 9 French vascular sheath. Contrast was injected via the existing sheath however failed to demonstrate opacification of the superior aspect of the right ureter. A Kumpe the catheter was manipulated into the caudal aspect of the right renal pelvis and a regular glidewire was utilized 2 probes the caudal aspect of the right renal pelvis however again, there is no opacification of the superior aspect of the right ureter. As  such, under intermittent fluoroscopic guidance, the vascular sheath was exchanged for a new 10 French percutaneous nephrostomy catheter whose end was coiled and locked within the right renal pelvis. A small amount of contrast was injected via the existing nephrostomy catheter and a spot fluoroscopic image was obtained. The right-sided percutaneous nephrostomy catheter was connected to a gravity bag and secured at the skin within interrupted suture.  Both percutaneous nephrostomy catheters were capped and sutured in place. Dressings were placed. The patient tolerated procedure well without immediate postprocedural complication.  FINDINGS: Nephrostogram demonstrates appropriate positioning and functioning of the existing left-sided percutaneous nephrostomy catheter. A very diminutive left ureter was ultimately opacified and ultimately allowed placement of a 22 cm double-J ureteral stent with superior coil within the left renal pelvis and inferior coil within the urinary bladder. Given the marked narrowing of the superior aspect of the left ureter, the decision was made to replace the left-sided percutaneous nephrostomy catheter to maintain continued percutaneous access to the left kidney as the trial of internalization was performed with capped the left-sided percutaneous nephrostomy.  Initial right-sided nephrostogram demonstrates appropriate positioning and functioning of the existing right-sided percutaneous nephrostomy catheter, however there is persistent marked dilatation of the right renal pelvis and blunting of the renal calyces. Despite injection of a moderate to large volume of contrast into the right renal pelvis including selective injection from the caudal aspect of the left renal pelvis, the  UPJ and superior aspect of the right ureter was never opacified. Prolonged efforts were made to cannulate the suspected location of the superior aspect of the right ureter however this ultimately proved unsuccessful.  As such, a new left-sided percutaneous nephrostomy catheter was coiled and locked within left renal pelvis.  IMPRESSION: 1. Successful placement of left-sided 22 cm ureteral stent. 2. Successful exchange of left-sided 10 French nephrostomy catheter to maintain continued percutaneous access to the left kidney. This catheter will be capped for a trial of attempted internalization. 3. Unsuccessful attempted right-sided percutaneous ureteral stent placement secondary to non opacification of the superior aspect of the right ureter. 4. Successful fluoroscopic guided exchange of right-sided 10.2 French nephrostomy catheter.  PLAN: The patient will return early next week for left-sided antegrade nephrostogram and potential left-sided percutaneous nephrostomy catheter removal. Note, the patient was given an extra gravity bag and educated to connect the left-sided percutaneous nephrostomy catheter in the setting of the onset of left-sided flank pain, fever or chills.  During next week's left-sided antegrade nephrostogram, an additional attempt will be made at placing a right-sided double-J ureteral stent.   Electronically Signed   By: Sandi Mariscal M.D.   On: 04/24/2013 14:44   Ir Nephro Tube Remov/fl  05/01/2013   CLINICAL DATA:  Bladder cancer, retroperitoneal mass, bilateral ureteral obstructions, recently placed left ureteral stent and bilateral nephrostomies  EXAM: BILATERAL ANTEGRADE NEPHROSTOGRAMS  LEFT NEPHROSTOMY REMOVAL  RIGHT NEPHROSTOMY EXCHANGE AFTER FAILED ATTEMPT AT RIGHT URETERAL INTERNALIZATION (second attempt)  Date:  4/13/20154/13/2015 11:32 AM  Radiologist:  Jerilynn Mages. Daryll Brod, MD  Guidance:  Fluoroscopic  FLUOROSCOPY TIME:  3 min 12 seconds  MEDICATIONS AND MEDICAL HISTORY: 1 g Rocephin administered within 1 hr of the procedure, 1 mg Versed, 50 mcg fentanyl  ANESTHESIA/SEDATION: 13 min  CONTRAST:  20 cc  COMPLICATIONS: No immediate  PROCEDURE: Informed consent was obtained from the patient following  explanation of the procedure, risks, benefits and alternatives. The patient understands, agrees and consents for the procedure. All questions were addressed. A time out was performed.  Maximal barrier sterile technique utilized including caps, mask, sterile gowns, sterile gloves, large sterile drape, hand hygiene, and Betadine.  Right: Initially, under sterile conditions and local anesthesia, the existing right nephrostomy catheter was cut and removed over a guidewire. A Kumpe catheter was advanced into the right collecting system. Several attempts were made with a Kumpe catheter and areas guidewires however access through the right UPJ obstruction could not be obtained. 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 This is not changed since the prior study. Therefore over guidewire, a right 10 [place] external nephrostomy was replaced. Retention loop formed in the renal pelvis. Contrast injection confirms position. Right nephrostomy re- secured with a Prolene suture and connected to external gravity bag. Sterile dressing applied. No immediate complication.  Left: Under sterile conditions, the existing left external nephrostomy catheter was injected with contrast for antegrade nephrostogram. This confirms patency of the left internal ureteral stent in good position. Therefore, the left nephrostomy catheter was cut and removed without difficulty over a guidewire. Patient tolerated the procedure well. No immediate complication.  IMPRESSION: Unsuccessful attempt at right ureteral internalization (second attempt).  Fluoroscopic replacement of the 10 French right external nephrostomy  Antegrade left nephrostogram confirms patency of the left ureteral stent. Therefore the left nephrostomy catheter was removed without difficulty.   Electronically Signed   By: Daryll Brod M.D.   On: 05/01/2013 13:07   Ct Biopsy  04/24/2013  INDICATION: History of bladder cancer, now with shotty retroperitoneal lymph  nodes worrisome for metastatic disease. Please perform CT guided biopsy of dominant retroperitoneal lymph node for tissue diagnostic purposes.  EXAM: CT BIOPSY OF LEFT SIDED RETROPERITONEAL LYMPH NODE  COMPARISON:  CT ABD/PELV WO CM dated 03/09/2013  MEDICATIONS: Fentanyl 50 mcg IV; Versed 1 mg IV; Benadryl 25 mg IV  ANESTHESIA/SEDATION: Sedation time  15 minutes  CONTRAST:  None  COMPLICATIONS: None immediate  PROCEDURE: Informed consent was obtained from the patient following an explanation of the procedure, risks, benefits and alternatives. A time out was performed prior to the initiation of the procedure.  The patient was positioned prone on the CT table and a limited CT was performed for procedural planning demonstrating grossly unchanged appearance of shotty retroperitoneal lymph nodes with dominant retroperitoneal lymph node measuring approximately 0.9 cm in greatest short axis diameter (image 5, series 4). The procedure was planned. The operative site was prepped and draped in the usual sterile fashion. Appropriate trajectory was confirmed with a 22 gauge spinal needle after the adjacent tissues were anesthetized with 1% Lidocaine with epinephrine.  Under intermittent CT guidance, a 17 gauge coaxial needle was advanced into the peripheral aspect of the mass. Appropriate positioning was confirmed and 2 core needle biopsies were obtained with an 18 gauge core needle biopsy device. The co-axial needle was removed and hemostasis was achieved with manual compression.  A limited postprocedural CT was negative for hemorrhage or additional complication. A dressing was placed. The patient tolerated the procedure well without immediate postprocedural complication.  IMPRESSION: Technically successful CT guided core needle biopsy of dominant left-sided retroperitoneal lymph node.   Electronically Signed   By: Sandi Mariscal M.D.   On: 04/24/2013 14:03   Dg Chest Port 1 View  05/22/2013   CLINICAL DATA:  Week, lethargy   EXAM: PORTABLE CHEST - 1 VIEW  COMPARISON:  DG CHEST 1V PORT dated 03/31/2013; DG CHEST 2 VIEW dated 03/03/2013  FINDINGS: There are bilateral mild chronic interstitial changes. There is superimposed bilateral patchy interstitial and alveolar airspace opacities. There is relatively increased patchy There is no pleural effusion or pneumothorax. The heart and mediastinal contours are unremarkable.  The osseous structures are unremarkable.  IMPRESSION: Bilateral patchy interstitial and alveolar airspace opacities superimposed upon mild chronic changes. The changes may reflect mild pulmonary edema versus multi lobar pneumonia.  Chronic interstitial lung disease.   Electronically Signed   By: Kathreen Devoid   On: 05/22/2013 01:00   Ir Oris Drone Cath Perc Left  04/24/2013   CLINICAL DATA:  History of bladder cancer, post bilateral percutaneous nephrostomy catheter placement (03/24/2021. Please attempt internalization of the bilateral percutaneous nephrostomy catheters.  EXAM: 1. BILATERAL NEPHROSTOGRAM. 2. LEFT SIDED URETERAL STENT PLACEMENT 3. BILATERAL PERCUTANEOUS NEPHROSTOMY CATHETER EXCHANGE  COMPARISON:  IR US GUIDE BX ASP/DRAIN dated 03/24/2013; CT ABD/PELV WO CM dated 03/09/2013  MEDICATIONS: Rocephin 1 g IV; The antibiotics were administered within 1 hour of the procedure.  ANESTHESIA/SEDATION: Versed 1 mg IV; Fentanyl 150 mcg IV  Total Moderate Sedation Time  45 minutes.  CONTRAST:  A total of 40 cc Omnipaque 300 was administered into both bilateral renal collecting systems  FLUOROSCOPY TIME:  9 minutes; 30 seconds.  COMPLICATIONS: None immediate  PROCEDURE: The procedure, risks, benefits, and alternatives were explained to the patient. Questions regarding the procedure were encouraged and answered. The patient understands and consents to the procedure.  The nephrostomy tubes and surrounding skin were prepped with Betadine in  a sterile fashion, and a sterile drape was applied covering the operative field. A sterile  gown and sterile gloves were used for the procedure. Local anesthesia was provided with 1% Lidocaine.  The preexisting left-sided nephrostomy tube was injected with contrast material. Fluoroscopic spot images were obtained of the collecting system and ureter to the level of the urinary bladder. The existing nephrostomy catheter was cut and cannulated with a Benson wire. Under intermittent fluoroscopic guidance, the existing nephrostomy catheter was exchanged for an 9-French vascular sheath which was advanced into the renal pelvis and utilized to advance an additional Benson wire into the renal collecting system to be used as a Chiropodist. The vascular sheath was exchanged for a Kumpe catheter which was manipulated to the level of the urinary bladder with the use of a regular Glidewire.  The Kumpe catheter was utilized to estimate to appropriate length of the ureteral stent. An 8-French, 22 cm ureteral stent was then advanced over an Amplatz superstiff guidewire. The distal portion was formed at the level of the bladder. Proximal portion was then formed at the level of the renal collecting system.  At this point, the safety wire was utilized to replace an existing 10.2 [place] percutaneous nephrostomy catheter within the renal collecting system. The left-sided percutaneous nephrostomy catheter was capped and secured to the skin within interrupted suture. Postprocedural images were obtained in various obliquities.  Attention was now paid towards right-sided antegrade nephrostogram and potential right-sided double-J ureteral catheter placement. The existing right-sided nephrostomy catheter was injected with a small amount of contrast. Under intermittent fluoroscopic guidance, the existing nephrostomy catheter was exchanged for a 9 French vascular sheath. Contrast was injected via the existing sheath however failed to demonstrate opacification of the superior aspect of the right ureter. A Kumpe the catheter was  manipulated into the caudal aspect of the right renal pelvis and a regular glidewire was utilized 2 probes the caudal aspect of the right renal pelvis however again, there is no opacification of the superior aspect of the right ureter. As such, under intermittent fluoroscopic guidance, the vascular sheath was exchanged for a new 10 French percutaneous nephrostomy catheter whose end was coiled and locked within the right renal pelvis. A small amount of contrast was injected via the existing nephrostomy catheter and a spot fluoroscopic image was obtained. The right-sided percutaneous nephrostomy catheter was connected to a gravity bag and secured at the skin within interrupted suture.  Both percutaneous nephrostomy catheters were capped and sutured in place. Dressings were placed. The patient tolerated procedure well without immediate postprocedural complication.  FINDINGS: Nephrostogram demonstrates appropriate positioning and functioning of the existing left-sided percutaneous nephrostomy catheter. A very diminutive left ureter was ultimately opacified and ultimately allowed placement of a 22 cm double-J ureteral stent with superior coil within the left renal pelvis and inferior coil within the urinary bladder. Given the marked narrowing of the superior aspect of the left ureter, the decision was made to replace the left-sided percutaneous nephrostomy catheter to maintain continued percutaneous access to the left kidney as the trial of internalization was performed with capped the left-sided percutaneous nephrostomy.  Initial right-sided nephrostogram demonstrates appropriate positioning and functioning of the existing right-sided percutaneous nephrostomy catheter, however there is persistent marked dilatation of the right renal pelvis and blunting of the renal calyces. Despite injection of a moderate to large volume of contrast into the right renal pelvis including selective injection from the caudal aspect of the  left renal pelvis, the UPJ and superior  aspect of the right ureter was never opacified. Prolonged efforts were made to cannulate the suspected location of the superior aspect of the right ureter however this ultimately proved unsuccessful. As such, a new left-sided percutaneous nephrostomy catheter was coiled and locked within left renal pelvis.  IMPRESSION: 1. Successful placement of left-sided 22 cm ureteral stent. 2. Successful exchange of left-sided 10 French nephrostomy catheter to maintain continued percutaneous access to the left kidney. This catheter will be capped for a trial of attempted internalization. 3. Unsuccessful attempted right-sided percutaneous ureteral stent placement secondary to non opacification of the superior aspect of the right ureter. 4. Successful fluoroscopic guided exchange of right-sided 10.2 French nephrostomy catheter.  PLAN: The patient will return early next week for left-sided antegrade nephrostogram and potential left-sided percutaneous nephrostomy catheter removal. Note, the patient was given an extra gravity bag and educated to connect the left-sided percutaneous nephrostomy catheter in the setting of the onset of left-sided flank pain, fever or chills.  During next week's left-sided antegrade nephrostogram, an additional attempt will be made at placing a right-sided double-J ureteral stent.   Electronically Signed   By: Sandi Mariscal M.D.   On: 04/24/2013 14:44     Microbiology: Recent Results (from the past 240 hour(s))  MRSA PCR SCREENING     Status: Abnormal   Collection Time    05/22/13  4:00 AM      Result Value Ref Range Status   MRSA by PCR POSITIVE (*) NEGATIVE Final   Comment:            The GeneXpert MRSA Assay (FDA     approved for NASAL specimens     only), is one component of a     comprehensive MRSA colonization     surveillance program. It is not     intended to diagnose MRSA     infection nor to guide or     monitor treatment for     MRSA  infections.     RESULT CALLED TO, READ BACK BY AND VERIFIED WITH:     K. [facility] RN AT [id number] ON 05.04.15 BY SHUEA     Labs: Basic Metabolic Panel:  Recent Labs Lab 05/22/13 0015 05/22/13 0445  NA 130* 132*  K 5.0 5.2  CL 89* 91*  CO2 27 31  GLUCOSE 208* 187*  BUN 59* 56*  CREATININE 2.22* 2.20*  CALCIUM 9.7 9.4   Liver Function Tests:  Recent Labs Lab 05/22/13 0015 05/22/13 0445  AST 19 20  ALT 15 13  ALKPHOS 338* 316*  BILITOT 0.4 0.4  PROT 7.5 7.2  ALBUMIN 2.2* 2.0*   No results found for this basename: LIPASE, AMYLASE,  in the last 168 hours No results found for this basename: AMMONIA,  in the last 168 hours CBC:  Recent Labs Lab 05/22/13 0015 05/22/13 0445  WBC 10.5 8.9  NEUTROABS 7.3  --   HGB 11.7* 11.0*  HCT 37.7 35.4*  MCV 81.4 81.2  PLT 326 313   Cardiac Enzymes:  Recent Labs Lab 05/22/13 0015  TROPONINI <0.30   BNP: No components found with this basename: POCBNP,  CBG:  Recent Labs Lab 05/22/13 1120 05/22/13 1144 05/22/13 1200 05/22/13 1222 05/22/13 1253  GLUCAP 66* 63* 65* 54* 80    Time coordinating discharge:  Greater than 30 minutes  Signed:  Orson Eva, DO [facility] Pager: [phone] 05/23/2013, 9:14 AM

## 2013-05-22 NOTE — Telephone Encounter (Signed)
I will ask Dr Dorien Chihuahua on 05-23-13.

## 2013-05-22 NOTE — ED Notes (Signed)
Patient transported to CT 

## 2013-05-23 ENCOUNTER — Ambulatory Visit (INDEPENDENT_AMBULATORY_CARE_PROVIDER_SITE_OTHER): Payer: BC Managed Care – PPO | Admitting: Internal Medicine

## 2013-05-23 DIAGNOSIS — G893 Neoplasm related pain (acute) (chronic): Secondary | ICD-10-CM

## 2013-05-23 DIAGNOSIS — R5381 Other malaise: Secondary | ICD-10-CM

## 2013-05-23 DIAGNOSIS — D494 Neoplasm of unspecified behavior of bladder: Secondary | ICD-10-CM

## 2013-05-23 DIAGNOSIS — R5383 Other fatigue: Secondary | ICD-10-CM

## 2013-05-23 DIAGNOSIS — Z515 Encounter for palliative care: Secondary | ICD-10-CM

## 2013-05-23 DIAGNOSIS — R531 Weakness: Secondary | ICD-10-CM

## 2013-05-23 MED ORDER — DSS 100 MG PO CAPS: 100.0000 mg | ORAL_CAPSULE | Freq: Two times a day (BID) | ORAL | Status: AC

## 2013-05-23 MED ORDER — MORPHINE SULFATE (CONCENTRATE) 10 MG /0.5 ML PO SOLN: 5.0000 mg | ORAL | Status: AC | PRN

## 2013-05-23 NOTE — Progress Notes (Signed)
Nutrition Brief Note  Patient identified on Malnutrition Screening Tool Chart reviewed. Pt now transitioning to comfort care.  No further nutrition interventions warranted at this time.  Please re-consult as needed.   Donzell Coller F Doyel Mulkern MS RD LDN Clinical Dietitian Pager:[phone]    

## 2013-05-23 NOTE — Telephone Encounter (Signed)
I have called and spoke with [facility]. Dr.Rehman will sign as her PCP.

## 2013-05-23 NOTE — Progress Notes (Signed)
Patient given discharge instructions, and verbalized an understanding of all discharge instructions.  Patient agrees with discharge plan, and is being discharged in stable medical condition.  Patient given transportation via wheelchair.  Rishikesh Khachatryan RN 

## 2013-05-23 NOTE — Consult Note (Signed)
I have reviewed this case with our NP and agree with the Assessment and Plan as stated.  Josede Cicero L. Ketzaly Cardella, MD MBA The Palliative Medicine Team at  Team Phone: [phone] Pager: [phone]   

## 2013-06-19 DEATH — deceased

## 2013-06-26 ENCOUNTER — Other Ambulatory Visit (HOSPITAL_COMMUNITY): Payer: BC Managed Care – PPO

## 2013-07-22 ENCOUNTER — Other Ambulatory Visit (INDEPENDENT_AMBULATORY_CARE_PROVIDER_SITE_OTHER): Payer: Self-pay | Admitting: Internal Medicine

## 2013-07-25 ENCOUNTER — Other Ambulatory Visit (HOSPITAL_COMMUNITY): Payer: BC Managed Care – PPO

## 2013-07-26 ENCOUNTER — Ambulatory Visit (HOSPITAL_COMMUNITY): Payer: BC Managed Care – PPO

## 2013-07-27 NOTE — Progress Notes (Signed)
This encounter was created in error - please disregard.

## 2015-04-06 IMAGING — US IR US GUIDANCE
1 series · 4 of 4 positions shown · non-contrast
Comparison: none

CLINICAL DATA: 72-year-old female with malignant obstructed
uropathy. She is currently admitted with acute on chronic renal
failure which is thought to be exacerbated by the underlying
obstructed uropathy. Bilateral percutaneous nephrostomy tubes are
warranted for decompression in an effort to salvage renal function.
She was anticoagulated on Coumadin at her initial presentation. She
has so far received 3 units of FFP and her INR is currently 1.6. A
4th unit of FFP is currently running and will be continued
throughout the procedure.

EXAM:
IR ULTRASOUND GUIDANCE TISSUE ABLATION; PERC NEPHROSTOMY*L*; PERC
NEPHROSTOMY*R*
Date: 03/24/2013
TECHNIQUE: Informed consent was obtained from the patient following explanation
of the procedure, risks, benefits and alternatives. The patient
understands, agrees and consents for the procedure. All questions
were addressed. A time out was performed.

[Series 1: ir perc nephrostomy*left* · 4 of 4 slices shown]
[im 1/4]
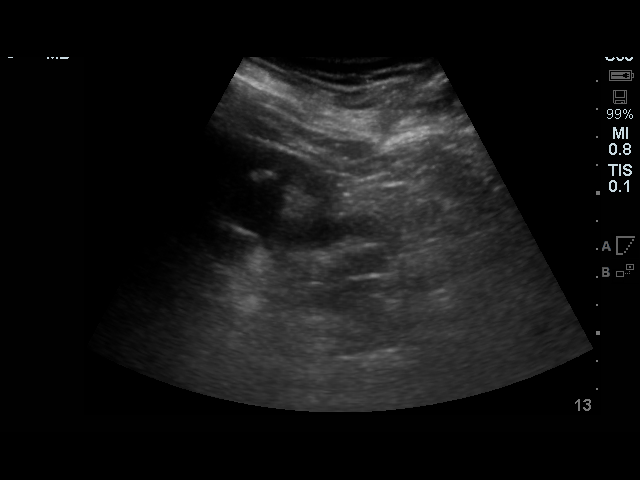
[im 2/4]
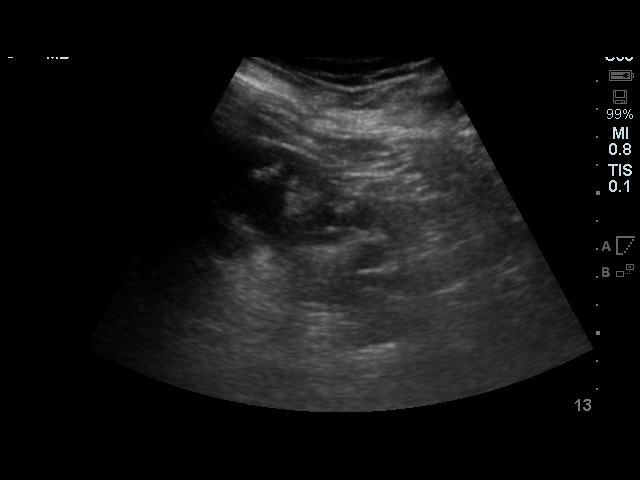
[im 3/4]
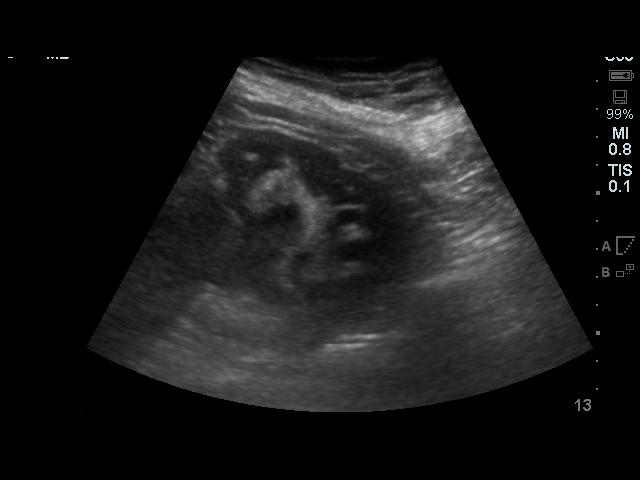
[im 4/4]
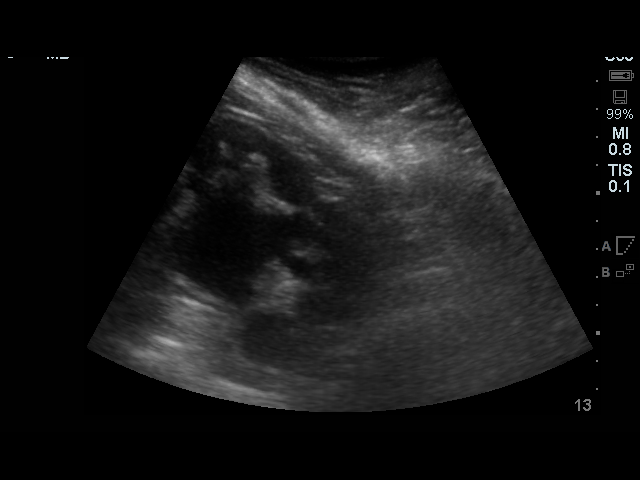

[4 of 4 positions shown; findings below may reference images not displayed]

Maximal barrier sterile technique utilized including caps, mask,
sterile gowns, sterile gloves, large sterile drape, hand hygiene,
and Betadine skin prep.

The left flank was interrogated with ultrasound. The hydronephrotic
left kidney was successfully identified. An appropriate skin entry
site overlying a posterior calyx was identified and marked. Local
anesthesia was attained by infiltration with 1% lidocaine. A small
dermatotomy was made. Under real-time sonographic guidance, a 21
gauge Accustick needle was used to puncture a posterior interpolar
calyx. Clear urine returned through the needle. An image was
obtained and stored for the medical record. The 0.018 inch wire was
advanced into the renal collecting system. The Accustick sheath was
then advanced over the wire and positioned within the renal pelvis.
Position was confirmed with a gentle hand injection of contrast
material. A Rosen wire was advanced into the renal pelvis. The tract
was then subsequently dilated to 10 French and [place] Juzgado 10 French
all-purpose drain advanced over the wire and positioned with the
locking loop in the renal pelvis. A final image was obtained and
stored. The tube was secured to the skin with 0 Prolene suture and a
plastic bumper. The tube was placed to gravity drainage. The patient
tolerated the procedure well.

Attention was next turned to the right flank. The right flank was
interrogated with ultrasound. The hydronephrotic right kidney was
successfully identified. An appropriate skin entry site overlying a
posterior caval ex was identified and marked. Local anesthesia was
attained by infiltration with 1% lidocaine. A small dermatotomy was
made. Under real-time sonographic guidance, a 21 gauge Accustick
needle was used to puncture a posterior lower pole calyx. Clear
urine returned through the needle. The 0.018 inch wire was advanced
into the renal pelvis. The Accustick needle was exchanged for the
Accustick sheath. Position within the renal collecting system was
confirmed by gentle hand injection of contrast material. A J-wire
was advanced through the Accustick sheath and coiled within the
pelvis. The Accustick sheath was removed. The skin tract was dilated
to 10 French and [place] Juzgado 10 French all-purpose drainage catheter was
advanced over the wire and positioned with the locking loop in the
renal pelvis. The tube was secured to the skin with 0 Prolene suture
and connected to gravity bag drainage. The patient tolerated the
procedure well without evidence of complication.

ANESTHESIA/SEDATION:
Moderate (conscious) sedation was used. 0.5 mg Versed, 25 mcg
Fentanyl were administered intravenously. The patient's vital signs
were monitored continuously by radiology nursing throughout the
procedure. 25 mg of Benadryl was also administered intravenously an
addition to 2 g Ancef within 1 hr of skin incision.

Sedation Time: 13 minutes

CONTRAST:  10 cc Omnipaque 300

FLUOROSCOPY TIME:  54 seconds

PROCEDURE:
1. Ultrasound-guided puncture of the left renal collecting system
2. Placement of a 10 French percutaneous nephrostomy tube under
fluoroscopic guidance
3. Ultrasound guided puncture of the right renal collecting system
4. Placement of a 10 French percutaneous nephrostomy tube under
fluoroscopic guidance
IMPRESSION: Successful placement of bilateral 10 French percutaneous nephrostomy
tubes.

[REDACTED] in 4 weeks for routine bilateral
tube check and change. At that time, an attempt could be made to
internalize to double-J ureteral stents, or nephro ureteral stents.
